# Patient Record
Sex: Male | Born: 1960 | Race: White | Hispanic: No | Marital: Single
Health system: Midwestern US, Community
[De-identification: ages and names within clinical notes are randomized; demographics above are authoritative.]

## PROBLEM LIST (undated history)

## (undated) DIAGNOSIS — M4800 Spinal stenosis, site unspecified: Secondary | ICD-10-CM

## (undated) DIAGNOSIS — G4730 Sleep apnea, unspecified: Secondary | ICD-10-CM

## (undated) DIAGNOSIS — M549 Dorsalgia, unspecified: Secondary | ICD-10-CM

## (undated) DIAGNOSIS — G8929 Other chronic pain: Secondary | ICD-10-CM

## (undated) DIAGNOSIS — B1920 Unspecified viral hepatitis C without hepatic coma: Secondary | ICD-10-CM

## (undated) DIAGNOSIS — I10 Essential (primary) hypertension: Secondary | ICD-10-CM

## (undated) DIAGNOSIS — F419 Anxiety disorder, unspecified: Secondary | ICD-10-CM

## (undated) DIAGNOSIS — E119 Type 2 diabetes mellitus without complications: Secondary | ICD-10-CM

## (undated) DIAGNOSIS — M419 Scoliosis, unspecified: Secondary | ICD-10-CM

## (undated) DIAGNOSIS — M5430 Sciatica, unspecified side: Secondary | ICD-10-CM

## (undated) DIAGNOSIS — M25519 Pain in unspecified shoulder: Secondary | ICD-10-CM

## (undated) HISTORY — DX: Type 2 diabetes mellitus without complications: E11.9

## (undated) HISTORY — DX: Essential (primary) hypertension: I10

## (undated) HISTORY — PX: KIDNEY STONE SURGERY: SHX686

## (undated) HISTORY — DX: Unspecified viral hepatitis C without hepatic coma: B19.20

---

## 2001-06-24 ENCOUNTER — Emergency Department (HOSPITAL_COMMUNITY): Admission: EM | Admit: 2001-06-24 | Discharge: 2001-06-24 | Payer: Self-pay | Admitting: Emergency Medicine

## 2001-07-14 ENCOUNTER — Emergency Department (HOSPITAL_COMMUNITY): Admission: EM | Admit: 2001-07-14 | Discharge: 2001-07-14 | Payer: Self-pay | Admitting: *Deleted

## 2004-05-18 ENCOUNTER — Inpatient Hospital Stay (HOSPITAL_COMMUNITY): Admission: EM | Admit: 2004-05-18 | Discharge: 2004-05-20 | Payer: Self-pay | Admitting: Cardiology

## 2005-03-22 ENCOUNTER — Emergency Department (HOSPITAL_COMMUNITY): Admission: EM | Admit: 2005-03-22 | Discharge: 2005-03-23 | Payer: Self-pay | Admitting: Emergency Medicine

## 2005-08-06 ENCOUNTER — Inpatient Hospital Stay (HOSPITAL_COMMUNITY): Admission: AD | Admit: 2005-08-06 | Discharge: 2005-08-07 | Payer: Self-pay | Admitting: Family Medicine

## 2012-02-26 ENCOUNTER — Ambulatory Visit (HOSPITAL_COMMUNITY)
Admission: RE | Admit: 2012-02-26 | Discharge: 2012-02-26 | Disposition: A | Discharge status: Home / Self Care | Payer: PRIVATE HEALTH INSURANCE | Source: Ambulatory Visit | Attending: Family Medicine | Admitting: Family Medicine

## 2012-02-26 ENCOUNTER — Other Ambulatory Visit (HOSPITAL_COMMUNITY): Payer: Self-pay | Admitting: Family Medicine

## 2012-02-26 DIAGNOSIS — R5381 Other malaise: Secondary | ICD-10-CM | POA: Insufficient documentation

## 2012-02-26 DIAGNOSIS — R5383 Other fatigue: Secondary | ICD-10-CM | POA: Insufficient documentation

## 2012-02-26 DIAGNOSIS — R0602 Shortness of breath: Secondary | ICD-10-CM | POA: Insufficient documentation

## 2012-02-26 DIAGNOSIS — I10 Essential (primary) hypertension: Secondary | ICD-10-CM | POA: Insufficient documentation

## 2012-02-26 DIAGNOSIS — E119 Type 2 diabetes mellitus without complications: Secondary | ICD-10-CM | POA: Insufficient documentation

## 2012-04-10 ENCOUNTER — Encounter (INDEPENDENT_AMBULATORY_CARE_PROVIDER_SITE_OTHER): Payer: Self-pay | Admitting: *Deleted

## 2012-05-05 ENCOUNTER — Ambulatory Visit (INDEPENDENT_AMBULATORY_CARE_PROVIDER_SITE_OTHER): Payer: PRIVATE HEALTH INSURANCE | Admitting: Internal Medicine

## 2012-05-05 ENCOUNTER — Encounter (INDEPENDENT_AMBULATORY_CARE_PROVIDER_SITE_OTHER): Payer: Self-pay | Admitting: Internal Medicine

## 2012-05-05 VITALS — BP 140/90 | HR 80 | Temp 97.3°F | Resp 20 | Ht 70.0 in | Wt 257.9 lb

## 2012-05-05 DIAGNOSIS — B182 Chronic viral hepatitis C: Secondary | ICD-10-CM

## 2012-05-05 DIAGNOSIS — E119 Type 2 diabetes mellitus without complications: Secondary | ICD-10-CM | POA: Insufficient documentation

## 2012-05-05 DIAGNOSIS — E669 Obesity, unspecified: Secondary | ICD-10-CM

## 2012-05-05 DIAGNOSIS — G8929 Other chronic pain: Secondary | ICD-10-CM | POA: Insufficient documentation

## 2012-05-05 DIAGNOSIS — I10 Essential (primary) hypertension: Secondary | ICD-10-CM | POA: Insufficient documentation

## 2012-05-05 NOTE — Patient Instructions (Addendum)
Take naproxen on as-needed basis. Physician will contact you when ultrasound completed. You must exercise regularly and try to lose weight at a rate of 2-3 pounds per month. Try to bring your weight down to 220 pounds Dr. Sudie Bailey to arrange hepatitis A and B. Vaccination.

## 2012-05-05 NOTE — Progress Notes (Signed)
Presenting complaint;  Chronic hepatitis C. Patient interested in further evaluation and treatment.  History of present illness;  Gregory Duran is 51 year old Caucasian male who is referred through courtesy of Dr. Sudie Bailey for evaluation of chronic hepatitis C. This condition was diagnosed about 6 years ago when he was in rehabilitation for alcohol abuse. He is never experienced jaundice. He had single tattoo placed over his right palm at age 33. He also used drugs over a period of 6 months and he was in early 83s. He feels fine. He has very good appetite. He has gained 10 pounds in the last 5 months since he started his new job. He denies fatigue or weakness. He has intermittent gnawing abdominal pain which she describes as hunger pain. He denies nausea, vomiting heartburn or melena. He has occasional hematochezia secondary to hemorrhoids. He is presently not sexually active. He states he loves new job and works 6-7 days each week. He has some concerns about cost of treatment since his deductible is $4000.   Current Medications: Current Outpatient Prescriptions  Medication Sig Dispense Refill  . aspirin 81 MG tablet Take 81 mg by mouth daily.      Marland Kitchen glipiZIDE (GLUCOTROL) 10 MG tablet Take 10 mg by mouth. 1/2 tablet by mouth twice  daily      . lisinopril-hydrochlorothiazide (PRINZIDE,ZESTORETIC) 20-12.5 MG per tablet Take 1 tablet by mouth daily.      . Multiple Vitamins-Minerals (GNP MEGA MULTI FOR MEN PO) Take by mouth. Patient takes 2 a day      . naproxen (NAPROSYN) 500 MG tablet Take 500 mg by mouth 2 (two) times daily with a meal.      . oxyCODONE-acetaminophen (PERCOCET) 7.5-325 MG per tablet Take 2 tablets by mouth daily as needed.      Marland Kitchen POTASSIUM GLUCONATE PO Take 99 mg by mouth.      . predniSONE (DELTASONE) 10 MG tablet Take 10 mg by mouth as needed.       Past medical history; Hypertensive for 22 years. History of alcohol abuse. He was in rehabilitation few years ago.  Diabetes  mellitus. He's been on medications for 6 years. Obesity. Chronic low back pain/sciatica. History of kidney stone removed via cystoscopy 5 years ago. Allergies; NKA. Family history; Both patients are disease mother died of MI at age 4; father lived to be 5. He had CAD hypertension and glucose intolerance. He has 3 brothers and they're all hypertensive. One sister in good health. Social history; He is divorced. He does not have any children. He drank sessile amount of alcohol for years but none in last 3-1/2 years. He does not smoke cigarettes. 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   Objective: Blood pressure 140/90, pulse 80, temperature 97.3 F (36.3 C), temperature source Oral, resp. rate 20, height 5\' 10"  (1.778 m), weight 257 lb 14.4 oz (116.983 kg). Patient is alert and in no acute distress. He does not have asterixis. Conjunctiva is pink. Sclera is nonicteric Oropharyngeal mucosa is normal. No neck masses or thyromegaly noted. Cardiac exam with regular rhythm normal S1 and S2. No murmur or gallop noted. Lungs are clear to auscultation. Abdomen is obese. Abdomen is soft and nontender without organomegaly or masses. Liver by percussion is 15-16 cm.  No LE edema or clubbing noted. He has tattoo over his right arm.  Labs/studies Results: Lab data from 02/27/2012. WBC 10.3, H&H 11.8 and 33.8,  platelet count 259K Bilirubin 0.6, AP 129, AST 39, ALT 49 albumin 3.9. BUN 21, glucose 264. Hemoglobin A1c 6.9  Assessment:  Patient is 51 year old Caucasian male who has  chronic hepatitis C. He does not have stigmata of chronic liver disease. His other risk factor for liver disease includes obesity and possibly fatty liver. He needs to improve control of his diabetes and needs to gradually lose weight. He will need liver biopsy prior to initiating antiviral therapy. We also discussed low risk of transmission during  sexual activity and need for mechanical barrier during sexual activity.   Recommendations;   Hepatobiliary ultrasound to be scheduled in November 2013 per patient's request. Patient advised he must make an effort to exercise regularly and lose weight. Patient also advised to use naproxen on as-needed basis. Patient also advised to use condoms for sexual activity and should make his partner aware that he has chronic hepatitis. He will talk with Dr. Sudie Bailey about getting hepatitis A and B. vaccination.

## 2012-06-06 ENCOUNTER — Encounter (INDEPENDENT_AMBULATORY_CARE_PROVIDER_SITE_OTHER): Payer: Self-pay

## 2012-07-29 ENCOUNTER — Encounter (INDEPENDENT_AMBULATORY_CARE_PROVIDER_SITE_OTHER): Payer: Self-pay | Admitting: *Deleted

## 2012-09-17 ENCOUNTER — Telehealth (INDEPENDENT_AMBULATORY_CARE_PROVIDER_SITE_OTHER): Payer: Self-pay | Admitting: *Deleted

## 2012-09-17 NOTE — Telephone Encounter (Signed)
Mr.Mcdanel called and has requested that he be called regarding a U/S that Dr.Rehman wanted him to have done. He may be contacted at [phone]

## 2012-09-18 ENCOUNTER — Other Ambulatory Visit (INDEPENDENT_AMBULATORY_CARE_PROVIDER_SITE_OTHER): Payer: Self-pay | Admitting: Internal Medicine

## 2012-09-18 DIAGNOSIS — B1920 Unspecified viral hepatitis C without hepatic coma: Secondary | ICD-10-CM

## 2012-09-18 NOTE — Telephone Encounter (Signed)
Spoke to patient, ha wanted [place] sch'd at [facility] for financial reasons, [place] sch'd 10/17/12 @ 745, npo after midnight @ [address] location, patient aware

## 2012-10-17 ENCOUNTER — Ambulatory Visit
Admission: RE | Admit: 2012-10-17 | Discharge: 2012-10-17 | Disposition: A | Discharge status: Home / Self Care | Payer: No Typology Code available for payment source | Source: Ambulatory Visit | Attending: Internal Medicine | Admitting: Internal Medicine

## 2012-10-17 DIAGNOSIS — B1920 Unspecified viral hepatitis C without hepatic coma: Secondary | ICD-10-CM

## 2012-11-03 ENCOUNTER — Ambulatory Visit (INDEPENDENT_AMBULATORY_CARE_PROVIDER_SITE_OTHER): Payer: PRIVATE HEALTH INSURANCE | Admitting: Internal Medicine

## 2013-03-24 ENCOUNTER — Encounter (INDEPENDENT_AMBULATORY_CARE_PROVIDER_SITE_OTHER): Payer: Self-pay | Admitting: *Deleted

## 2013-12-18 ENCOUNTER — Emergency Department (INDEPENDENT_AMBULATORY_CARE_PROVIDER_SITE_OTHER)
Admission: EM | Admit: 2013-12-18 | Discharge: 2013-12-18 | Disposition: A | Discharge status: Home / Self Care | Payer: PRIVATE HEALTH INSURANCE | Source: Home / Self Care | Attending: Family Medicine | Admitting: Family Medicine

## 2013-12-18 ENCOUNTER — Encounter (HOSPITAL_COMMUNITY): Payer: Self-pay | Admitting: Emergency Medicine

## 2013-12-18 DIAGNOSIS — M545 Low back pain, unspecified: Secondary | ICD-10-CM

## 2013-12-18 DIAGNOSIS — G8929 Other chronic pain: Secondary | ICD-10-CM

## 2013-12-18 MED ORDER — KETOROLAC TROMETHAMINE 30 MG/ML IJ SOLN
30.0000 mg | Freq: Once | INTRAMUSCULAR | Status: AC
  Administered 2013-12-18: 30 mg via INTRAMUSCULAR

## 2013-12-18 MED ORDER — DICLOFENAC SODIUM 75 MG PO TBEC: 75.0000 mg | DELAYED_RELEASE_TABLET | Freq: Two times a day (BID) | ORAL | Status: DC | PRN

## 2013-12-18 MED ORDER — OXYCODONE-ACETAMINOPHEN 5-325 MG PO TABS: 1.0000 | ORAL_TABLET | Freq: Three times a day (TID) | ORAL | Status: DC | PRN

## 2013-12-18 MED ORDER — KETOROLAC TROMETHAMINE 30 MG/ML IJ SOLN
INTRAMUSCULAR | Status: AC
  Filled 2013-12-18: qty 1

## 2013-12-18 NOTE — ED Notes (Signed)
Pt c/o chronic back pain that started today Hx of sciatic right side pain Pain is constant and increases w/activity PCP in [place], [place].... Has ran out of his oxycodone Voices no other sxs/concerns Alert w/no signs of acute distress.

## 2013-12-18 NOTE — ED Provider Notes (Signed)
CSN: [id number]     Arrival date & time 12/18/13  1444 History   First MD Initiated Contact with Patient 12/18/13 1614     Chief Complaint  Patient presents with  . Back Pain   (Consider location/radiation/quality/duration/timing/severity/associated sxs/prior Treatment) HPI Comments: Patient states he has had chronic lower back pain since 2012 and that this has been managed by his PCP, Dr. Katharine LookSteven Knowlton in [place], [place]. Reports that he has been staying in the [place] area while a family member has been an inpatient at [facility] and he has run out of his percocet that he uses on a daily basis for pain management. [facility] requesting Rx for percocet.  Denies fever, hematuria, dysuria, bowel or bladder incontinence.  States pain in right lower back radiates to his right thigh and foot. No changes in strenght or sensation of either extremity. No saddle anesthesia  The history is provided by the patient.    Past Medical History  Diagnosis Date  . Hepatitis C   . Hypertension   . Diabetes mellitus    Past Surgical History  Procedure Laterality Date  . Kidney stone surgery     Family History  Problem Relation Age of Onset  . Heart disease Mother   . Heart disease Father   . Healthy Sister   . Hypertension Brother   . Hypertension Brother   . Hypertension Brother   . Diabetes Brother    History  Substance Use Topics  . Smoking status: Never Smoker   . Smokeless tobacco: Never Used  . Alcohol Use: No     Comment: Patient states that he quit 3 1/2 years ago. Prior to thathe drank for a very long time.    Review of Systems  Constitutional: Negative.   HENT: Negative.   Eyes: Negative.   Respiratory: Negative.   Cardiovascular: Negative.   Gastrointestinal: Negative.   Endocrine: Negative for polydipsia, polyphagia and polyuria.  Genitourinary: Negative.   Musculoskeletal: Positive for back pain.  Skin: Negative.     Allergies  Review of patient's allergies indicates  no known allergies.  Home Medications   Current Outpatient Rx  Name  Route  Sig  Dispense  Refill  . glipiZIDE (GLUCOTROL) 10 MG tablet   Oral   Take 10 mg by mouth. 1/2 tablet by mouth twice  daily         . lisinopril-hydrochlorothiazide (PRINZIDE,ZESTORETIC) 20-12.5 MG per tablet   Oral   Take 1 tablet by mouth daily.         Marland Kitchen. aspirin 81 MG tablet   Oral   Take 81 mg by mouth daily.         . diclofenac (VOLTAREN) 75 MG EC tablet   Oral   Take 1 tablet (75 mg total) by mouth 2 (two) times daily as needed for mild pain or moderate pain.   30 tablet   0   . Multiple Vitamins-Minerals (GNP MEGA MULTI FOR MEN PO)   Oral   Take by mouth. Patient takes 2 a day         . naproxen (NAPROSYN) 500 MG tablet   Oral   Take 500 mg by mouth 2 (two) times daily after a meal.          . oxyCODONE-acetaminophen (PERCOCET) 7.5-325 MG per tablet   Oral   Take 2 tablets by mouth daily as needed.         Marland Kitchen. oxyCODONE-acetaminophen (PERCOCET/ROXICET) 5-325 MG per tablet  Oral   Take 1 tablet by mouth every 8 (eight) hours as needed for severe pain.   15 tablet   0   . POTASSIUM GLUCONATE PO   Oral   Take 99 mg by mouth.         . predniSONE (DELTASONE) 10 MG tablet   Oral   Take 10 mg by mouth as needed.          BP 131/85  Pulse 90  Temp(Src) 97.8 F (36.6 C) (Oral)  Resp 18  SpO2 97% Physical Exam  Nursing note and vitals reviewed. Constitutional: He is oriented to person, place, and time. He appears well-developed and well-nourished. No distress.  +ambulatory  HENT:  Head: Normocephalic and atraumatic.  Eyes: Conjunctivae are normal.  Cardiovascular: Normal rate, regular rhythm and normal heart sounds.   Pulmonary/Chest: Effort normal and breath sounds normal.  Abdominal: Soft. Bowel sounds are normal. He exhibits no distension. There is no tenderness.  Musculoskeletal: Normal range of motion.       Lumbar back: He exhibits tenderness. He exhibits  normal range of motion, no bony tenderness, no swelling, no edema, no deformity and no laceration.       Back:  Neurological: He is alert and oriented to person, place, and time. He has normal strength. No sensory deficit. Gait normal.  Reflex Scores:      Patellar reflexes are 2+ on the right side and 2+ on the left side.      Achilles reflexes are 2+ on the right side and 2+ on the left side. Great toe dorsiflexion 5/5 bilaterally  Skin: Skin is warm and dry. No rash noted.  Psychiatric: He has a normal mood and affect. His behavior is normal.    ED Course  Procedures (including critical care time) Labs Review Labs Reviewed - No data to display Imaging Review No results found.   MDM   1. Chronic low back pain    No clinical indication of acute neurological deficit associated with patient's back pain. Advised his that he will need to follow up with his PCP for additional long term management and that he may wish to consider having his PCP provide subspecialty referral for additional therapeutic options.   Jess Barters [place], [place] 12/19/13 1318

## 2013-12-20 NOTE — ED Provider Notes (Signed)
Medical screening examination/treatment/procedure(s) were performed by resident physician or non-physician practitioner and as supervising physician I was immediately available for consultation/collaboration.   Layn Kye DOUGLAS MD.   Kazmir Oki D Jamesia Linnen, MD 12/20/13 1021 

## 2014-01-12 ENCOUNTER — Encounter (HOSPITAL_COMMUNITY): Payer: Self-pay | Admitting: Emergency Medicine

## 2014-01-12 ENCOUNTER — Emergency Department (HOSPITAL_COMMUNITY)
Admission: EM | Admit: 2014-01-12 | Discharge: 2014-01-12 | Disposition: A | Discharge status: Home / Self Care | Payer: No Typology Code available for payment source | Attending: Emergency Medicine | Admitting: Emergency Medicine

## 2014-01-12 DIAGNOSIS — Z8619 Personal history of other infectious and parasitic diseases: Secondary | ICD-10-CM | POA: Insufficient documentation

## 2014-01-12 DIAGNOSIS — M5430 Sciatica, unspecified side: Secondary | ICD-10-CM | POA: Insufficient documentation

## 2014-01-12 DIAGNOSIS — E119 Type 2 diabetes mellitus without complications: Secondary | ICD-10-CM | POA: Insufficient documentation

## 2014-01-12 DIAGNOSIS — Z79899 Other long term (current) drug therapy: Secondary | ICD-10-CM | POA: Insufficient documentation

## 2014-01-12 DIAGNOSIS — I10 Essential (primary) hypertension: Secondary | ICD-10-CM | POA: Insufficient documentation

## 2014-01-12 DIAGNOSIS — R52 Pain, unspecified: Secondary | ICD-10-CM | POA: Insufficient documentation

## 2014-01-12 DIAGNOSIS — G8929 Other chronic pain: Secondary | ICD-10-CM | POA: Insufficient documentation

## 2014-01-12 MED ORDER — CYCLOBENZAPRINE HCL 10 MG PO TABS: 10.0000 mg | ORAL_TABLET | Freq: Three times a day (TID) | ORAL | Status: DC | PRN

## 2014-01-12 MED ORDER — OXYCODONE-ACETAMINOPHEN 7.5-325 MG PO TABS: 1.0000 | ORAL_TABLET | ORAL | Status: DC | PRN

## 2014-01-12 MED ORDER — PREDNISONE 10 MG PO TABS: ORAL_TABLET | ORAL | Status: DC

## 2014-01-12 MED ORDER — OXYCODONE-ACETAMINOPHEN 5-325 MG PO TABS: 1.0000 | ORAL_TABLET | Freq: Once | ORAL | Status: DC

## 2014-01-12 MED ORDER — OXYCODONE-ACETAMINOPHEN 5-325 MG PO TABS
2.0000 | ORAL_TABLET | Freq: Once | ORAL | Status: AC
  Administered 2014-01-12: 2 via ORAL
  Filled 2014-01-12: qty 2

## 2014-01-12 NOTE — ED Notes (Addendum)
Back pain,chronic, [name] recently cut back on  Medication for pain , Pt called  [name]'s office and advised him to come to [facility]  Since the office was so busy.

## 2014-01-12 NOTE — Discharge Instructions (Signed)
Sciatica °Sciatica is pain, weakness, numbness, or tingling along your sciatic nerve. The nerve starts in the lower back and runs down the back of each leg. Nerve damage or certain conditions pinch or put pressure on the sciatic nerve. This causes the pain, weakness, and other discomforts of sciatica. °HOME CARE  °· Only take medicine as told by your doctor. °· Apply ice to the affected area for 20 minutes. Do this 3 4 times a day for the first 48 72 hours. Then try heat in the same way. °· Exercise, stretch, or do your usual activities if these do not make your pain worse. °· Go to physical therapy as told by your doctor. °· Keep all doctor visits as told. °· Do not wear high heels or shoes that are not supportive. °· Get a firm mattress if your mattress is too soft to lessen pain and discomfort. °GET HELP RIGHT AWAY IF:  °· You cannot control when you poop (bowel movement) or pee (urinate). °· You have more weakness in your lower back, lower belly (pelvis), butt (buttocks), or legs. °· You have redness or puffiness (swelling) of your back. °· You have a burning feeling when you pee. °· You have pain that gets worse when you lie down. °· You have pain that wakes you from your sleep. °· Your pain is worse than past pain. °· Your pain lasts longer than 4 weeks. °· You are suddenly losing weight without reason. °MAKE SURE YOU:  °· Understand these instructions. °· Will watch this condition. °· Will get help right away if you are not doing well or get worse. °Document Released: 06/26/2008 Document Revised: 03/18/2012 Document Reviewed: 01/27/2012 °ExitCare® Patient Information ©2014 [employer]. ° °

## 2014-01-12 NOTE — ED Provider Notes (Signed)
CSN: [id number]     Arrival date & time 01/12/14  1216 History   First MD Initiated Contact with Patient 01/12/14 1305     Chief Complaint  Patient presents with  . Back Pain     (Consider location/radiation/quality/duration/timing/severity/associated sxs/prior Treatment) Patient is a 53 y.o. male presenting with back pain. The history is provided by the patient.  Back Pain Location:  Lumbar spine Quality:  Aching Radiates to:  R posterior upper leg and R thigh Pain severity:  Severe Pain is:  Same all the time Onset quality:  Gradual Duration: acute on chronic low back pain that worsened since yesterday. Timing:  Constant Progression:  Worsening Chronicity:  Chronic Context: twisting   Context: not falling, not lifting heavy objects and not recent injury   Relieved by:  Bed rest Worsened by:  Twisting, standing, sitting and bending Ineffective treatments:  None tried Associated symptoms: leg pain   Associated symptoms: no abdominal pain, no abdominal swelling, no bladder incontinence, no bowel incontinence, no chest pain, no dysuria, no fever, no headaches, no numbness, no paresthesias, no pelvic pain, no perianal numbness, no tingling and no weakness     Past Medical History  Diagnosis Date  . Hepatitis C   . Hypertension   . Diabetes mellitus    Past Surgical History  Procedure Laterality Date  . Kidney stone surgery     Family History  Problem Relation Age of Onset  . Heart disease Mother   . Heart disease Father   . Healthy Sister   . Hypertension Brother   . Hypertension Brother   . Hypertension Brother   . Diabetes Brother    History  Substance Use Topics  . Smoking status: Never Smoker   . Smokeless tobacco: Never Used  . Alcohol Use: No     Comment: Patient states that he quit 3 1/2 years ago. Prior to thathe drank for a very long time.    Review of Systems  Constitutional: Negative for fever.  Respiratory: Negative for shortness of breath.    Cardiovascular: Negative for chest pain.  Gastrointestinal: Negative for vomiting, abdominal pain, constipation and bowel incontinence.  Genitourinary: Negative for bladder incontinence, dysuria, hematuria, flank pain, decreased urine volume, difficulty urinating and pelvic pain.       No perineal numbness or incontinence of urine or feces  Musculoskeletal: Positive for back pain. Negative for joint swelling.  Skin: Negative for rash.  Neurological: Negative for tingling, weakness, numbness, headaches and paresthesias.  All other systems reviewed and are negative.     Allergies  Review of patient's allergies indicates no known allergies.  Home Medications   Prior to Admission medications   Medication Sig Start Date End Date Taking? Authorizing Provider  lisinopril-hydrochlorothiazide (PRINZIDE,ZESTORETIC) 20-25 MG per tablet Take 1 tablet by mouth daily.   Yes Historical Provider, MD  metFORMIN (GLUCOPHAGE) 500 MG tablet Take 500 mg by mouth 2 (two) times daily with a meal.   Yes Historical Provider, MD   BP 120/77  Pulse 111  Temp(Src) 97.7 F (36.5 C) (Oral)  Resp 18  SpO2 100% Physical Exam  Nursing note and vitals reviewed. Constitutional: He is oriented to person, place, and time. He appears well-developed and well-nourished. No distress.  HENT:  Head: Normocephalic and atraumatic.  Neck: Normal range of motion. Neck supple.  Cardiovascular: Normal rate, regular rhythm, normal heart sounds and intact distal pulses.   No murmur heard. Pulmonary/Chest: Effort normal and breath sounds normal. No respiratory distress.  Abdominal: Soft. He exhibits no distension. There is no tenderness.  Musculoskeletal: He exhibits tenderness. He exhibits no edema.       Lumbar back: He exhibits tenderness and pain. He exhibits normal range of motion, no swelling, no deformity, no laceration and normal pulse.  ttp of the right lumbar paraspinal muscles.  No spinal tenderness.  DP pulses are  brisk and symmetrical.  Distal sensation intact.  Hip Flexors/Extensors are intact.  Pt has normal strength against resistance of bilateral lower extremities.     Neurological: He is alert and oriented to person, place, and time. He has normal strength. No sensory deficit. He exhibits normal muscle tone. Coordination and gait normal.  Reflex Scores:      Patellar reflexes are 2+ on the right side and 2+ on the left side.      Achilles reflexes are 2+ on the right side and 2+ on the left side. Skin: Skin is warm and dry. No rash noted.    ED Course  Procedures (including critical care time) Labs Review Labs Reviewed - No data to display  Imaging Review No results found.   EKG Interpretation None      MDM   Final diagnoses:  Sciatica   Patient with acute on chronic low back pain.  No concerning sx's for emergent neurological or infectious process.  No hx of trauma to indicate need for imaging at this time    pateint reviewed on the [place] narcotic database.  He been receiving large qty of oxcodone per month from PMD.  Only received #15 percocet on 12/18/13 from different provider.  He agrees to close f/u with his PMD and advised that ED is not proper location for chronic pain management.  Pt verbalized understanding  Gregory Duran L. Gregory Mangleriplett, PA-C 01/14/14 1802

## 2014-01-12 NOTE — ED Notes (Signed)
Pt c/o chronic lower back pain that flared up yesterday.  Reports pain radiates down r leg.  Denies injury.

## 2014-01-18 NOTE — ED Provider Notes (Signed)
Medical screening examination/treatment/procedure(s) were performed by non-physician practitioner and as supervising physician I was immediately available for consultation/collaboration.   EKG Interpretation None       Kweku Stankey, MD 01/18/14 0755 

## 2014-04-15 ENCOUNTER — Encounter (HOSPITAL_COMMUNITY): Payer: Self-pay | Admitting: Emergency Medicine

## 2014-04-15 ENCOUNTER — Emergency Department (HOSPITAL_COMMUNITY)
Admission: EM | Admit: 2014-04-15 | Discharge: 2014-04-15 | Disposition: A | Discharge status: Home / Self Care | Payer: No Typology Code available for payment source | Attending: Emergency Medicine | Admitting: Emergency Medicine

## 2014-04-15 DIAGNOSIS — I10 Essential (primary) hypertension: Secondary | ICD-10-CM | POA: Insufficient documentation

## 2014-04-15 DIAGNOSIS — Z79899 Other long term (current) drug therapy: Secondary | ICD-10-CM | POA: Diagnosis not present

## 2014-04-15 DIAGNOSIS — IMO0002 Reserved for concepts with insufficient information to code with codable children: Secondary | ICD-10-CM | POA: Insufficient documentation

## 2014-04-15 DIAGNOSIS — M5430 Sciatica, unspecified side: Secondary | ICD-10-CM | POA: Diagnosis not present

## 2014-04-15 DIAGNOSIS — E119 Type 2 diabetes mellitus without complications: Secondary | ICD-10-CM | POA: Diagnosis not present

## 2014-04-15 DIAGNOSIS — R1110 Vomiting, unspecified: Secondary | ICD-10-CM | POA: Diagnosis present

## 2014-04-15 DIAGNOSIS — Z8619 Personal history of other infectious and parasitic diseases: Secondary | ICD-10-CM | POA: Diagnosis not present

## 2014-04-15 DIAGNOSIS — K5289 Other specified noninfective gastroenteritis and colitis: Secondary | ICD-10-CM | POA: Diagnosis not present

## 2014-04-15 DIAGNOSIS — K529 Noninfective gastroenteritis and colitis, unspecified: Secondary | ICD-10-CM

## 2014-04-15 DIAGNOSIS — M5441 Lumbago with sciatica, right side: Secondary | ICD-10-CM

## 2014-04-15 LAB — COMPREHENSIVE METABOLIC PANEL WITH GFR
ALT: 57 U/L — ABNORMAL HIGH (ref 0–53)
AST: 40 U/L — ABNORMAL HIGH (ref 0–37)
Albumin: 3.9 g/dL (ref 3.5–5.2)
Alkaline Phosphatase: 85 U/L (ref 39–117)
Anion gap: 12 (ref 5–15)
BUN: 13 mg/dL (ref 6–23)
CO2: 27 meq/L (ref 19–32)
Calcium: 9.7 mg/dL (ref 8.4–10.5)
Chloride: 98 meq/L (ref 96–112)
Creatinine, Ser: 0.81 mg/dL (ref 0.50–1.35)
GFR calc Af Amer: >90 mL/min (ref >90)
GFR calc non Af Amer: >90 mL/min (ref >90)
Glucose, Bld: 234 mg/dL — ABNORMAL HIGH (ref 70–99)
Potassium: 4.2 meq/L (ref 3.7–5.3)
Sodium: 137 meq/L (ref 137–147)
Total Bilirubin: 0.7 mg/dL (ref 0.3–1.2)
Total Protein: 7.3 g/dL (ref 6.0–8.3)

## 2014-04-15 LAB — URINALYSIS, ROUTINE W REFLEX MICROSCOPIC
Bilirubin Urine: NEGATIVE
Glucose, UA: 500 mg/dL — AB
Hgb urine dipstick: NEGATIVE
Ketones, ur: NEGATIVE mg/dL
Leukocytes, UA: NEGATIVE
Nitrite: NEGATIVE
Protein, ur: NEGATIVE mg/dL
Specific Gravity, Urine: 1.02 (ref 1.005–1.030)
Urobilinogen, UA: 0.2 mg/dL (ref 0.0–1.0)
pH: 5.5 (ref 5.0–8.0)

## 2014-04-15 LAB — CBC WITH DIFFERENTIAL/PLATELET
Basophils Absolute: 0 K/uL (ref 0.0–0.1)
Basophils Relative: 0 % (ref 0–1)
Eosinophils Absolute: 0.2 K/uL (ref 0.0–0.7)
Eosinophils Relative: 2 % (ref 0–5)
HCT: 44.9 % (ref 39.0–52.0)
Hemoglobin: 15.9 g/dL (ref 13.0–17.0)
Lymphocytes Relative: 27 % (ref 12–46)
Lymphs Abs: 2 K/uL (ref 0.7–4.0)
MCH: 31.9 pg (ref 26.0–34.0)
MCHC: 35.4 g/dL (ref 30.0–36.0)
MCV: 90.2 fL (ref 78.0–100.0)
Monocytes Absolute: 0.9 K/uL (ref 0.1–1.0)
Monocytes Relative: 13 % — ABNORMAL HIGH (ref 3–12)
Neutro Abs: 4.3 K/uL (ref 1.7–7.7)
Neutrophils Relative %: 58 % (ref 43–77)
Platelets: 244 K/uL (ref 150–400)
RBC: 4.98 MIL/uL (ref 4.22–5.81)
RDW: 12.9 % (ref 11.5–15.5)
WBC: 7.4 K/uL (ref 4.0–10.5)

## 2014-04-15 MED ORDER — DIPHENOXYLATE-ATROPINE 2.5-0.025 MG PO TABS: 1.0000 | ORAL_TABLET | Freq: Four times a day (QID) | ORAL | Status: DC | PRN

## 2014-04-15 MED ORDER — PROMETHAZINE HCL 25 MG PO TABS: 25.0000 mg | ORAL_TABLET | Freq: Four times a day (QID) | ORAL | Status: DC | PRN

## 2014-04-15 MED ORDER — OXYCODONE-ACETAMINOPHEN 10-325 MG PO TABS: 1.0000 | ORAL_TABLET | ORAL | Status: DC | PRN

## 2014-04-15 NOTE — Discharge Instructions (Signed)
Medication for pain, nausea, diarrhea.  Increase fluids.

## 2014-04-15 NOTE — ED Provider Notes (Signed)
CSN: [id number]     Arrival date & time 04/15/14  1531 History  This chart was scribed for Donnetta Hutching, MD by Milly Jakob, ED Scribe. The patient was seen in room APA16A/APA16A. Patient's care was started at 5:18 PM.      Chief Complaint  Patient presents with  . Emesis  . Diarrhea   HPI HPI Comments: Gregory Duran is a 53 y.o. male who presents to the Emergency Department complaining of gradually improving, nausea, vomiting, and diarrhea onset 5 days ago. He reports that his stomach is not bothering him currently after taking Pepto Bismol. He reports seeing Dr. Metta Clines last Friday and having a normal appointment. He reports that he ate [place] food last Saturday before he started feeling poorly, and expresses a concern that this could be food poisoning. Patient also reports pain that begins in his lower back and radiates into  the top of his right foot due to a pinched sciatic nerve. He reports that this is flaring up due to the amount of time he has been sitting. He reports being prescribed Oxycodone 10mg  for this pain in the past and Prednisone. He expresses a desire to go back to work in 3 days.   Past Medical History  Diagnosis Date  . Hepatitis C   . Hypertension   . Diabetes mellitus    Past Surgical History  Procedure Laterality Date  . Kidney stone surgery     Family History  Problem Relation Age of Onset  . Heart disease Mother   . Heart disease Father   . Healthy Sister   . Hypertension Brother   . Hypertension Brother   . Hypertension Brother   . Diabetes Brother    History  Substance Use Topics  . Smoking status: Never Smoker   . Smokeless tobacco: Never Used  . Alcohol Use: No     Comment: Patient states that he quit 3 1/2 years ago. Prior to thathe drank for a very long time.    Review of Systems  A complete 10 system review of systems was obtained and all systems are negative except as noted in the HPI and PMH.    Allergies  Review of patient's  allergies indicates no known allergies.  Home Medications   Prior to Admission medications   Medication Sig Start Date End Date Taking? Authorizing Provider  cyclobenzaprine (FLEXERIL) 10 MG tablet Take 1 tablet (10 mg total) by mouth 3 (three) times daily as needed. 01/12/14   Tammy L. Triplett, PA-C  diphenoxylate-atropine (LOMOTIL) 2.5-0.025 MG per tablet Take 1 tablet by mouth 4 (four) times daily as needed for diarrhea or loose stools. 04/15/14   Donnetta Hutching, MD  lisinopril-hydrochlorothiazide (PRINZIDE,ZESTORETIC) 20-25 MG per tablet Take 1 tablet by mouth daily.    Historical Provider, MD  metFORMIN (GLUCOPHAGE) 500 MG tablet Take 500 mg by mouth 2 (two) times daily with a meal.    Historical Provider, MD  oxyCODONE-acetaminophen (PERCOCET) 10-325 MG per tablet Take 1 tablet by mouth every 4 (four) hours as needed for pain. 04/15/14   Donnetta Hutching, MD  oxyCODONE-acetaminophen (PERCOCET) 7.5-325 MG per tablet Take 1 tablet by mouth every 4 (four) hours as needed for pain. 01/12/14   Tammy L. Triplett, PA-C  predniSONE (DELTASONE) 10 MG tablet Take 6 tablets day one, 5 tablets day two, 4 tablets day three, 3 tablets day four, 2 tablets day five, then 1 tablet day six 01/12/14   Tammy L. Triplett, PA-C  promethazine (PHENERGAN)  25 MG tablet Take 1 tablet (25 mg total) by mouth every 6 (six) hours as needed. 04/15/14   Donnetta HutchingBrian Collie Kittel, MD   Triage Vitals: BP 122/87  Pulse 92  Temp(Src) 97.9 F (36.6 C) (Oral)  Resp 18  Ht 5\' 10"  (1.778 m)  Wt 238 lb (107.956 kg)  BMI 34.15 kg/m2  SpO2 97% Physical Exam  Nursing note and vitals reviewed. Constitutional: He is oriented to person, place, and time. He appears well-developed and well-nourished. No distress.  HENT:  Head: Normocephalic and atraumatic.  Eyes: Conjunctivae and EOM are normal.  Neck: Neck supple. No tracheal deviation present.  Cardiovascular: Normal rate.   Pulmonary/Chest: Effort normal. No respiratory distress.  Abdominal: Soft.   Musculoskeletal: Normal range of motion.  Neurological: He is alert and oriented to person, place, and time.  Skin: Skin is warm and dry.  Psychiatric: He has a normal mood and affect. His behavior is normal.    ED Course  Procedures (including critical care time) DIAGNOSTIC STUDIES: Oxygen Saturation is 97% on room air, normal by my interpretation.    COORDINATION OF CARE: 5:24 PM-Discussed treatment plan which includes pain medication and nausea medication with pt at bedside and pt agreed to plan.   Labs Review Labs Reviewed  URINALYSIS, ROUTINE W REFLEX MICROSCOPIC - Abnormal; Notable for the following:    Glucose, UA 500 (*)    All other components within normal limits  CBC WITH DIFFERENTIAL - Abnormal; Notable for the following:    Monocytes Relative 13 (*)    All other components within normal limits  COMPREHENSIVE METABOLIC PANEL - Abnormal; Notable for the following:    Glucose, Bld 234 (*)    AST 40 (*)    ALT 57 (*)    All other components within normal limits    Imaging Review No results found.   EKG Interpretation None      MDM   Final diagnoses:  Gastroenteritis  Right-sided low back pain with right-sided sciatica   Patient is not clinically dehydrated. No acute abdomen. Discharge medications Lomotil, Phenergan 25 mg, percocet 10/325.  Patient has primary care followup   I personally performed the services described in this documentation, which was scribed in my presence. The recorded information has been reviewed and is accurate.     Donnetta HutchingBrian Reyli Schroth, MD 04/15/14 [phone]

## 2014-04-15 NOTE — ED Notes (Signed)
Pt reports n/v/d since Saturday.  Reports took immodium 2 days ago and had one loose stool this morning.  Pt says stool was  Black this morning but reports taking peptobismol yesterday.   Denies fever and abd pain.

## 2015-03-23 ENCOUNTER — Other Ambulatory Visit: Payer: Self-pay | Admitting: Family Medicine

## 2015-03-23 DIAGNOSIS — R319 Hematuria, unspecified: Secondary | ICD-10-CM

## 2015-03-25 ENCOUNTER — Ambulatory Visit
Admission: RE | Admit: 2015-03-25 | Discharge: 2015-03-25 | Disposition: A | Discharge status: Home / Self Care | Payer: No Typology Code available for payment source | Source: Ambulatory Visit | Attending: Family Medicine | Admitting: Family Medicine

## 2015-03-25 DIAGNOSIS — R319 Hematuria, unspecified: Secondary | ICD-10-CM

## 2015-08-16 ENCOUNTER — Other Ambulatory Visit: Payer: Self-pay | Admitting: Family Medicine

## 2015-08-16 DIAGNOSIS — R93429 Abnormal radiologic findings on diagnostic imaging of unspecified kidney: Secondary | ICD-10-CM

## 2015-08-19 ENCOUNTER — Ambulatory Visit
Admission: RE | Admit: 2015-08-19 | Discharge: 2015-08-19 | Disposition: A | Discharge status: Home / Self Care | Payer: No Typology Code available for payment source | Source: Ambulatory Visit | Attending: Family Medicine | Admitting: Family Medicine

## 2015-08-19 DIAGNOSIS — R93429 Abnormal radiologic findings on diagnostic imaging of unspecified kidney: Secondary | ICD-10-CM

## 2015-11-24 ENCOUNTER — Inpatient Hospital Stay (HOSPITAL_COMMUNITY)
Admission: EM | Admit: 2015-11-24 | Discharge: 2015-11-27 | DRG: 378 | Disposition: A | Discharge status: Home / Self Care | Payer: Self-pay | Attending: Internal Medicine | Admitting: Internal Medicine

## 2015-11-24 ENCOUNTER — Encounter (HOSPITAL_COMMUNITY): Payer: Self-pay | Admitting: Emergency Medicine

## 2015-11-24 DIAGNOSIS — K922 Gastrointestinal hemorrhage, unspecified: Secondary | ICD-10-CM | POA: Diagnosis present

## 2015-11-24 DIAGNOSIS — Z8249 Family history of ischemic heart disease and other diseases of the circulatory system: Secondary | ICD-10-CM

## 2015-11-24 DIAGNOSIS — Z7982 Long term (current) use of aspirin: Secondary | ICD-10-CM

## 2015-11-24 DIAGNOSIS — M545 Low back pain: Secondary | ICD-10-CM | POA: Diagnosis present

## 2015-11-24 DIAGNOSIS — Z833 Family history of diabetes mellitus: Secondary | ICD-10-CM

## 2015-11-24 DIAGNOSIS — E118 Type 2 diabetes mellitus with unspecified complications: Secondary | ICD-10-CM | POA: Diagnosis present

## 2015-11-24 DIAGNOSIS — K254 Chronic or unspecified gastric ulcer with hemorrhage: Principal | ICD-10-CM | POA: Diagnosis present

## 2015-11-24 DIAGNOSIS — K529 Noninfective gastroenteritis and colitis, unspecified: Secondary | ICD-10-CM | POA: Diagnosis present

## 2015-11-24 DIAGNOSIS — D72829 Elevated white blood cell count, unspecified: Secondary | ICD-10-CM | POA: Diagnosis present

## 2015-11-24 DIAGNOSIS — B182 Chronic viral hepatitis C: Secondary | ICD-10-CM | POA: Diagnosis present

## 2015-11-24 DIAGNOSIS — Z7984 Long term (current) use of oral hypoglycemic drugs: Secondary | ICD-10-CM

## 2015-11-24 DIAGNOSIS — D62 Acute posthemorrhagic anemia: Secondary | ICD-10-CM | POA: Diagnosis present

## 2015-11-24 DIAGNOSIS — N179 Acute kidney failure, unspecified: Secondary | ICD-10-CM | POA: Diagnosis present

## 2015-11-24 DIAGNOSIS — E878 Other disorders of electrolyte and fluid balance, not elsewhere classified: Secondary | ICD-10-CM | POA: Diagnosis present

## 2015-11-24 DIAGNOSIS — K921 Melena: Secondary | ICD-10-CM | POA: Diagnosis present

## 2015-11-24 DIAGNOSIS — I10 Essential (primary) hypertension: Secondary | ICD-10-CM | POA: Diagnosis present

## 2015-11-24 DIAGNOSIS — K2100 Gastro-esophageal reflux disease with esophagitis, without bleeding: Secondary | ICD-10-CM | POA: Insufficient documentation

## 2015-11-24 DIAGNOSIS — E119 Type 2 diabetes mellitus without complications: Secondary | ICD-10-CM

## 2015-11-24 DIAGNOSIS — G8929 Other chronic pain: Secondary | ICD-10-CM | POA: Diagnosis present

## 2015-11-24 DIAGNOSIS — E871 Hypo-osmolality and hyponatremia: Secondary | ICD-10-CM | POA: Diagnosis present

## 2015-11-24 DIAGNOSIS — K259 Gastric ulcer, unspecified as acute or chronic, without hemorrhage or perforation: Secondary | ICD-10-CM | POA: Diagnosis present

## 2015-11-24 DIAGNOSIS — E860 Dehydration: Secondary | ICD-10-CM | POA: Diagnosis present

## 2015-11-24 LAB — COMPREHENSIVE METABOLIC PANEL
ALT: 62 U/L (ref 17–63)
AST: 44 U/L — ABNORMAL HIGH (ref 15–41)
Albumin: 3.7 g/dL (ref 3.5–5.0)
Alkaline Phosphatase: 53 U/L (ref 38–126)
Anion gap: 14 (ref 5–15)
BUN: 84 mg/dL — ABNORMAL HIGH (ref 6–20)
CO2: 19 mmol/L — ABNORMAL LOW (ref 22–32)
Calcium: 10.8 mg/dL — ABNORMAL HIGH (ref 8.9–10.3)
Chloride: 100 mmol/L — ABNORMAL LOW (ref 101–111)
Creatinine, Ser: 1.66 mg/dL — ABNORMAL HIGH (ref 0.61–1.24)
GFR calc Af Amer: 53 mL/min — ABNORMAL LOW (ref >60)
GFR calc non Af Amer: 46 mL/min — ABNORMAL LOW (ref >60)
Glucose, Bld: 131 mg/dL — ABNORMAL HIGH (ref 65–99)
Potassium: 3.9 mmol/L (ref 3.5–5.1)
Sodium: 133 mmol/L — ABNORMAL LOW (ref 135–145)
Total Bilirubin: 0.8 mg/dL (ref 0.3–1.2)
Total Protein: 6.7 g/dL (ref 6.5–8.1)

## 2015-11-24 LAB — CBC WITH DIFFERENTIAL/PLATELET
Basophils Absolute: 0 10*3/uL (ref 0.0–0.1)
Basophils Relative: 0 %
Eosinophils Absolute: 0.1 10*3/uL (ref 0.0–0.7)
Eosinophils Relative: 1 %
HCT: 30.3 % — ABNORMAL LOW (ref 39.0–52.0)
Hemoglobin: 10.7 g/dL — ABNORMAL LOW (ref 13.0–17.0)
Lymphocytes Relative: 31 %
Lymphs Abs: 3.5 10*3/uL (ref 0.7–4.0)
MCH: 32.1 pg (ref 26.0–34.0)
MCHC: 35.3 g/dL (ref 30.0–36.0)
MCV: 91 fL (ref 78.0–100.0)
Monocytes Absolute: 1 10*3/uL (ref 0.1–1.0)
Monocytes Relative: 9 %
Neutro Abs: 6.6 10*3/uL (ref 1.7–7.7)
Neutrophils Relative %: 59 %
Platelets: 278 10*3/uL (ref 150–400)
RBC: 3.33 MIL/uL — ABNORMAL LOW (ref 4.22–5.81)
RDW: 13.3 % (ref 11.5–15.5)
WBC: 11.2 10*3/uL — ABNORMAL HIGH (ref 4.0–10.5)

## 2015-11-24 LAB — URINALYSIS, ROUTINE W REFLEX MICROSCOPIC
Bilirubin Urine: NEGATIVE
Glucose, UA: NEGATIVE mg/dL
Hgb urine dipstick: NEGATIVE
Ketones, ur: NEGATIVE mg/dL
Leukocytes, UA: NEGATIVE
Nitrite: NEGATIVE
Protein, ur: NEGATIVE mg/dL
Specific Gravity, Urine: 1.01 (ref 1.005–1.030)
pH: 5 (ref 5.0–8.0)

## 2015-11-24 LAB — POC OCCULT BLOOD, ED: Fecal Occult Bld: POSITIVE — AB

## 2015-11-24 MED ORDER — SODIUM CHLORIDE 0.9% FLUSH
3.0000 mL | Freq: Two times a day (BID) | INTRAVENOUS | Status: DC
  Administered 2015-11-24 – 2015-11-26 (×5): 3 mL via INTRAVENOUS

## 2015-11-24 MED ORDER — MORPHINE SULFATE (PF) 4 MG/ML IV SOLN
4.0000 mg | INTRAVENOUS | Status: DC | PRN
  Administered 2015-11-25 (×2): 4 mg via INTRAVENOUS
  Filled 2015-11-24 (×2): qty 1

## 2015-11-24 MED ORDER — PANTOPRAZOLE SODIUM 40 MG IV SOLR
40.0000 mg | Freq: Two times a day (BID) | INTRAVENOUS | Status: DC
  Administered 2015-11-24 – 2015-11-26 (×4): 40 mg via INTRAVENOUS
  Filled 2015-11-24 (×3): qty 40

## 2015-11-24 MED ORDER — ONDANSETRON HCL 4 MG/2ML IJ SOLN: 4.0000 mg | Freq: Four times a day (QID) | INTRAMUSCULAR | Status: DC | PRN

## 2015-11-24 MED ORDER — OXYCODONE HCL 5 MG PO TABS: 10.0000 mg | ORAL_TABLET | ORAL | Status: DC | PRN

## 2015-11-24 MED ORDER — SODIUM CHLORIDE 0.9 % IV BOLUS (SEPSIS)
2000.0000 mL | Freq: Once | INTRAVENOUS | Status: AC
  Administered 2015-11-24: 2000 mL via INTRAVENOUS

## 2015-11-24 MED ORDER — ACETAMINOPHEN 325 MG PO TABS: 650.0000 mg | ORAL_TABLET | Freq: Four times a day (QID) | ORAL | Status: DC | PRN

## 2015-11-24 MED ORDER — MORPHINE SULFATE (PF) 4 MG/ML IV SOLN
6.0000 mg | Freq: Once | INTRAVENOUS | Status: AC
  Administered 2015-11-24: 6 mg via INTRAVENOUS
  Filled 2015-11-24: qty 2

## 2015-11-24 MED ORDER — ONDANSETRON HCL 4 MG PO TABS: 4.0000 mg | ORAL_TABLET | Freq: Four times a day (QID) | ORAL | Status: DC | PRN

## 2015-11-24 MED ORDER — POLYETHYLENE GLYCOL 3350 17 G PO PACK: 17.0000 g | PACK | Freq: Every day | ORAL | Status: DC | PRN

## 2015-11-24 MED ORDER — SODIUM CHLORIDE 0.9 % IV SOLN
INTRAVENOUS | Status: AC
  Administered 2015-11-25: via INTRAVENOUS

## 2015-11-24 MED ORDER — BISACODYL 5 MG PO TBEC: 5.0000 mg | DELAYED_RELEASE_TABLET | Freq: Every day | ORAL | Status: DC | PRN

## 2015-11-24 MED ORDER — ACETAMINOPHEN 650 MG RE SUPP: 650.0000 mg | Freq: Four times a day (QID) | RECTAL | Status: DC | PRN

## 2015-11-24 NOTE — ED Provider Notes (Addendum)
CSN: [id number]     Arrival date & time 11/24/15  1652 History   First MD Initiated Contact with Patient 11/24/15 1710     Chief Complaint  Patient presents with  . Abdominal Pain    no food in 3 days   . Emesis    reports vomited three times     (Consider location/radiation/quality/duration/timing/severity/associated sxs/prior Treatment) HPI Complains of generalized weakness nausea vomiting and diarrhea onset 5 days ago. No treatment prior to coming here. He states she's had 10 episodes of vomiting today and 10 episodes of diarrhea today. Generalized weakness is worse with standing, improved with lying down. He denies nausea present. He reports black stools this morning. No other associated symptoms. Denies any abdominal pain. Denies fever. Patient works as a Naval architect. No recent antibiotics. Past Medical History  Diagnosis Date  . Hepatitis C   . Hypertension   . Diabetes mellitus [facility])    Past Surgical History  Procedure Laterality Date  . Kidney stone surgery     Family History  Problem Relation Age of Onset  . Heart disease Mother   . Heart disease Father   . Healthy Sister   . Hypertension Brother   . Hypertension Brother   . Hypertension Brother   . Diabetes Brother    Social History  Substance Use Topics  . Smoking status: Never Smoker   . Smokeless tobacco: Never Used  . Alcohol Use: No     Comment: Patient states that he quit 3 1/2 years ago. Prior to thathe drank for a very long time.    Review of Systems  HENT: Negative.   Respiratory: Negative.   Cardiovascular: Negative.   Gastrointestinal: Positive for nausea, vomiting and diarrhea.  Musculoskeletal: Positive for back pain.       Chronic back pain  Skin: Negative.   Allergic/Immunologic: Positive for immunocompromised state.       Diabetic  Neurological: Positive for weakness.  Psychiatric/Behavioral: Negative.   All other systems reviewed and are negative.     Allergies  Review of  patient's allergies indicates no known allergies.  Home Medications   Prior to Admission medications   Medication Sig Start Date End Date Taking? Authorizing Provider  oxyCODONE (ROXICODONE) 15 MG immediate release tablet Take 30 mg by mouth every 4 (four) hours as needed for pain.   Yes Historical Provider, MD  cyclobenzaprine (FLEXERIL) 10 MG tablet Take 1 tablet (10 mg total) by mouth 3 (three) times daily as needed. 01/12/14   Tammy Triplett, PA-C  diphenoxylate-atropine (LOMOTIL) 2.5-0.025 MG per tablet Take 1 tablet by mouth 4 (four) times daily as needed for diarrhea or loose stools. 04/15/14   Donnetta Hutching, MD  lisinopril-hydrochlorothiazide (PRINZIDE,ZESTORETIC) 20-25 MG per tablet Take 1 tablet by mouth daily.    Historical Provider, MD  metFORMIN (GLUCOPHAGE) 500 MG tablet Take 500 mg by mouth 2 (two) times daily with a meal.    Historical Provider, MD  oxyCODONE-acetaminophen (PERCOCET) 10-325 MG per tablet Take 1 tablet by mouth every 4 (four) hours as needed for pain. 04/15/14   Donnetta Hutching, MD  oxyCODONE-acetaminophen (PERCOCET) 7.5-325 MG per tablet Take 1 tablet by mouth every 4 (four) hours as needed for pain. 01/12/14   Tammy Triplett, PA-C  predniSONE (DELTASONE) 10 MG tablet Take 6 tablets day one, 5 tablets day two, 4 tablets day three, 3 tablets day four, 2 tablets day five, then 1 tablet day six 01/12/14   Tammy Triplett, PA-C  promethazine (PHENERGAN) 25  MG tablet Take 1 tablet (25 mg total) by mouth every 6 (six) hours as needed. 04/15/14   Donnetta Hutching, MD   BP 105/56 mmHg  Pulse 120  Temp(Src) 98 F (36.7 C) (Oral)  Resp 18  Ht  (1.778 m)  Wt 238 lb (107.956 kg)  BMI 34.15 kg/m2  SpO2 100% Physical Exam  Constitutional: He appears well-developed and well-nourished. No distress.  HENT:  Head: Normocephalic and atraumatic.  Eyes: Conjunctivae are normal. Pupils are equal, round, and reactive to light.  Neck: Neck supple. No tracheal deviation present. No thyromegaly  present.  Cardiovascular: Regular rhythm.   No murmur heard. Mildly tachycardic  Pulmonary/Chest: Effort normal and breath sounds normal.  Abdominal: Soft. Bowel sounds are normal. He exhibits no distension. There is no tenderness.  Genitourinary: Guaiac positive stool.  Rectum normal tone and brown stool trace Hemoccult positive  Musculoskeletal: Normal range of motion. He exhibits no edema or tenderness.  Neurological: He is alert. Coordination normal.  Skin: Skin is warm and dry. No rash noted.  Psychiatric: He has a normal mood and affect.  Nursing note and vitals reviewed.   ED Course  Procedures (including critical care time) Labs Review Labs Reviewed - No data to display  Imaging Review No results found. I have personally reviewed and evaluated these images and lab results as part of my medical decision-making.   EKG Interpretation None     8:10 PM patient complains of severe low back pain radiating to right leg which is chronic. Intervenous morphine ordered.  8:50 PM back pain much improved after treatment with intravenous morphine. Results for orders placed or performed during the hospital encounter of 11/24/15  Comprehensive metabolic panel  Result Value Ref Range   Sodium 133 (L) 135 - 145 mmol/L   Potassium 3.9 3.5 - 5.1 mmol/L   Chloride 100 (L) 101 - 111 mmol/L   CO2 19 (L) 22 - 32 mmol/L   Glucose, Bld 131 (H) 65 - 99 mg/dL   BUN 84 (H) 6 - 20 mg/dL   Creatinine, Ser 1.61 (H) 0.61 - 1.24 mg/dL   Calcium 09.6 (H) 8.9 - 10.3 mg/dL   Total Protein 6.7 6.5 - 8.1 g/dL   Albumin 3.7 3.5 - 5.0 g/dL   AST 44 (H) 15 - 41 U/L   ALT 62 17 - 63 U/L   Alkaline Phosphatase 53 38 - 126 U/L   Total Bilirubin 0.8 0.3 - 1.2 mg/dL   GFR calc non Af Amer 46 (L) >60 mL/min   GFR calc Af Amer 53 (L) >60 mL/min   Anion gap 14 5 - 15  CBC with Differential/Platelet  Result Value Ref Range   WBC 11.2 (H) 4.0 - 10.5 K/uL   RBC 3.33 (L) 4.22 - 5.81 MIL/uL   Hemoglobin 10.7  (L) 13.0 - 17.0 g/dL   HCT 04.5 (L) 40.9 - 81.1 %   MCV 91.0 78.0 - 100.0 fL   MCH 32.1 26.0 - 34.0 pg   MCHC 35.3 30.0 - 36.0 g/dL   RDW 91.4 78.2 - 95.6 %   Platelets 278 150 - 400 K/uL   Neutrophils Relative % 59 %   Neutro Abs 6.6 1.7 - 7.7 K/uL   Lymphocytes Relative 31 %   Lymphs Abs 3.5 0.7 - 4.0 K/uL   Monocytes Relative 9 %   Monocytes Absolute 1.0 0.1 - 1.0 K/uL   Eosinophils Relative 1 %   Eosinophils Absolute 0.1 0.0 - 0.7 K/uL  Basophils Relative 0 %   Basophils Absolute 0.0 0.0 - 0.1 K/uL  Urinalysis, Routine w reflex microscopic (not at [facility])  Result Value Ref Range   Color, Urine YELLOW YELLOW   APPearance CLEAR CLEAR   Specific Gravity, Urine 1.010 1.005 - 1.030   pH 5.0 5.0 - 8.0   Glucose, UA NEGATIVE NEGATIVE mg/dL   Hgb urine dipstick NEGATIVE NEGATIVE   Bilirubin Urine NEGATIVE NEGATIVE   Ketones, ur NEGATIVE NEGATIVE mg/dL   Protein, ur NEGATIVE NEGATIVE mg/dL   Nitrite NEGATIVE NEGATIVE   Leukocytes, UA NEGATIVE NEGATIVE  POC occult blood, ED Provider will collect  Result Value Ref Range   Fecal Occult Bld POSITIVE (A) NEGATIVE   No results found.  MDM  Laboratory data consistent with severe dehydration and elevated BUN and creatinine consistent with acute kidney injury and possibly GI bleeding. He does have heme positive stools but gives history of dark tarry stools. Patient anemic compared to one year ago. Dr.Opyd consulted plan admit to telemetry, IV hydration, clear liquid diet. Suggest GI consult Diagnoses #1 severe dehydration #2 acute kidney injury #3 GI bleeding #4 chronic back pain #5 hyperglycemia #6 anemia #7 hypotension Final diagnoses:  None    CRITICAL CARE Performed by: Doug Sou Total critical care time: 30 minutes Critical care time was exclusive of separately billable procedures and treating other patients. Critical care was necessary to treat or prevent imminent or life-threatening deterioration. Critical care was  time spent personally by me on the following activities: development of treatment plan with patient and/or surrogate as well as nursing, discussions with consultants, evaluation of patient's response to treatment, examination of patient, obtaining history from patient or surrogate, ordering and performing treatments and interventions, ordering and review of laboratory studies, ordering and review of radiographic studies, pulse oximetry and re-evaluation of patient's condition.    Doug Sou, MD 11/24/15 9604  Doug Sou, MD 11/24/15 2227

## 2015-11-24 NOTE — ED Notes (Signed)
Reports N/V/D x three days with black tarry stools and emesis

## 2015-11-24 NOTE — H&P (Signed)
[facility] History and Physical  Gregory Duran DTO:[id number] DOB: 10/05/1960 DOA: 11/24/2015  Referring physician: ED physician PCP: Gregory Bellow, MD  Specialists:  Dr. Laural Golden (GI)   Chief Complaint: Nausea, vomiting, diarrhea, melena  HPI: Gregory Duran is a 55 y.o. male with PMH of hypertension, type 2 diabetes mellitus, and chronic low back pain who presents the ED with 3 days of nausea, nonbloody vomiting, diarrhea, and melena. Gregory Duran reports pain in Gregory Duran usual state of fairly good health until approximately 3 days ago when he developed generalized weakness with nausea, vomiting, and diarrhea. He also notes diffuse body aches but denies subjective fevers or chills. 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 He denies any abdominal pain per se, but there has been nausea, vomiting, and diarrhea and stools have been black and tarry. He denies any history of similar symptoms. He is not known to have peptic ulcers, denies alcohol use, and denies NSAID use. He has been unable to identify any alleviating or exacerbating factors and has not tried any interventions before coming to the ED. He endorses an acute exacerbation of Gregory Duran chronic low back pain that he attributes to vomiting. He denies chest pain, palpitations, headaches, lightheadedness, or confusion. He has been taking sips of Gatorade, but has otherwise been unable to tolerate PO intake for the past 3 days.  In ED, Gregory Duran was found to be afebrile, saturating well on room air, with sinus tachycardia in the 110s and marginal blood pressure. DRE was met with brown, weakly FOBT positive stool. CBC featured a normocytic anemia with hemoglobin of 10.7 and leukocytosis to 11,200. CMP features multiple derangements with a serum creatinine of 1.66, up from 0.8 on prior chem panel. There is also a hyponatremia and hypochloremia, and BUN is elevated to 84.  Gregory Duran was bolused with 2 L of normal saline in the emergency department, remained stable, and will be admitted for ongoing evaluation and management of suspected acute gastroenteritis with melena and dehydration.  Where does Gregory Duran live?   At home    Can Gregory Duran participate in ADLs?  Yes        Review of Systems:   General: no fevers, chills, sweats, or weight change. Fatigue, loss of appetite HEENT: no blurry vision, hearing changes or sore throat Pulm: no dyspnea, cough, or wheeze CV: no chest pain or palpitations Abd: no abdominal pain or constipation. Nausea, non-bloody vomiting, melanotic diarrhea GU: no dysuria, hematuria, increased urinary frequency, or urgency  Ext: no leg edema Neuro: no focal weakness, numbness, or tingling, no vision change or hearing loss Skin: no rash, no wounds MSK: No muscle spasm, no deformity, no red, hot, or swollen joint Heme: No easy bruising or bleeding Travel history: No recent long distant travel    Allergy: No Known Allergies  Past Medical History  Diagnosis Date  . Hepatitis C   . Hypertension   . Diabetes mellitus [facility])     Past Surgical History  Procedure Laterality Date  . Kidney stone surgery      Social History:  reports that he has never smoked. He has never used smokeless tobacco. He reports that he does not drink alcohol or use illicit drugs.  Family History:  Family History  Problem Relation Age of Onset  . Heart disease Mother   . Heart disease Father   . Healthy Sister   . Hypertension Brother   .  Hypertension Brother   . Hypertension Brother   . Diabetes Brother      Prior to Admission medications   Medication Sig Start Date End Date Taking? Authorizing Provider  aspirin EC 81 MG tablet Take 81 mg by mouth daily.   Yes Historical Provider, MD  glipiZIDE (GLUCOTROL) 5 MG tablet Take 10 mg by mouth 2 (two) times daily.    Yes Historical Provider, MD  lisinopril-hydrochlorothiazide (PRINZIDE,ZESTORETIC) 20-25 MG  per tablet Take 1 tablet by mouth daily.   Yes Historical Provider, MD  metFORMIN (GLUCOPHAGE) 500 MG tablet Take 500 mg by mouth 2 (two) times daily with a meal.   Yes Historical Provider, MD  Oxycodone HCl 10 MG TABS Take 10 mg by mouth 4 (four) times daily as needed. 10/21/15  Yes Historical Provider, MD  cyclobenzaprine (FLEXERIL) 10 MG tablet Take 1 tablet (10 mg total) by mouth 3 (three) times daily as needed. 01/12/14   Kem Parkinson, PA-C    Physical Exam: Filed Vitals:   11/24/15 2032 11/24/15 2100 11/24/15 2115 11/24/15 2130  BP: 109/50 103/50  95/59  Pulse: 107   106  Temp:      TempSrc:      Resp: '12 12 13 8  '$ Height:      Weight:      SpO2: 98%   98%   General: Not in acute distress HEENT:       Eyes: PERRL, EOMI, no scleral icterus or conjunctival pallor.       ENT: No discharge from the ears or nose, no pharyngeal ulcers, petechiae or exudate.        Neck: No JVD, no bruit, no appreciable mass Heme: No cervical adenopathy, no pallor Cardiac: S1/S2, RRR, No murmurs, No gallops or rubs. Pulm: Good air movement bilaterally. No rales, wheezing, rhonchi or rubs. Abd: Soft, nondistended, nontender, no rebound pain or gaurding, no mass or organomegaly, BS present. Ext: No LE edema bilaterally. 2+DP/PT pulse bilaterally. Musculoskeletal: No gross deformity, no red, hot, swollen joints  Skin: No rashes or wounds on exposed surfaces  Neuro: Alert, oriented X3, cranial nerves II-XII grossly intact. No focal findings Psych: Gregory Duran is not overtly psychotic, appropriate mood and affect.  Labs on Admission:  Basic Metabolic Panel:  Recent Labs Lab 11/24/15 1720  NA 133*  K 3.9  CL 100*  CO2 19*  GLUCOSE 131*  BUN 84*  CREATININE 1.66*  CALCIUM 10.8*   Liver Function Tests:  Recent Labs Lab 11/24/15 1720  AST 44*  ALT 62  ALKPHOS 53  BILITOT 0.8  PROT 6.7  ALBUMIN 3.7   No results for input(s): LIPASE, AMYLASE in the last 168 hours. No results for input(s):  AMMONIA in the last 168 hours. CBC:  Recent Labs Lab 11/24/15 1720  WBC 11.2*  NEUTROABS 6.6  HGB 10.7*  HCT 30.3*  MCV 91.0  PLT 278   Cardiac Enzymes: No results for input(s): CKTOTAL, CKMB, CKMBINDEX, TROPONINI in the last 168 hours.  BNP (last 3 results) No results for input(s): BNP in the last 8760 hours.  ProBNP (last 3 results) No results for input(s): PROBNP in the last 8760 hours.  CBG: No results for input(s): GLUCAP in the last 168 hours.  Radiological Exams on Admission: No results found.  EKG:  Not done in ED, will obtain as appropriate   Assessment/Plan  1. GI bleed with anemia  - Tarry stool x3 days; stool brown and weakly FOBT + on DRE  - Hgb 10.7; last  CBC on file is from 03/2014 and Hgb was 15.9 at that time  - Pt's severe malaise could be indicative of significant acute drop in Hgb, or alternatively, related to AGE and subsequent dehydration  - BUN of 84 supports an upper GI source, but could also be attributed to profound dehydration  - Fluid resuscitating  - Type and screen  - Check H/H over night for stability  - Hold pharmacologic VTE ppx, SCDs only  - Consider GI consultation, inpt vs outpt  2. Acute gastroenteritis  - 3 days of N/V/diarrhea with malaise and generalized aches suggestive of acute viral GE  - Fluid resuscitate  - GI panel by PCR  - Enteric precautions while workup underway  - Correct lytes, repeat chem panel in am    3. Acute kidney injury  - SCr 1.66, up from 0.8 at last measure (July 2015)  - Suspect this is an acute prerenal azotemia in setting of acute gastroenteritis and subsequent dehydration  - Fluid resuscitating  - Avoid nephrotoxins where possible   - Repeat chem panel in am   4. Hyponatremia  - Na 133 on arrival, Cl 100  - Suspected secondary to dehydration  - Anticipate resolution with IVF resuscitation  - Repeat chem panel in am   5. Leukocytosis - Could be secondary to AGE, or reactive from GI bleed   - No signs of serious bacterial infection at this time  - Monitoring off abx    6. Type II DM - Hold glipizide and metformin while inpt  - CBG q4h while NPO - Low-intensity SSI correctional while inpt  - Check A1c  - Carb-modified diet when appropriate    7. Hypertension  - BP running on low side currently  - Hold home lisinopril-HCTZ given AKI and dehydration  - Cautious resumption of home BP meds when renal fxn stabilizes and dehydration resolved     DVT ppx:  SCDs  Code Status: Full code Family Communication:  Yes, Gregory Duran's wife at bed side Disposition Plan: Admit to inpatient   Date of Service 11/24/2015    Vianne Bulls, MD [facility] Pager [phone]  If 7PM-7AM, please contact night-coverage [email or web address] Password [facility] 11/24/2015, 11:34 PM

## 2015-11-25 ENCOUNTER — Inpatient Hospital Stay (HOSPITAL_COMMUNITY): Payer: Self-pay | Admitting: Anesthesiology

## 2015-11-25 ENCOUNTER — Encounter (HOSPITAL_COMMUNITY): Payer: Self-pay | Admitting: *Deleted

## 2015-11-25 ENCOUNTER — Encounter (HOSPITAL_COMMUNITY)
Admission: EM | Disposition: A | Discharge status: Home / Self Care | Payer: Self-pay | Source: Home / Self Care | Attending: Internal Medicine

## 2015-11-25 DIAGNOSIS — K2970 Gastritis, unspecified, without bleeding: Secondary | ICD-10-CM

## 2015-11-25 DIAGNOSIS — K254 Chronic or unspecified gastric ulcer with hemorrhage: Principal | ICD-10-CM

## 2015-11-25 DIAGNOSIS — I10 Essential (primary) hypertension: Secondary | ICD-10-CM

## 2015-11-25 DIAGNOSIS — N179 Acute kidney failure, unspecified: Secondary | ICD-10-CM

## 2015-11-25 DIAGNOSIS — K922 Gastrointestinal hemorrhage, unspecified: Secondary | ICD-10-CM

## 2015-11-25 DIAGNOSIS — D62 Acute posthemorrhagic anemia: Secondary | ICD-10-CM

## 2015-11-25 DIAGNOSIS — D72829 Elevated white blood cell count, unspecified: Secondary | ICD-10-CM

## 2015-11-25 DIAGNOSIS — E860 Dehydration: Secondary | ICD-10-CM

## 2015-11-25 DIAGNOSIS — K208 Other esophagitis: Secondary | ICD-10-CM

## 2015-11-25 DIAGNOSIS — E119 Type 2 diabetes mellitus without complications: Secondary | ICD-10-CM

## 2015-11-25 DIAGNOSIS — K921 Melena: Secondary | ICD-10-CM

## 2015-11-25 DIAGNOSIS — K259 Gastric ulcer, unspecified as acute or chronic, without hemorrhage or perforation: Secondary | ICD-10-CM

## 2015-11-25 DIAGNOSIS — B182 Chronic viral hepatitis C: Secondary | ICD-10-CM

## 2015-11-25 HISTORY — PX: ESOPHAGOGASTRODUODENOSCOPY (EGD) WITH PROPOFOL: SHX5813

## 2015-11-25 LAB — GLUCOSE, CAPILLARY
Glucose-Capillary: 197 mg/dL — ABNORMAL HIGH (ref 65–99)
Glucose-Capillary: 225 mg/dL — ABNORMAL HIGH (ref 65–99)
Glucose-Capillary: 240 mg/dL — ABNORMAL HIGH (ref 65–99)
Glucose-Capillary: 198 mg/dL — ABNORMAL HIGH (ref 65–99)
Glucose-Capillary: 224 mg/dL — ABNORMAL HIGH (ref 65–99)
Glucose-Capillary: 200 mg/dL — ABNORMAL HIGH (ref 65–99)

## 2015-11-25 LAB — HEMOGLOBIN AND HEMATOCRIT, BLOOD
HCT: 27.3 % — ABNORMAL LOW (ref 39.0–52.0)
Hemoglobin: 9.7 g/dL — ABNORMAL LOW (ref 13.0–17.0)

## 2015-11-25 LAB — BASIC METABOLIC PANEL
Anion gap: 9 (ref 5–15)
BUN: 59 mg/dL — ABNORMAL HIGH (ref 6–20)
CO2: 22 mmol/L (ref 22–32)
Calcium: 9.4 mg/dL (ref 8.9–10.3)
Chloride: 104 mmol/L (ref 101–111)
Creatinine, Ser: 1.02 mg/dL (ref 0.61–1.24)
GFR calc Af Amer: >60 mL/min (ref >60)
GFR calc non Af Amer: >60 mL/min (ref >60)
Glucose, Bld: 173 mg/dL — ABNORMAL HIGH (ref 65–99)
Potassium: 3.8 mmol/L (ref 3.5–5.1)
Sodium: 135 mmol/L (ref 135–145)

## 2015-11-25 LAB — TYPE AND SCREEN
ABO/RH(D): O POS
Antibody Screen: NEGATIVE

## 2015-11-25 LAB — LACTIC ACID, PLASMA
Lactic Acid, Venous: 1.7 mmol/L (ref 0.5–2.0)
Lactic Acid, Venous: 1.7 mmol/L (ref 0.5–2.0)

## 2015-11-25 LAB — HEMOGLOBIN
Hemoglobin: 9.6 g/dL — ABNORMAL LOW (ref 13.0–17.0)
Hemoglobin: 9.8 g/dL — ABNORMAL LOW (ref 13.0–17.0)

## 2015-11-25 LAB — HEMATOCRIT
HCT: 27.1 % — ABNORMAL LOW (ref 39.0–52.0)
HCT: 27.6 % — ABNORMAL LOW (ref 39.0–52.0)

## 2015-11-25 SURGERY — ESOPHAGOGASTRODUODENOSCOPY (EGD) WITH PROPOFOL
Anesthesia: Monitor Anesthesia Care

## 2015-11-25 MED ORDER — LIDOCAINE VISCOUS 2 % MT SOLN
15.0000 mL | Freq: Once | OROMUCOSAL | Status: AC
  Administered 2015-11-25: 15 mL via OROMUCOSAL

## 2015-11-25 MED ORDER — SUCRALFATE 1 GM/10ML PO SUSP
1.0000 g | Freq: Three times a day (TID) | ORAL | Status: DC
  Administered 2015-11-25 – 2015-11-27 (×5): 1 g via ORAL
  Filled 2015-11-25 (×5): qty 10

## 2015-11-25 MED ORDER — LIDOCAINE HCL (PF) 1 % IJ SOLN
INTRAMUSCULAR | Status: AC
  Filled 2015-11-25: qty 5

## 2015-11-25 MED ORDER — FENTANYL CITRATE (PF) 100 MCG/2ML IJ SOLN
25.0000 ug | INTRAMUSCULAR | Status: AC
  Administered 2015-11-25 (×2): 25 ug via INTRAVENOUS

## 2015-11-25 MED ORDER — OXYCODONE HCL 5 MG PO TABS
10.0000 mg | ORAL_TABLET | Freq: Four times a day (QID) | ORAL | Status: DC | PRN
  Administered 2015-11-26 – 2015-11-27 (×3): 10 mg via ORAL
  Filled 2015-11-25 (×3): qty 2

## 2015-11-25 MED ORDER — MIDAZOLAM HCL 2 MG/2ML IJ SOLN
INTRAMUSCULAR | Status: AC
  Filled 2015-11-25: qty 2

## 2015-11-25 MED ORDER — ONDANSETRON HCL 4 MG/2ML IJ SOLN: 4.0000 mg | Freq: Once | INTRAMUSCULAR | Status: DC | PRN

## 2015-11-25 MED ORDER — FENTANYL CITRATE (PF) 100 MCG/2ML IJ SOLN
INTRAMUSCULAR | Status: AC
  Filled 2015-11-25: qty 2

## 2015-11-25 MED ORDER — PROPOFOL 10 MG/ML IV BOLUS
INTRAVENOUS | Status: AC
  Filled 2015-11-25: qty 40

## 2015-11-25 MED ORDER — MORPHINE SULFATE (PF) 2 MG/ML IV SOLN
2.0000 mg | INTRAVENOUS | Status: DC | PRN
  Administered 2015-11-25 – 2015-11-26 (×3): 2 mg via INTRAVENOUS
  Filled 2015-11-25 (×3): qty 1

## 2015-11-25 MED ORDER — LIDOCAINE HCL (CARDIAC) 10 MG/ML IV SOLN
INTRAVENOUS | Status: DC | PRN
  Administered 2015-11-25: 25 mg via INTRAVENOUS

## 2015-11-25 MED ORDER — INSULIN ASPART 100 UNIT/ML ~~LOC~~ SOLN
0.0000 [IU] | Freq: Three times a day (TID) | SUBCUTANEOUS | Status: DC
  Administered 2015-11-25: 3 [IU] via SUBCUTANEOUS
  Administered 2015-11-25: 2 [IU] via SUBCUTANEOUS
  Administered 2015-11-26: 5 [IU] via SUBCUTANEOUS
  Administered 2015-11-26: 3 [IU] via SUBCUTANEOUS
  Administered 2015-11-26 – 2015-11-27 (×2): 2 [IU] via SUBCUTANEOUS

## 2015-11-25 MED ORDER — LIDOCAINE VISCOUS 2 % MT SOLN
OROMUCOSAL | Status: AC
  Filled 2015-11-25: qty 15

## 2015-11-25 MED ORDER — PROPOFOL 500 MG/50ML IV EMUL
INTRAVENOUS | Status: DC | PRN
  Administered 2015-11-25: 50 ug/kg/min via INTRAVENOUS

## 2015-11-25 MED ORDER — SODIUM CHLORIDE 0.9 % IV SOLN: INTRAVENOUS | Status: DC

## 2015-11-25 MED ORDER — PHENYLEPHRINE HCL 10 MG/ML IJ SOLN
INTRAMUSCULAR | Status: DC | PRN
  Administered 2015-11-25: 80 ug via INTRAVENOUS

## 2015-11-25 MED ORDER — FENTANYL CITRATE (PF) 100 MCG/2ML IJ SOLN
25.0000 ug | INTRAMUSCULAR | Status: DC | PRN
Start: 2015-11-25 — End: 2015-11-25
  Administered 2015-11-25: 50 ug via INTRAVENOUS
  Administered 2015-11-25: 25 ug via INTRAVENOUS
  Filled 2015-11-25 (×2): qty 2

## 2015-11-25 MED ORDER — PHENYLEPHRINE 40 MCG/ML (10ML) SYRINGE FOR IV PUSH (FOR BLOOD PRESSURE SUPPORT)
PREFILLED_SYRINGE | INTRAVENOUS | Status: AC
  Filled 2015-11-25: qty 10

## 2015-11-25 MED ORDER — LACTATED RINGERS IV SOLN
INTRAVENOUS | Status: DC
  Administered 2015-11-25 (×2): via INTRAVENOUS

## 2015-11-25 MED ORDER — MIDAZOLAM HCL 2 MG/2ML IJ SOLN
1.0000 mg | INTRAMUSCULAR | Status: DC | PRN
  Administered 2015-11-25 (×3): 2 mg via INTRAVENOUS

## 2015-11-25 MED ORDER — SODIUM CHLORIDE 0.9 % IV SOLN
INTRAVENOUS | Status: DC
  Administered 2015-11-25 – 2015-11-26 (×2): via INTRAVENOUS

## 2015-11-25 NOTE — Progress Notes (Signed)
[facility] PROGRESS NOTE  Gregory Duran WGN:[id number] DOB: 20-Oct-1960 DOA: 11/24/2015 PCP: Milana Obey, MD  Assessment/Plan: 1. GI bleed with anemia  - Tarry stool x3 days; stool brown and FOBT + on DRE  - Hgb 10.7; last CBC on file is from 03/2014 and Hgb was 15.9 at that time  -after IVF's resuscitation down to 9.7 -no further melena or overt bleeding -EGD done and demonstrating erosive esophagitis and antral ulcer -H. Pylori serology ordered  -continue IV PPI -slowly advance diet -follow Hgb trend -will add carafate  -avoiding heparin products -appreciate GI assessment and rec's -will follow clinical response -anticipate discharge in 24-48 hours -no transfusion needed currently   2. Acute gastroenteritis  - 3 days of N/V/diarrhea with malaise and generalized aches suggestive of acute viral GE  - much better after fluid resuscitation  - GI panel by PCR pending - Enteric precautions while workup underway  - replete electrolytes as needed   3. Acute kidney injury  - SCr 1.66, up from 0.8 at last measure (July 2015)  - Suspect this is an acute prerenal azotemia in setting of acute gastroenteritis and subsequent dehydration. -improving with IVF's -will continue holding nephrotoxic agents -avoid hypotension   4. Hyponatremia  - due to dehydration -will continue IVF's -follow electrolytes trend  5. Leukocytosis - Could be secondary to stress demargination and dehydration  - No signs of bacterial infection at this time  - Monitoring off abx  - follow WBC's trend  6. Type II DM - Hold glipizide and metformin while inpt  - Low-intensity SSI correctional while inpt  - A1c pending - Carb-modified diet when appropriate   7. Hypertension  - BP running on low side currently  - will continue holding lisinopril-HCTZ  - will Cautiously resumed home BP meds when renal fxn stabilizes and dehydration resolved   Code Status: Full Family  Communication: no family at bedside  Disposition Plan: will advance diet to full, continue IV PPI until tomorrow as recommended by GI; follow Hgb trend   Consultants:  GI (Dr. Karilyn Cota)  Procedures:  EGD: 2/24 -erosive reflux esophagitis  -Antral gastritis with 6 mm prepyloric ulcer with clean base.  Antibiotics:  None   HPI/Subjective: Feeling better overall. reports that back pain is significantly improved; has not experienced any further vomiting, diarrhea, melena or abd pain  Objective: Filed Vitals:   11/25/15 1415 11/25/15 1442  BP: 100/77 96/69  Pulse: 125 123  Temp:  97.8 F (36.6 C)  Resp: 19 24    Intake/Output Summary (Last 24 hours) at 11/25/15 1818 Last data filed at 11/25/15 1354  Gross per 24 hour  Intake    600 ml  Output      0 ml  Net    600 ml   Filed Weights   11/24/15 1656 11/25/15 0508 11/25/15 1229  Weight: 107.956 kg (238 lb) 108.591 kg (239 lb 6.4 oz) 108.41 kg (239 lb)    Exam:   General:  Afebrile, no CP, no SOB. Feeling better overall. Denies abd pain, nausea and vomiting. No further diarrhea and no overt bleeding   Cardiovascular: S1 and S2, no rubs, no gallop  Respiratory: good air movement, no wheezing  Abdomen: soft, NT, ND, positive BS  Musculoskeletal: no edema, no cyanosis   Data Reviewed: Basic Metabolic Panel:  Recent Labs Lab 11/24/15 1720 11/25/15 0222  NA 133* 135  K 3.9 3.8  CL 100* 104  CO2 19* 22  GLUCOSE 131* 173*  BUN  84* 59*  CREATININE 1.66* 1.02  CALCIUM 10.8* 9.4   Liver Function Tests:  Recent Labs Lab 11/24/15 1720  AST 44*  ALT 62  ALKPHOS 53  BILITOT 0.8  PROT 6.7  ALBUMIN 3.7   CBC:  Recent Labs Lab 11/24/15 1720 11/24/15 2355 11/25/15 0642 11/25/15 1443  WBC 11.2*  --   --   --   NEUTROABS 6.6  --   --   --   HGB 10.7* 9.8* 9.6* 9.7*  HCT 30.3* 27.6* 27.1* 27.3*  MCV 91.0  --   --   --   PLT 278  --   --   --    CBG:  Recent Labs Lab 11/25/15 0821 11/25/15 1117  11/25/15 1224 11/25/15 1357 11/25/15 1733  GLUCAP 197* 225* 240* 198* 224*    Studies: No results found.  Scheduled Meds: . insulin aspart  0-9 Units Subcutaneous TID WC  . pantoprazole (PROTONIX) IV  40 mg Intravenous Q12H  . sodium chloride flush  3 mL Intravenous Q12H  . sucralfate  1 g Oral TID   Continuous Infusions: . sodium chloride 125 mL/hr at 11/25/15 0920  . sodium chloride      Active Problems:   Hypertension   Diabetes mellitus (HCC)   Chronic low back pain   Severe dehydration   AKI (acute kidney injury) (HCC)   Melena   Hyponatremia   Leukocytosis   GI bleed    Time spent: 30 minutes    Vassie Loll  [facility] Pager [phone]. If 7PM-7AM, please contact night-coverage at [email or web address], password [facility] 11/25/2015, 6:18 PM  LOS: 1 day

## 2015-11-25 NOTE — Op Note (Signed)
EGD PROCEDURE REPORT  PATIENT:  Gregory Duran  MR#:  [id number] Birthdate:  1961-03-18, 55 y.o., male Endoscopist:  Dr. Malissa Hippo, MD Referred By:  Dr. Vassie Loll, MD Procedure Date: 11/25/2015  Procedure:   EGD  Indications:  Patient is 55 year old Caucasian male who presents with history of melena nausea vomiting and he has anemia. Patient is on low-dose aspirin and also takes BC powder on frequent basis but not every day.            Informed Consent:  The risks, benefits, alternatives & imponderables which include, but are not limited to, bleeding, infection, perforation, drug reaction and potential missed lesion have been reviewed.  The potential for biopsy, lesion removal, esophageal dilation, etc. have also been discussed.  Questions have been answered.  All parties agreeable.  Please see history & physical in medical record for more information.  Medications:  Lidocaine gel for oropharyngeal topical anesthesia. Monitored anesthesia care. Please see anesthesia records for details.  Description of procedure:  The endoscope was introduced through the mouth and advanced to the second portion of the duodenum without difficulty or limitations. The mucosal surfaces were surveyed very carefully during advancement of the scope and upon withdrawal.  Findings:  Esophagus: Two linear erosions noted in the distal esophagus. GEJ:  40 cm Stomach:  Stomach was empty and distended very well with insufflation. Folds in the proximal stomach were normal. Examination mucosa gastric body was normal. Normal antrum mucosa except and prepyloric region where there was gastritis with 3 mm ulcer with clean base. Pyloric channel was patent. Angularis fundus and cardia were unremarkable. Duodenum:  Normal bulbar and post bulbar mucosa.  Therapeutic/Diagnostic Maneuvers Performed:  None  Complications:  None  EBL: None  Impression: Erosive reflux esophagitis. Antral gastritis with 6 mm prepyloric  ulcer with clean base.  Recommendations:  Full liquid diet. Will check H. pylori serology instead of stool antigen. Continue IV pantoprazole until tomorrow. H&H this afternoon. Patient will need follow-up EGD in 10-12 weeks to document complete healing of this ulcer.  REHMAN,NAJEEB U  11/25/2015  1:54 PM  CC: Dr. Milana Obey, MD & Dr. Bonnetta Barry ref. provider found

## 2015-11-25 NOTE — Consult Note (Signed)
Reason for Consult: melena Referring Physician: Hospitalist  Gregory Duran is an 55 y.o. male.  HPI: Admitted thru the ED yesterday thru the ED with c/o black stools. He had no energy. 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 No vomiting since Tuesday. Tuesday and was a light brown in color.   He had the black stool Tuesday morning. He denies prior hx of having a black stool. He developed diarrhea later Tuesday. He says if he drinks anything now, he will have a loose stool. He takes a Sport and exercise psychologist ASA daily.  Hx of Hepatitis C, hypertension, and DM. Hypertension since age 46.  DM since x 7 yrs. He denies any acid reflux. He says he did have acid reflux when he drank etoh heavy. He quit drinking in 2010.  There is no abdominal pain.  He has had a 1 gm drop since 11/24/2015. Stool was brown in the ED yesterday and guaiac positive. He denies taking Pepto Bismol. No recent antibiotics. He feels 80% better today.  He has never undergone a colonoscopy in the past. Prior hx of IV drug abuse.  Takes Oxycodone 48m four times a day for back pain.    CBC Latest Ref Rng 11/25/2015 11/24/2015 11/24/2015  WBC 4.0 - 10.5 K/uL - - 11.2(H)  Hemoglobin 13.0 - 17.0 g/dL 9.6(L) 9.8(L) 10.7(L)  Hematocrit 39.0 - 52.0 % 27.1(L) 27.6(L) 30.3(L)  Platelets 150 - 400 K/uL - - 278       Past Medical History  Diagnosis Date  . Hepatitis C   . Hypertension   . Diabetes mellitus ([facility]     Past Surgical History  Procedure Laterality Date  . Kidney stone surgery      Family History  Problem Relation Age of Onset  . Heart disease Mother   . Heart disease Father   . Healthy Sister   . Hypertension Brother   . Hypertension Brother   . Hypertension Brother   . Diabetes Brother     Social History:  reports that he has never smoked. He has never used smokeless tobacco. He reports that he does not drink alcohol or use illicit drugs.  Allergies: No Known Allergies  Medications: I have reviewed the  patient's current medications.  Results for orders placed or performed during the hospital encounter of 11/24/15 (from the past 48 hour(s))  Comprehensive metabolic panel     Status: Abnormal   Collection Time: 11/24/15  5:20 PM  Result Value Ref Range   Sodium 133 (L) 135 - 145 mmol/L   Potassium 3.9 3.5 - 5.1 mmol/L   Chloride 100 (L) 101 - 111 mmol/L   CO2 19 (L) 22 - 32 mmol/L   Glucose, Bld 131 (H) 65 - 99 mg/dL   BUN 84 (H) 6 - 20 mg/dL   Creatinine, Ser 1.66 (H) 0.61 - 1.24 mg/dL   Calcium 10.8 (H) 8.9 - 10.3 mg/dL   Total Protein 6.7 6.5 - 8.1 g/dL   Albumin 3.7 3.5 - 5.0 g/dL   AST 44 (H) 15 - 41 U/L   ALT 62 17 - 63 U/L   Alkaline Phosphatase 53 38 - 126 U/L   Total Bilirubin 0.8 0.3 - 1.2 mg/dL   GFR calc non Af Amer 46 (L) >60 mL/min   GFR calc Af Amer 53 (L) >60 mL/min    Comment: (NOTE) The eGFR has been calculated using the CKD EPI equation. This calculation has not been validated in all clinical situations. eGFR's  persistently <60 mL/min signify possible Chronic Kidney Disease.    Anion gap 14 5 - 15  CBC with Differential/Platelet     Status: Abnormal   Collection Time: 11/24/15  5:20 PM  Result Value Ref Range   WBC 11.2 (H) 4.0 - 10.5 K/uL   RBC 3.33 (L) 4.22 - 5.81 MIL/uL   Hemoglobin 10.7 (L) 13.0 - 17.0 g/dL   HCT 30.3 (L) 39.0 - 52.0 %   MCV 91.0 78.0 - 100.0 fL   MCH 32.1 26.0 - 34.0 pg   MCHC 35.3 30.0 - 36.0 g/dL   RDW 13.3 11.5 - 15.5 %   Platelets 278 150 - 400 K/uL   Neutrophils Relative % 59 %   Neutro Abs 6.6 1.7 - 7.7 K/uL   Lymphocytes Relative 31 %   Lymphs Abs 3.5 0.7 - 4.0 K/uL   Monocytes Relative 9 %   Monocytes Absolute 1.0 0.1 - 1.0 K/uL   Eosinophils Relative 1 %   Eosinophils Absolute 0.1 0.0 - 0.7 K/uL   Basophils Relative 0 %   Basophils Absolute 0.0 0.0 - 0.1 K/uL  POC occult blood, ED Provider will collect     Status: Abnormal   Collection Time: 11/24/15  5:39 PM  Result Value Ref Range   Fecal Occult Bld POSITIVE (A)  NEGATIVE  Urinalysis, Routine w reflex microscopic (not at [facility])     Status: None   Collection Time: 11/24/15  8:15 PM  Result Value Ref Range   Color, Urine YELLOW YELLOW   APPearance CLEAR CLEAR   Specific Gravity, Urine 1.010 1.005 - 1.030   pH 5.0 5.0 - 8.0   Glucose, UA NEGATIVE NEGATIVE mg/dL   Hgb urine dipstick NEGATIVE NEGATIVE   Bilirubin Urine NEGATIVE NEGATIVE   Ketones, ur NEGATIVE NEGATIVE mg/dL   Protein, ur NEGATIVE NEGATIVE mg/dL   Nitrite NEGATIVE NEGATIVE   Leukocytes, UA NEGATIVE NEGATIVE    Comment: MICROSCOPIC NOT DONE ON URINES WITH NEGATIVE PROTEIN, BLOOD, LEUKOCYTES, NITRITE, OR GLUCOSE <1000 mg/dL.  Hemoglobin     Status: Abnormal   Collection Time: 11/24/15 11:55 PM  Result Value Ref Range   Hemoglobin 9.8 (L) 13.0 - 17.0 g/dL  Hematocrit     Status: Abnormal   Collection Time: 11/24/15 11:55 PM  Result Value Ref Range   HCT 27.6 (L) 39.0 - 52.0 %  Type and screen [facility]     Status: None   Collection Time: 11/24/15 11:55 PM  Result Value Ref Range   ABO/RH(D) O POS    Antibody Screen NEG    Sample Expiration 11/27/2015   Lactic acid, plasma     Status: None   Collection Time: 11/24/15 11:55 PM  Result Value Ref Range   Lactic Acid, Venous 1.7 0.5 - 2.0 mmol/L  Basic metabolic panel     Status: Abnormal   Collection Time: 11/25/15  2:22 AM  Result Value Ref Range   Sodium 135 135 - 145 mmol/L   Potassium 3.8 3.5 - 5.1 mmol/L   Chloride 104 101 - 111 mmol/L   CO2 22 22 - 32 mmol/L   Glucose, Bld 173 (H) 65 - 99 mg/dL   BUN 59 (H) 6 - 20 mg/dL   Creatinine, Ser 1.02 0.61 - 1.24 mg/dL   Calcium 9.4 8.9 - 10.3 mg/dL   GFR calc non Af Amer >60 >60 mL/min   GFR calc Af Amer >60 >60 mL/min    Comment: (NOTE) The eGFR has been calculated  using the CKD EPI equation. This calculation has not been validated in all clinical situations. eGFR's persistently <60 mL/min signify possible Chronic Kidney Disease.    Anion gap 9 5 - 15  Lactic  acid, plasma     Status: None   Collection Time: 11/25/15  2:22 AM  Result Value Ref Range   Lactic Acid, Venous 1.7 0.5 - 2.0 mmol/L  Hemoglobin     Status: Abnormal   Collection Time: 11/25/15  6:42 AM  Result Value Ref Range   Hemoglobin 9.6 (L) 13.0 - 17.0 g/dL  Hematocrit     Status: Abnormal   Collection Time: 11/25/15  6:42 AM  Result Value Ref Range   HCT 27.1 (L) 39.0 - 52.0 %  Glucose, capillary     Status: Abnormal   Collection Time: 11/25/15  8:21 AM  Result Value Ref Range   Glucose-Capillary 197 (H) 65 - 99 mg/dL   Comment 1 Notify RN     No results found.  ROS Blood pressure 94/58, pulse 121, temperature 97.8 F (36.6 C), temperature source Oral, resp. rate 20, height _0  (1.778 m), weight 239 lb 6.4 oz (108.591 kg), SpO2 96 %. Physical Exam  Alert and oriented. Skin warm and dry. Oral mucosa is moist.   . Sclera anicteric, conjunctivae is pink. Thyroid not enlarged. No cervical lymphadenopathy. Lungs clear. Heart regular rate and rhythm.  Abdomen is soft. Bowel sounds are positive. No hepatomegaly. No abdominal masses felt. No tenderness.  No edema to lower extremities.     Assessment/Plan: Melena hx. Stool was brown in the ED and guaiac positive.  ? PUD. No further melena. I will discuss with Dr. Laural Golden.     SETZER,TERRI W 11/25/2015, 10:42 AM     GI attending note: Patient interviewed and examined and lab data reviewed. Upper GI bleed possibly secondary to peptic ulcer disease given that he is on low-dose aspirin and also takes BC powder almost daily. If primary illnesses while gastroenteritis he could have suffered upper GI bleed due to Mallory-Weiss tear given history of vomiting. Abdominal examination is within normal limits. He has history of chronic hepatitis C and was evaluated few years ago. Evaluation and treatment would be undertaken on an outpatient basis. Proceed with diagnostic esophagogastroduodenoscopy. Procedure risks reviewed with the  patient and he is agreeable.

## 2015-11-25 NOTE — Progress Notes (Signed)
Hemoglobin 9.7 and hematocrit 27.3.

## 2015-11-25 NOTE — Anesthesia Preprocedure Evaluation (Signed)
Anesthesia Evaluation  Patient identified by MRN, date of birth, ID band Patient awake    Reviewed: Allergy & Precautions, NPO status , Patient's Chart, lab work & pertinent test results  Airway Mallampati: I  TM Distance: >3 FB     Dental  (+) Teeth Intact, Dental Advisory Given   Pulmonary neg pulmonary ROS,    breath sounds clear to auscultation       Cardiovascular hypertension, Pt. on medications  Rhythm:Regular Rate:Normal     Neuro/Psych    GI/Hepatic (+) Hepatitis -, C  Endo/Other  diabetes  Renal/GU Renal InsufficiencyRenal disease     Musculoskeletal   Abdominal   Peds  Hematology  (+) Blood dyscrasia, anemia ,   Anesthesia Other Findings   Reproductive/Obstetrics                             Anesthesia Physical Anesthesia Plan  ASA: III  Anesthesia Plan: MAC   Post-op Pain Management:    Induction: Intravenous  Airway Management Planned: Simple Face Mask  Additional Equipment:   Intra-op Plan:   Post-operative Plan:   Informed Consent: I have reviewed the patients History and Physical, chart, labs and discussed the procedure including the risks, benefits and alternatives for the proposed anesthesia with the patient or authorized representative who has indicated his/her understanding and acceptance.     Plan Discussed with:   Anesthesia Plan Comments:         Anesthesia Quick Evaluation

## 2015-11-25 NOTE — Anesthesia Postprocedure Evaluation (Signed)
Anesthesia Post Note  Patient: Gregory Duran  Procedure(s) Performed: Procedure(s) (LRB): ESOPHAGOGASTRODUODENOSCOPY (EGD) WITH PROPOFOL (N/A)  Patient location during evaluation: PACU Anesthesia Type: MAC Level of consciousness: awake and alert and oriented Pain management: pain level controlled Vital Signs Assessment: post-procedure vital signs reviewed and stable Respiratory status: spontaneous breathing Cardiovascular status: blood pressure returned to baseline and tachycardic (VSS as pre procedure; DR. Rehmnan and Dr. Marcos Eke aware) Postop Assessment: no signs of nausea or vomiting Anesthetic complications: no    Last Vitals:  Filed Vitals:   11/25/15 1300 11/25/15 1352  BP: 100/69 88/39  Pulse:  124  Temp:    Resp: 13 20    Last Pain:  Filed Vitals:   11/25/15 1355  PainSc: 8                  ADAMS, AMY A

## 2015-11-25 NOTE — Transfer of Care (Signed)
Immediate Anesthesia Transfer of Care Note  Patient: Gregory Duran  Procedure(s) Performed: Procedure(s): ESOPHAGOGASTRODUODENOSCOPY (EGD) WITH PROPOFOL (N/A)  Patient Location: PACU  Anesthesia Type:MAC  Level of Consciousness: awake, alert , oriented and patient cooperative  Airway & Oxygen Therapy: Patient Spontanous Breathing  Post-op Assessment: Report given to RN and Post -op Vital signs reviewed and stable  Post vital signs: Reviewed and stable  Last Vitals:  Filed Vitals:   11/25/15 1255 11/25/15 1300  BP: 93/62 100/69  Pulse:    Temp:    Resp: 15 13    Complications: No apparent anesthesia complications

## 2015-11-25 NOTE — Anesthesia Procedure Notes (Signed)
Procedure Name: MAC Date/Time: 11/25/2015 1:32 PM Performed by: Pernell Dupre, AMY A Pre-anesthesia Checklist: Patient identified, Emergency Drugs available, Suction available, Patient being monitored and Timeout performed Patient Re-evaluated:Patient Re-evaluated prior to inductionOxygen Delivery Method: Simple face mask

## 2015-11-26 DIAGNOSIS — K259 Gastric ulcer, unspecified as acute or chronic, without hemorrhage or perforation: Secondary | ICD-10-CM

## 2015-11-26 DIAGNOSIS — G8929 Other chronic pain: Secondary | ICD-10-CM

## 2015-11-26 DIAGNOSIS — E871 Hypo-osmolality and hyponatremia: Secondary | ICD-10-CM

## 2015-11-26 DIAGNOSIS — M545 Low back pain: Secondary | ICD-10-CM

## 2015-11-26 DIAGNOSIS — K210 Gastro-esophageal reflux disease with esophagitis: Secondary | ICD-10-CM

## 2015-11-26 DIAGNOSIS — E118 Type 2 diabetes mellitus with unspecified complications: Secondary | ICD-10-CM

## 2015-11-26 LAB — BASIC METABOLIC PANEL
Anion gap: 5 (ref 5–15)
BUN: 21 mg/dL — ABNORMAL HIGH (ref 6–20)
CO2: 28 mmol/L (ref 22–32)
Calcium: 8.3 mg/dL — ABNORMAL LOW (ref 8.9–10.3)
Chloride: 104 mmol/L (ref 101–111)
Creatinine, Ser: 0.84 mg/dL (ref 0.61–1.24)
GFR calc Af Amer: >60 mL/min (ref >60)
GFR calc non Af Amer: >60 mL/min (ref >60)
Glucose, Bld: 214 mg/dL — ABNORMAL HIGH (ref 65–99)
Potassium: 4.3 mmol/L (ref 3.5–5.1)
Sodium: 137 mmol/L (ref 135–145)

## 2015-11-26 LAB — CBC
HCT: 26 % — ABNORMAL LOW (ref 39.0–52.0)
Hemoglobin: 9.1 g/dL — ABNORMAL LOW (ref 13.0–17.0)
MCH: 31.9 pg (ref 26.0–34.0)
MCHC: 35 g/dL (ref 30.0–36.0)
MCV: 91.2 fL (ref 78.0–100.0)
Platelets: 167 10*3/uL (ref 150–400)
RBC: 2.85 MIL/uL — ABNORMAL LOW (ref 4.22–5.81)
RDW: 13 % (ref 11.5–15.5)
WBC: 4.8 10*3/uL (ref 4.0–10.5)

## 2015-11-26 LAB — GLUCOSE, CAPILLARY
Glucose-Capillary: 189 mg/dL — ABNORMAL HIGH (ref 65–99)
Glucose-Capillary: 229 mg/dL — ABNORMAL HIGH (ref 65–99)
Glucose-Capillary: 259 mg/dL — ABNORMAL HIGH (ref 65–99)
Glucose-Capillary: 258 mg/dL — ABNORMAL HIGH (ref 65–99)

## 2015-11-26 LAB — HEMOGLOBIN A1C
Hgb A1c MFr Bld: 7.6 % — ABNORMAL HIGH (ref 4.8–5.6)
Mean Plasma Glucose: 171 mg/dL

## 2015-11-26 MED ORDER — MORPHINE SULFATE (PF) 4 MG/ML IV SOLN
4.0000 mg | INTRAVENOUS | Status: DC | PRN
  Administered 2015-11-26 – 2015-11-27 (×4): 4 mg via INTRAVENOUS
  Filled 2015-11-26 (×4): qty 1

## 2015-11-26 MED ORDER — PANTOPRAZOLE SODIUM 40 MG PO TBEC
40.0000 mg | DELAYED_RELEASE_TABLET | Freq: Two times a day (BID) | ORAL | Status: DC
  Administered 2015-11-26 – 2015-11-27 (×2): 40 mg via ORAL
  Filled 2015-11-26 (×2): qty 1

## 2015-11-26 NOTE — Progress Notes (Signed)
Tolerating diet. No nausea or vomiting. Patient denies melena or hematochezia. Case discussed with Dr. Karilyn Cota last evening along with EGD findings.  Vital signs in last 24 hours: Temp:  [97.5 F (36.4 C)-97.8 F (36.6 C)] 97.6 F (36.4 C) (02/24 2020) Pulse Rate:  [123-129] 125 (02/24 2020) Resp:  [13-24] 22 (02/24 2020) BP: (88-123)/(39-77) 123/69 mmHg (02/24 2020) SpO2:  [91 %-98 %] 97 % (02/24 2020) Weight:  [239 lb (108.41 kg)] 239 lb (108.41 kg) (02/24 1229) Last BM Date: 11/25/15 General:   Alert,   pleasant and cooperative in NAD Abdomen:  Soft, nontender and nondistended.  Normal bowel sounds, without guarding, and without rebound.  No mass or organomegaly. Extremities:  Without clubbing or edema.    Intake/Output from previous day: 02/24 0701 - 02/25 0700 In: 600 [I.V.:600] Out: -  Intake/Output this shift:    Lab Results:  Recent Labs  11/24/15 1720  11/25/15 0642 11/25/15 1443 11/26/15 0640  WBC 11.2*  --   --   --  4.8  HGB 10.7*  < > 9.6* 9.7* 9.1*  HCT 30.3*  < > 27.1* 27.3* 26.0*  PLT 278  --   --   --  167  < > = values in this interval not displayed. BMET  Recent Labs  11/24/15 1720 11/25/15 0222 11/26/15 0640  NA 133* 135 137  K 3.9 3.8 4.3  CL 100* 104 104  CO2 19* 22 28  GLUCOSE 131* 173* 214*  BUN 84* 59* 21*  CREATININE 1.66* 1.02 0.84  CALCIUM 10.8* 9.4 8.3*   LFT  Recent Labs  11/24/15 1720  PROT 6.7  ALBUMIN 3.7  AST 44*  ALT 62  ALKPHOS 53  BILITOT 0.8   PT/INR No results for input(s): LABPROT, INR in the last 72 hours. Hepatitis Panel No results for input(s): HEPBSAG, HCVAB, HEPAIGM, HEPBIGM in the last 72 hours. C-Diff No results for input(s): CDIFFTOX in the last 72 hours.   Impression:  Upper GI bleed secondary to gastric ulcer/reflux esophagitis appears to have resolved.  Recommendations:  Continue twice a day PPI therapy. Avoid all nonsteroidal agents. Follow up on H. pylori serologies. From a GI standpoint,  I feel he can be discharged within the next 24 hours. Follow-up with Dr. Karilyn Cota

## 2015-11-26 NOTE — Progress Notes (Signed)
[facility] PROGRESS NOTE  MATHEWS STUHR RUE:[id number] DOB: 26-Aug-1961 DOA: 11/24/2015 PCP: Milana Obey, MD  Assessment/Plan: 1. GI bleed with anemia  - Tarry stool x3 days; stool brown and FOBT + on DRE  - Hgb 10.7; last CBC on file is from 03/2014 and Hgb was 15.9 at that time  -after IVF's resuscitation down to 9.1 -no further melena or overt bleeding -EGD done and demonstrating erosive esophagitis and antral ulcer -H. Pylori serology ordered and pending  -Will switch Protonix to oral regimen twice a day -slowly continue advancing diet -follow Hgb trend -will continue carafate  -avoiding heparin products and NSAIDs  -appreciate GI assessment and rec's -will follow clinical response -anticipate discharge in next 24 hours -no transfusion needed  -At discharge will provide Iron supplementation  2. Acute gastroenteritis  - 3 days of N/V/diarrhea with malaise and generalized aches suggestive of acute viral GE  - much better after fluid resuscitation; denies any further episode of nausea, vomiting and diarrhea.  - GI panel by PCR pending - Enteric precautions while workup underway  - replete electrolytes as needed   3. Acute kidney injury  - SCr 1.66, up from 0.8 at last measure (July 2015)  - Suspect this is an acute prerenal azotemia in setting of acute gastroenteritis and subsequent dehydration. -Back to normal range with fluid resuscitation  -will continue holding nephrotoxic agents -avoid hypotension   4. Hyponatremia  - due to dehydration -Improved/resolved with IV fluids -follow electrolytes trend  5. Leukocytosis - Could be secondary to stress demargination and dehydration  - No signs of bacterial infection at this time  - Wbc's back to normal now.  6. Type II DM - Hold glipizide and metformin while inpt  - Low-intensity SSI correctional while inpt  - A1c 7.6 - Carb-modified diet resumed today  7. Hypertension  - BP continue to  be soft but stable - will continue holding lisinopril-HCTZ  - will Cautiously resumed home BP meds when renal fxn stabilizes and dehydration resolved   Code Status: Full Family Communication: no family at bedside  Disposition Plan: will advance diet to modify carbohydrates/heart healthy; switch PPI to oral twice a day, follow H. pylori serology. Most likely home in a.m. (2/26)  Consultants:  GI (Dr. Karilyn Cota)  Procedures:  EGD: 2/24 -erosive reflux esophagitis  -Antral gastritis with 6 mm prepyloric ulcer with clean base.  Antibiotics:  None   HPI/Subjective: Feeling better overall. Continue complaining of back pain. Denies any further episode of nausea, vomiting, diarrhea or any signs of overt bleeding.  Objective: Filed Vitals:   11/25/15 1442 11/25/15 2020  BP: 96/69 123/69  Pulse: 123 125  Temp: 97.8 F (36.6 C) 97.6 F (36.4 C)  Resp: 24 22   No intake or output data in the 24 hours ending 11/26/15 1555 Filed Weights   11/24/15 1656 11/25/15 0508 11/25/15 1229  Weight: 107.956 kg (238 lb) 108.591 kg (239 lb 6.4 oz) 108.41 kg (239 lb)    Exam:   General:  Afebrile, no CP, no SOB. Feeling better overall. Denies abd pain, nausea and vomiting. No further diarrhea and no overt bleeding appreciated. Patient complaining of back pain.  Cardiovascular: S1 and S2, no rubs, no gallop  Respiratory: good air movement, no wheezing  Abdomen: soft, NT, ND, positive BS  Musculoskeletal: no edema, no cyanosis   Data Reviewed: Basic Metabolic Panel:  Recent Labs Lab 11/24/15 1720 11/25/15 0222 11/26/15 0640  NA 133* 135 137  K 3.9 3.8  4.3  CL 100* 104 104  CO2 19* 22 28  GLUCOSE 131* 173* 214*  BUN 84* 59* 21*  CREATININE 1.66* 1.02 0.84  CALCIUM 10.8* 9.4 8.3*   Liver Function Tests:  Recent Labs Lab 11/24/15 1720  AST 44*  ALT 62  ALKPHOS 53  BILITOT 0.8  PROT 6.7  ALBUMIN 3.7   CBC:  Recent Labs Lab 11/24/15 1720 11/24/15 2355  11/25/15 0642 11/25/15 1443 11/26/15 0640  WBC 11.2*  --   --   --  4.8  NEUTROABS 6.6  --   --   --   --   HGB 10.7* 9.8* 9.6* 9.7* 9.1*  HCT 30.3* 27.6* 27.1* 27.3* 26.0*  MCV 91.0  --   --   --  91.2  PLT 278  --   --   --  167   CBG:  Recent Labs Lab 11/25/15 1357 11/25/15 1733 11/25/15 2023 11/26/15 0800 11/26/15 1210  GLUCAP 198* 224* 200* 189* 229*    Studies: No results found.  Scheduled Meds: . insulin aspart  0-9 Units Subcutaneous TID WC  . pantoprazole (PROTONIX) IV  40 mg Intravenous Q12H  . sodium chloride flush  3 mL Intravenous Q12H  . sucralfate  1 g Oral TID   Continuous Infusions: . sodium chloride 75 mL/hr at 11/26/15 1208    Active Problems:   Hypertension   Diabetes mellitus ([facility])   Chronic low back pain   Severe dehydration   AKI (acute kidney injury) (H[facility])   Melena   Hyponatremia   Leukocytosis   GI bleed    Time spent: 25 minutes   Vassie Loll  [facility] Pager [phone]. If 7PM-7AM, please contact night-coverage at [email or web address], password [facility] 11/26/2015, 3:55 PM  LOS: 2 days

## 2015-11-27 DIAGNOSIS — K2100 Gastro-esophageal reflux disease with esophagitis, without bleeding: Secondary | ICD-10-CM | POA: Insufficient documentation

## 2015-11-27 DIAGNOSIS — D62 Acute posthemorrhagic anemia: Secondary | ICD-10-CM

## 2015-11-27 LAB — CBC
HCT: 27.5 % — ABNORMAL LOW (ref 39.0–52.0)
Hemoglobin: 9.6 g/dL — ABNORMAL LOW (ref 13.0–17.0)
MCH: 31.9 pg (ref 26.0–34.0)
MCHC: 34.9 g/dL (ref 30.0–36.0)
MCV: 91.4 fL (ref 78.0–100.0)
Platelets: 189 10*3/uL (ref 150–400)
RBC: 3.01 MIL/uL — ABNORMAL LOW (ref 4.22–5.81)
RDW: 13.1 % (ref 11.5–15.5)
WBC: 6.2 10*3/uL (ref 4.0–10.5)

## 2015-11-27 LAB — GLUCOSE, CAPILLARY: Glucose-Capillary: 196 mg/dL — ABNORMAL HIGH (ref 65–99)

## 2015-11-27 MED ORDER — ASPIRIN EC 81 MG PO TBEC: 81.0000 mg | DELAYED_RELEASE_TABLET | Freq: Every day | ORAL | Status: DC

## 2015-11-27 MED ORDER — POLYSACCHARIDE IRON COMPLEX 150 MG PO CAPS: 150.0000 mg | ORAL_CAPSULE | Freq: Every day | ORAL | Status: DC

## 2015-11-27 MED ORDER — PANTOPRAZOLE SODIUM 40 MG PO TBEC: 40.0000 mg | DELAYED_RELEASE_TABLET | Freq: Two times a day (BID) | ORAL | Status: DC

## 2015-11-27 MED ORDER — LISINOPRIL-HYDROCHLOROTHIAZIDE 20-25 MG PO TABS: 1.0000 | ORAL_TABLET | Freq: Every day | ORAL | Status: DC

## 2015-11-27 MED ORDER — SUCRALFATE 1 GM/10ML PO SUSP: 1.0000 g | Freq: Three times a day (TID) | ORAL | Status: DC

## 2015-11-27 NOTE — Progress Notes (Signed)
1320 d/c instructions, paperwork and hard Rxs given to patient. IV catheter removed from LEFT FA, intact w/no s/s of infection noted, patient tolerated well. Telemetry box and wiring removed from patient and central telemetry notified and made aware. Patient confirmed he had all his belongings that he came to the house with and left ambulatory accompanied by a friend that is to transport patient home.

## 2015-11-27 NOTE — Progress Notes (Signed)
1610 Central telemetry called to report ST with HR in 140's. MD notified.

## 2015-11-27 NOTE — Discharge Summary (Signed)
Physician Discharge Summary  Gregory Duran TGB:[id number] DOB: 05/03/1961 DOA: 11/24/2015  PCP: Milana Obey, MD  Admit date: 11/24/2015 Discharge date: 11/27/2015  Time spent: 35 minutes  Recommendations for Outpatient Follow-up:  1. Reassess blood pressure and adjust antihypertensive regimen as needed 2. Please arrange outpatient follow-up with a cardiologist for further evaluation and treatment of ongoing sinus tachycardia 3. Repeat CBC to follow hemoglobin trend 4. Repeat basic metabolic panel to follow electrolytes and renal function.  Discharge Diagnoses:  Active Problems:   Hypertension   Diabetes mellitus (HCC)   Chronic low back pain   Severe dehydration   AKI (acute kidney injury) (HCC)   Melena   Hyponatremia   Leukocytosis   GI bleed   Controlled type 2 diabetes mellitus with complication, without long-term current use of insulin (HCC)   Discharge Condition: Stable and improved. Patient has been discharged home with instruction to follow with PCP in the next 10 days. GI service will also arrange outpatient follow-up in the upcoming 2-3 weeks to repeat endoscopy and assess healing of gastric ulcer.  Diet recommendation: heart healthy and modified carbohydrates  Filed Weights   11/25/15 0508 11/25/15 1229 11/27/15 0623  Weight: 108.591 kg (239 lb 6.4 oz) 108.41 kg (239 lb) 108.364 kg (238 lb 14.4 oz)    History of present illness:  As per Dr.Opyd H&P written on 11/24/15 55 y.o. male with PMH of hypertension, type 2 diabetes mellitus, and chronic low back pain who presents the ED with 3 days of nausea, nonbloody vomiting, diarrhea, and melena. Patient reports pain in his usual state of fairly good health until approximately 3 days ago when he developed generalized weakness with nausea, vomiting, and diarrhea. He also notes diffuse body aches but denies subjective fevers or chills. 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 He denies any abdominal pain per se, but there has been nausea, vomiting, and diarrhea and stools have been black and tarry. He denies any history of similar symptoms. He is not known to have peptic ulcers, denies alcohol use, and denies NSAID use. He has been unable to identify any alleviating or exacerbating factors and has not tried any interventions before coming to the ED. He endorses an acute exacerbation of his chronic low back pain that he attributes to vomiting. He denies chest pain, palpitations, headaches, lightheadedness, or confusion. He has been taking sips of Gatorade, but has otherwise been unable to tolerate PO intake for the past 3 days.  In ED, patient was found to be afebrile, saturating well on room air, with sinus tachycardia in the 110s and marginal blood pressure. DRE was met with brown, weakly FOBT positive stool. CBC featured a normocytic anemia with hemoglobin of 10.7 and leukocytosis to 11,200. CMP features multiple derangements with a serum creatinine of 1.66, up from 0.8 on prior chem panel. There is also a hyponatremia and hypochloremia, and BUN is elevated to 84. Patient was bolused with 2 L of normal saline in the emergency department, remained stable, and will be admitted for ongoing evaluation and management of suspected acute gastroenteritis with melena and dehydration.  Hospital Course:  1. GI bleed with acute blood loss anemia  - Tarry stool x3 days; stool brown and FOBT + on DRE during admission   - Hgb 10.7; last CBC on file is from 03/2014 and Hgb was 15.9 at that time  -after IVF's resuscitation down to  9.1 -no further melena or overt bleeding during hospitalization  -EGD done and demonstrating erosive esophagitis and antral ulcer -H. Pylori serology ordered and pending at discharge; will be follow by GI service -will continue PPI BID and encourage to avoid NSAIDs  -will continue carafate for at least 2  weeks -discharge on niferex to help with anemia  2. Acute gastroenteritis  - 3 days of N/V/diarrhea with malaise and generalized aches suggestive of acute viral GE  - much better after fluid resuscitation; denies any further episode of nausea, vomiting and diarrhea.  - GI panel by PCR neg - Electrolytes repleted and WNL at discharge  3. Acute kidney injury  - SCr 1.66, up from 0.8 at last measure (July 2015)  - Suspect this is an acute prerenal azotemia in setting of acute gastroenteritis and subsequent dehydration. -Back to normal range after fluid resuscitation  -will continue holding nephrotoxic agents for now -avoid hypotension  -Basic metabolic panel to be follow during his PCP visit to reassess renal function electrolytes trend  4. Hyponatremia  -Due to dehydration and a home use of HCTZ -Improved/resolved with IV fluids -follow electrolytes trend  5. Leukocytosis - Secondary to stress demargination and dehydration  - No signs of bacterial infection at this time and no antibiotics were required. - Wbc's back to normal at discharge.  6. Type II DM -Okay to resume home oral hypoglycemic regimen - A1c 7.6 - Continue Carb-modified diet   7. Hypertension  - BP continue to be soft but stable - will continue holding lisinopril-HCTZ until follow up with his PCP. - Cautiously resume BP meds as needed   8. Tachycardia: -Will benefit of outpatient follow-up with cardiology service -Patient might require the use of beta blocker or calcium channel blocker with intention to control heart rate and blood pressure.  -He is currently asymptomatic and his tachycardia is sinus in nature. Patient has decided not to initiate any medication until he follows up with his primary care physician.  Procedures: EGD: 2/24 -erosive reflux esophagitis  -Antral gastritis with 6 mm prepyloric ulcer with clean base.  Consultations:  GI service  Discharge Exam: Filed Vitals:    11/26/15 2231 11/27/15 0623  BP: 131/81 101/63  Pulse: 130 121  Temp: 98.2 F (36.8 C) 98.1 F (36.7 C)  Resp: 20 20    General: Afebrile, no CP, no SOB. Feeling better overall. Denies abd pain, nausea and vomiting. No further diarrhea and no further bleeding appreciated. Patient back pain stable.  Cardiovascular: S1 and S2, no rubs, no gallop  Respiratory: good air movement, no wheezing  Abdomen: soft, NT, ND, positive BS  Musculoskeletal: no edema, no cyanosis   Discharge Instructions   Discharge Instructions    Diet - low sodium heart healthy    Complete by:  As directed      Discharge instructions    Complete by:  As directed   Keep yourself well hydrated Follow a heart healthy and modifIED carbohydrates diet Avoid the use of NSAIDs and to stop your aspirin for the next 2-3 weeks. Arrange Outpatient follow-up with PCP in approximately 5 days Follow with gastroenterology service as instructed. (Office will contact you to provide follow-up appointment details).          Current Discharge Medication List    START taking these medications   Details  iron polysaccharides (NIFEREX) 150 MG capsule Take 1 capsule (150 mg total) by mouth daily. Qty: 30 capsule, Refills: 1    pantoprazole (PROTONIX) 40  MG tablet Take 1 tablet (40 mg total) by mouth 2 (two) times daily. Qty: 60 tablet, Refills: 1    sucralfate (CARAFATE) 1 GM/10ML suspension Take 10 mLs (1 g total) by mouth 3 (three) times daily. Qty: 420 mL, Refills: 0      CONTINUE these medications which have CHANGED   Details  aspirin EC 81 MG tablet Take 1 tablet (81 mg total) by mouth daily. DISCONTINUE FOR 2-3 WEEKS; UNTIL FOLLOW UP WITH GASTROENTEROLOGIST    lisinopril-hydrochlorothiazide (PRINZIDE,ZESTORETIC) 20-25 MG tablet Take 1 tablet by mouth daily. HOLD UNTIL FOLLOW UP WITH PCP.      CONTINUE these medications which have NOT CHANGED   Details  glipiZIDE (GLUCOTROL) 5 MG tablet Take 10 mg by mouth 2  (two) times daily.     metFORMIN (GLUCOPHAGE) 500 MG tablet Take 500 mg by mouth 2 (two) times daily with a meal.    Oxycodone HCl 10 MG TABS Take 10 mg by mouth 4 (four) times daily as needed. Refills: 0    cyclobenzaprine (FLEXERIL) 10 MG tablet Take 1 tablet (10 mg total) by mouth 3 (three) times daily as needed. Qty: 21 tablet, Refills: 0       No Known Allergies Follow-up Information    Follow up with Robert Bellow, MD. Schedule an appointment as soon as possible for a visit in 5 days.   Specialty:  Family Medicine   Contact information:   [address] [place] [address] [phone]       The results of significant diagnostics from this hospitalization (including imaging, microbiology, ancillary and laboratory) are listed below for reference.    Labs: Basic Metabolic Panel:  Recent Labs Lab 11/24/15 1720 11/25/15 0222 11/26/15 0640  NA 133* 135 137  K 3.9 3.8 4.3  CL 100* 104 104  CO2 19* 22 28  GLUCOSE 131* 173* 214*  BUN 84* 59* 21*  CREATININE 1.66* 1.02 0.84  CALCIUM 10.8* 9.4 8.3*   Liver Function Tests:  Recent Labs Lab 11/24/15 1720  AST 44*  ALT 62  ALKPHOS 53  BILITOT 0.8  PROT 6.7  ALBUMIN 3.7   CBC:  Recent Labs Lab 11/24/15 1720 11/24/15 2355 11/25/15 0642 11/25/15 1443 11/26/15 0640 11/27/15 0633  WBC 11.2*  --   --   --  4.8 6.2  NEUTROABS 6.6  --   --   --   --   --   HGB 10.7* 9.8* 9.6* 9.7* 9.1* 9.6*  HCT 30.3* 27.6* 27.1* 27.3* 26.0* 27.5*  MCV 91.0  --   --   --  91.2 91.4  PLT 278  --   --   --  167 189   CBG:  Recent Labs Lab 11/26/15 0800 11/26/15 1210 11/26/15 1632 11/26/15 2107 11/27/15 0841  GLUCAP 189* 229* 259* 258* 196*    Signed:  Barton Dubois MD.  [facility] 11/27/2015, 12:18 PM

## 2015-11-28 ENCOUNTER — Encounter (HOSPITAL_COMMUNITY): Payer: Self-pay | Admitting: Internal Medicine

## 2015-11-28 LAB — H. PYLORI ANTIBODY, IGG: H Pylori IgG: <0.9 U/mL (ref 0.0–0.8)

## 2015-12-31 ENCOUNTER — Encounter (HOSPITAL_COMMUNITY): Payer: Self-pay | Admitting: Emergency Medicine

## 2015-12-31 ENCOUNTER — Emergency Department (HOSPITAL_COMMUNITY)
Admission: EM | Admit: 2015-12-31 | Discharge: 2015-12-31 | Disposition: A | Discharge status: Home / Self Care | Payer: No Typology Code available for payment source | Attending: Emergency Medicine | Admitting: Emergency Medicine

## 2015-12-31 DIAGNOSIS — Z7984 Long term (current) use of oral hypoglycemic drugs: Secondary | ICD-10-CM | POA: Insufficient documentation

## 2015-12-31 DIAGNOSIS — I10 Essential (primary) hypertension: Secondary | ICD-10-CM | POA: Insufficient documentation

## 2015-12-31 DIAGNOSIS — Z79899 Other long term (current) drug therapy: Secondary | ICD-10-CM | POA: Insufficient documentation

## 2015-12-31 DIAGNOSIS — M545 Low back pain, unspecified: Secondary | ICD-10-CM

## 2015-12-31 DIAGNOSIS — E119 Type 2 diabetes mellitus without complications: Secondary | ICD-10-CM | POA: Insufficient documentation

## 2015-12-31 DIAGNOSIS — Z7982 Long term (current) use of aspirin: Secondary | ICD-10-CM | POA: Insufficient documentation

## 2015-12-31 HISTORY — DX: Other chronic pain: G89.29

## 2015-12-31 HISTORY — DX: Sciatica, unspecified side: M54.30

## 2015-12-31 HISTORY — DX: Dorsalgia, unspecified: M54.9

## 2015-12-31 MED ORDER — MORPHINE SULFATE (PF) 2 MG/ML IV SOLN
8.0000 mg | Freq: Once | INTRAVENOUS | Status: AC
  Administered 2015-12-31: 8 mg via INTRAMUSCULAR
  Filled 2015-12-31: qty 4
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
## 2015-12-31 NOTE — Discharge Instructions (Signed)
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
## 2016-03-12 ENCOUNTER — Encounter (INDEPENDENT_AMBULATORY_CARE_PROVIDER_SITE_OTHER): Payer: Self-pay | Admitting: *Deleted

## 2016-04-02 ENCOUNTER — Encounter (HOSPITAL_COMMUNITY): Payer: Self-pay

## 2016-04-02 ENCOUNTER — Emergency Department (HOSPITAL_COMMUNITY)
Admission: EM | Admit: 2016-04-02 | Discharge: 2016-04-02 | Disposition: A | Discharge status: Home / Self Care | Payer: BLUE CROSS/BLUE SHIELD | Attending: Emergency Medicine | Admitting: Emergency Medicine

## 2016-04-02 DIAGNOSIS — M545 Low back pain, unspecified: Secondary | ICD-10-CM

## 2016-04-02 DIAGNOSIS — Z7984 Long term (current) use of oral hypoglycemic drugs: Secondary | ICD-10-CM | POA: Insufficient documentation

## 2016-04-02 DIAGNOSIS — E119 Type 2 diabetes mellitus without complications: Secondary | ICD-10-CM | POA: Diagnosis not present

## 2016-04-02 DIAGNOSIS — Z7982 Long term (current) use of aspirin: Secondary | ICD-10-CM | POA: Insufficient documentation

## 2016-04-02 DIAGNOSIS — I10 Essential (primary) hypertension: Secondary | ICD-10-CM | POA: Insufficient documentation

## 2016-04-02 MED ORDER — IBUPROFEN 800 MG PO TABS: 800.0000 mg | ORAL_TABLET | Freq: Three times a day (TID) | ORAL | Status: DC | PRN

## 2016-04-02 MED ORDER — PREDNISONE 10 MG (21) PO TBPK: 10.0000 mg | ORAL_TABLET | Freq: Every day | ORAL | Status: DC

## 2016-04-02 MED ORDER — KETOROLAC TROMETHAMINE 30 MG/ML IJ SOLN
60.0000 mg | Freq: Once | INTRAMUSCULAR | Status: AC
  Administered 2016-04-02: 60 mg via INTRAMUSCULAR
  Filled 2016-04-02: qty 2

## 2016-04-02 MED ORDER — METHOCARBAMOL 500 MG PO TABS: 500.0000 mg | ORAL_TABLET | Freq: Three times a day (TID) | ORAL | Status: DC | PRN

## 2016-04-02 MED ORDER — HYDROMORPHONE HCL 1 MG/ML IJ SOLN
1.0000 mg | Freq: Once | INTRAMUSCULAR | Status: AC
  Administered 2016-04-02: 1 mg via INTRAMUSCULAR
  Filled 2016-04-02: qty 1

## 2016-04-02 MED ORDER — METHOCARBAMOL 500 MG PO TABS
500.0000 mg | ORAL_TABLET | Freq: Once | ORAL | Status: AC
  Administered 2016-04-02: 500 mg via ORAL
  Filled 2016-04-02: qty 1

## 2016-04-02 MED ORDER — ONDANSETRON 4 MG PO TBDP
4.0000 mg | ORAL_TABLET | Freq: Once | ORAL | Status: AC
  Administered 2016-04-02: 4 mg via ORAL
  Filled 2016-04-02: qty 1

## 2016-04-02 MED ORDER — METHYLPREDNISOLONE SODIUM SUCC 125 MG IJ SOLR
125.0000 mg | Freq: Once | INTRAMUSCULAR | Status: AC
  Administered 2016-04-02: 125 mg via INTRAMUSCULAR
  Filled 2016-04-02: qty 2

## 2016-04-02 NOTE — ED Provider Notes (Addendum)
TIME SEEN: 1:00 AM  CHIEF COMPLAINT: Back pain  HPI: Pt is a 55 y.o. male with history of hypertension, diabetes, chronic back pain who presents to the emergency department with complaints of left lower back pain. Reports that he has chronic right-sided lower back pain that radiates down his right leg with chronic numbness in the right dorsal foot. Denies any new injury to his back. Pain is worse with movement, walking. Has had pain in the left side of his back before. Denies new numbness or focal weakness. No bowel or bladder incontinence. No urinary retention. No fever. No history of back surgery or epidural injection. States he is on oxycodone prescribed by his PCP Dr. Sudie BaileyKnowlton. Have this medication refilled today and has taken 40 mg of oxycodone at home without any relief of pain.  ROS: See HPI Constitutional: no fever  Eyes: no drainage  ENT: no runny nose   Cardiovascular:  no chest pain  Resp: no SOB  GI: no vomiting GU: no dysuria Integumentary: no rash  Allergy: no hives  Musculoskeletal: no leg swelling  Neurological: no slurred speech ROS otherwise negative  PAST MEDICAL HISTORY/PAST SURGICAL HISTORY:  Past Medical History  Diagnosis Date  . Hepatitis C     Patient states that he does not have hep C.  Will bring test results.  . Hypertension   . Diabetes mellitus (HCC)   . Chronic back pain   . Sciatica     MEDICATIONS:  Prior to Admission medications   Medication Sig Start Date End Date Taking? Authorizing Provider  aspirin EC 81 MG tablet Take 1 tablet (81 mg total) by mouth daily. DISCONTINUE FOR 2-3 WEEKS; UNTIL FOLLOW UP WITH GASTROENTEROLOGIST 11/27/15   Vassie Lollarlos Madera, MD  cyclobenzaprine (FLEXERIL) 10 MG tablet Take 1 tablet (10 mg total) by mouth 3 (three) times daily as needed. Patient not taking: Reported on 12/31/2015 01/12/14   Tammy Triplett, PA-C  glipiZIDE (GLUCOTROL) 5 MG tablet Take 10 mg by mouth 2 (two) times daily.     Historical Provider, MD  iron  polysaccharides (NIFEREX) 150 MG capsule Take 1 capsule (150 mg total) by mouth daily. 11/27/15   Vassie Lollarlos Madera, MD  lisinopril-hydrochlorothiazide (PRINZIDE,ZESTORETIC) 20-25 MG tablet Take 1 tablet by mouth daily. HOLD UNTIL FOLLOW UP WITH PCP. 11/27/15   Vassie Lollarlos Madera, MD  metFORMIN (GLUCOPHAGE) 500 MG tablet Take 500 mg by mouth 2 (two) times daily with a meal.    Historical Provider, MD  Oxycodone HCl 10 MG TABS Take 10 mg by mouth 4 (four) times daily as needed. 10/21/15   Historical Provider, MD  pantoprazole (PROTONIX) 40 MG tablet Take 1 tablet (40 mg total) by mouth 2 (two) times daily. 11/27/15   Vassie Lollarlos Madera, MD  sucralfate (CARAFATE) 1 GM/10ML suspension Take 10 mLs (1 g total) by mouth 3 (three) times daily. 11/27/15   Vassie Lollarlos Madera, MD    ALLERGIES:  No Known Allergies  SOCIAL HISTORY:  Social History  Substance Use Topics  . Smoking status: Never Smoker   . Smokeless tobacco: Never Used  . Alcohol Use: No     Comment: Patient states that he quit 3 1/2 years ago. Prior to thathe drank for a very long time.    FAMILY HISTORY: Family History  Problem Relation Age of Onset  . Heart disease Mother   . Heart disease Father   . Healthy Sister   . Hypertension Brother   . Hypertension Brother   . Hypertension Brother   .  Diabetes Brother     EXAM: BP 159/117 mmHg  Pulse 119  Temp(Src) 97.7 F (36.5 C) (Oral)  Resp 20  Ht 5\' 10"  (1.778 m)  Wt 250 lb (113.399 kg)  BMI 35.87 kg/m2  SpO2 98% CONSTITUTIONAL: Alert and oriented and responds appropriately to questions. Well-appearing; well-nourished, Appears uncomfortable HEAD: Normocephalic EYES: Conjunctivae clear, PERRL ENT: normal nose; no rhinorrhea; moist mucous membranes NECK: Supple, no meningismus, no LAD  CARD: Regular and tachycardic; S1 and S2 appreciated; no murmurs, no clicks, no rubs, no gallops RESP: Normal chest excursion without splinting or tachypnea; breath sounds clear and equal bilaterally; no  wheezes, no rhonchi, no rales, no hypoxia or respiratory distress, speaking full sentences ABD/GI: Normal bowel sounds; non-distended; soft, non-tender, no rebound, no guarding, no peritoneal signs BACK:  The back appears normal and is tender to palpation over the left lower lumbar paraspinal musculature and over the SI joint without erythema, warmth or swelling, there is no CVA tenderness, no midline spinal tenderness or step-off or deformity EXT: Normal ROM in all joints; non-tender to palpation; no edema; normal capillary refill; no cyanosis, no calf tenderness or swelling    SKIN: Normal color for age and race; warm; no rash NEURO: Moves all extremities equally, sensation to light touch intact diffusely, cranial nerves II through XII intact, 2+ deep tendon reflexes in bilateral lower extremities, ambulates with normal gait, able to stand on his toes and heels without any difficulty, no saddle anesthesia PSYCH: The patient's mood and manner are appropriate. Grooming and personal hygiene are appropriate.  MEDICAL DECISION MAKING: Patient here with exacerbation of his chronic back pain. No red flag symptoms. No neurologic deficits. No infectious symptoms. Does appear comfortable on exam. He is tachycardic and hypertensive. Reports history of sciatica. We'll treat symptomatically with IM Dilaudid, Toradol, Solu-Medrol and reassess. At this time I do not feel he needs emergent imaging of his back. Cauda equina, spinal stenosis, epidural abscess or hematoma, discitis or transverse myelitis.  ED PROGRESS: 2:15 AM  Pt's pain has not improved after above medications. Will repeat Dilaudid and give oral Robaxin.  3:05 AM  Pt reports his pain is 3/10.  Reports feeling much better and is ready for dc home.  We'll discharge patient home with steroid taper, ibuprofen, Robaxin. He already has oxycodone at home. Discussed with him return precautions. He has a PCP for follow-up. Discussed with him if symptoms are not  improving may need an MRI as an outpatient but I do not feel he needs it emergently as I do not feel there is any surgical emergency present. He verbalizes understanding and is comfortable with this plan. His girlfriend at bedside will drive him home.  Patient's heart rate in the low 100s after my recheck. Blood pressure has also improved.  At this time, I do not feel there is any life-threatening condition present. I have reviewed and discussed all results (EKG, imaging, lab, urine as appropriate), exam findings with patient. I have reviewed nursing notes and appropriate previous records.  I feel the patient is safe to be discharged home without further emergent workup. Discussed usual and customary return precautions. Patient and family (if present) verbalize understanding and are comfortable with this plan.  Patient will follow-up with their primary care provider. If they do not have a primary care provider, information for follow-up has been provided to them. All questions have been answered.      Layla MawKristen N Averill Winters, DO 04/02/16 0309  Layla MawKristen N Tinesha Siegrist, DO 04/02/16  0350 

## 2016-04-02 NOTE — Discharge Instructions (Signed)
Back Pain, Adult Back pain is very common in adults.The cause of back pain is rarely dangerous and the pain often gets better over time.The cause of your back pain may not be known. Some common causes of back pain include:  Strain of the muscles or ligaments supporting the spine.  Wear and tear (degeneration) of the spinal disks.  Arthritis.  Direct injury to the back. For many people, back pain may return. Since back pain is rarely dangerous, most people can learn to manage this condition on their own. HOME CARE INSTRUCTIONS Watch your back pain for any changes. The following actions may help to lessen any discomfort you are feeling:  Remain active. It is stressful on your back to sit or stand in one place for long periods of time. Do not sit, drive, or stand in one place for more than 30 minutes at a time. Take short walks on even surfaces as soon as you are able.Try to increase the length of time you walk each day.  Exercise regularly as directed by your health care provider. Exercise helps your back heal faster. It also helps avoid future injury by keeping your muscles strong and flexible.  Do not stay in bed.Resting more than 1-2 days can delay your recovery.  Pay attention to your body when you bend and lift. The most comfortable positions are those that put less stress on your recovering back. Always use proper lifting techniques, including:  Bending your knees.  Keeping the load close to your body.  Avoiding twisting.  Find a comfortable position to sleep. Use a firm mattress and lie on your side with your knees slightly bent. If you lie on your back, put a pillow under your knees.  Avoid feeling anxious or stressed.Stress increases muscle tension and can worsen back pain.It is important to recognize when you are anxious or stressed and learn ways to manage it, such as with exercise.  Take medicines only as directed by your health care provider. Over-the-counter  medicines to reduce pain and inflammation are often the most helpful.Your health care provider may prescribe muscle relaxant drugs.These medicines help dull your pain so you can more quickly return to your normal activities and healthy exercise.  Apply ice to the injured area:  Put ice in a plastic bag.  Place a towel between your skin and the bag.  Leave the ice on for 20 minutes, 2-3 times a day for the first 2-3 days. After that, ice and heat may be alternated to reduce pain and spasms.  Maintain a healthy weight. Excess weight puts extra stress on your back and makes it difficult to maintain good posture. SEEK MEDICAL CARE IF:  You have pain that is not relieved with rest or medicine.  You have increasing pain going down into the legs or buttocks.  You have pain that does not improve in one week.  You have night pain.  You lose weight.  You have a fever or chills. SEEK IMMEDIATE MEDICAL CARE IF:   You develop new bowel or bladder control problems.  You have unusual weakness or numbness in your arms or legs.  You develop nausea or vomiting.  You develop abdominal pain.  You feel faint.   This information is not intended to replace advice given to you by your health care provider. Make sure you discuss any questions you have with your health care provider.   Document Released: 09/17/2005 Document Revised: 10/08/2014 Document Reviewed: 01/19/2014 Elsevier Interactive Patient Education 2016 [employer] ° °Back Injury Prevention °Back injuries can be very painful. They can also be difficult to heal. After having one back injury, you are more likely to injure your back again. It is important to learn how to avoid injuring or re-injuring your back. The following tips can help you to prevent a back injury. °WHAT SHOULD I KNOW ABOUT PHYSICAL FITNESS? °· Exercise for 30 minutes per day on most days of the week or as directed by your health care provider. Make sure to: °¨ Do  aerobic exercises, such as walking, jogging, biking, or swimming. °¨ Do exercises that increase balance and strength, such as tai chi and yoga. These can decrease your risk of falling and injuring your back. °¨ Do stretching exercises to help with flexibility. °¨ Try to develop strong abdominal muscles. Your abdominal muscles provide a lot of the support that is needed by your back. °· Maintain a healthy weight.  This helps to decrease your risk of a back injury. °WHAT SHOULD I KNOW ABOUT MY DIET? °· Talk with your health care provider about your overall diet. Take supplements and vitamins only as directed by your health care provider. °· Talk with your health care provider about how much calcium and vitamin D you need each day. These nutrients help to prevent weakening of the bones (osteoporosis). Osteoporosis can cause broken (fractured) bones, which lead to back pain. °· Include good sources of calcium in your diet, such as dairy products, green leafy vegetables, and products that have had calcium added to them (fortified). °· Include good sources of vitamin D in your diet, such as milk and foods that are fortified with vitamin D. °WHAT SHOULD I KNOW ABOUT MY POSTURE? °· Sit up straight and stand up straight. Avoid leaning forward when you sit or hunching over when you stand. °· Choose chairs that have good low-back (lumbar) support. °· If you work at a desk, sit close to it so you do not need to lean over. Keep your chin tucked in. Keep your neck drawn back, and keep your elbows bent at a right angle. Your arms should look like the letter "L." °· Sit high and close to the steering wheel when you drive. Add a lumbar support to your car seat, if needed. °· Avoid sitting or standing in one position for very long. Take breaks to get up, stretch, and walk around at least one time every hour. Take breaks every hour if you are driving for long periods of time. °· Sleep on your side with your knees slightly bent, or  sleep on your back with a pillow under your knees. Do not lie on the front of your body to sleep. °WHAT SHOULD I KNOW ABOUT LIFTING, TWISTING, AND REACHING? °Lifting and Heavy Lifting °· Avoid heavy lifting, especially repetitive heavy lifting. If you must do heavy lifting: °¨ Stretch before lifting. °¨ Work slowly. °¨ Rest between lifts. °¨ Use a tool such as a cart or a dolly to move objects if one is available. °¨ Make several small trips instead of carrying one heavy load. °¨ Ask for help when you need it, especially when moving big objects. °· Follow these steps when lifting: °¨ Stand with your feet shoulder-width apart. °¨ Get as close to the object as you can. Do not try to pick up a heavy object that is far from your body. °¨ Use handles or lifting straps if they are available. °¨ Bend at your knees. Squat down, but keep your heels   off the floor. °¨ Keep your shoulders pulled back, your chin tucked in, and your back straight. °¨ Lift the object slowly while you tighten the muscles in your legs, abdomen, and buttocks. Keep the object as close to the center of your body as possible. °· Follow these steps when putting down a heavy load: °¨ Stand with your feet shoulder-width apart. °¨ Lower the object slowly while you tighten the muscles in your legs, abdomen, and buttocks. Keep the object as close to the center of your body as possible. °¨ Keep your shoulders pulled back, your chin tucked in, and your back straight. °¨ Bend at your knees. Squat down, but keep your heels off the floor. °¨ Use handles or lifting straps if they are available. °Twisting and Reaching °· Avoid lifting heavy objects above your waist. °· Do not twist at your waist while you are lifting or carrying a load. If you need to turn, move your feet. °· Do not bend over without bending at your knees. °· Avoid reaching over your head, across a table, or for an object on a high surface. °WHAT ARE SOME OTHER TIPS? °· Avoid wet floors and icy  ground. Keep sidewalks clear of ice to prevent falls. °· Do not sleep on a mattress that is too soft or too hard. °· Keep items that are used frequently within easy reach. °· Put heavier objects on shelves at waist level, and put lighter objects on lower or higher shelves. °· Find ways to decrease your stress, such as exercise, massage, or relaxation techniques. Stress can build up in your muscles. Tense muscles are more vulnerable to injury. °· Talk with your health care provider if you feel anxious or depressed. These conditions can make back pain worse. °· Wear flat heel shoes with cushioned soles. °· Avoid sudden movements. °· Use both shoulder straps when carrying a backpack. °· Do not use any tobacco products, including cigarettes, chewing tobacco, or electronic cigarettes. If you need help quitting, ask your health care provider. °  °This information is not intended to replace advice given to you by your health care provider. Make sure you discuss any questions you have with your health care provider. °  °Document Released: 10/25/2004 Document Revised: 02/01/2015 Document Reviewed: 09/21/2014 °Elsevier Interactive Patient Education ©2016 [employer] ° °

## 2016-04-02 NOTE — ED Notes (Signed)
Chronic back pain.  MD gave me oxycodone and I had it filled today.  It is not doing any good per pt.  Lower left sided back pain.

## 2017-02-09 ENCOUNTER — Encounter (HOSPITAL_COMMUNITY): Payer: Self-pay | Admitting: *Deleted

## 2017-02-09 ENCOUNTER — Emergency Department (HOSPITAL_COMMUNITY)
Admission: EM | Admit: 2017-02-09 | Discharge: 2017-02-10 | Disposition: A | Discharge status: Home / Self Care | Payer: 59 | Attending: Emergency Medicine | Admitting: Emergency Medicine

## 2017-02-09 ENCOUNTER — Emergency Department (HOSPITAL_COMMUNITY): Payer: 59

## 2017-02-09 DIAGNOSIS — Z7984 Long term (current) use of oral hypoglycemic drugs: Secondary | ICD-10-CM | POA: Insufficient documentation

## 2017-02-09 DIAGNOSIS — Z79899 Other long term (current) drug therapy: Secondary | ICD-10-CM | POA: Insufficient documentation

## 2017-02-09 DIAGNOSIS — I10 Essential (primary) hypertension: Secondary | ICD-10-CM | POA: Insufficient documentation

## 2017-02-09 DIAGNOSIS — R1031 Right lower quadrant pain: Secondary | ICD-10-CM | POA: Insufficient documentation

## 2017-02-09 DIAGNOSIS — R1030 Lower abdominal pain, unspecified: Secondary | ICD-10-CM

## 2017-02-09 DIAGNOSIS — E119 Type 2 diabetes mellitus without complications: Secondary | ICD-10-CM | POA: Insufficient documentation

## 2017-02-09 DIAGNOSIS — R531 Weakness: Secondary | ICD-10-CM | POA: Diagnosis not present

## 2017-02-09 DIAGNOSIS — R112 Nausea with vomiting, unspecified: Secondary | ICD-10-CM | POA: Insufficient documentation

## 2017-02-09 LAB — CBC WITH DIFFERENTIAL/PLATELET
Basophils Absolute: 0 10*3/uL (ref 0.0–0.1)
Basophils Relative: 0 %
Eosinophils Absolute: 0.2 10*3/uL (ref 0.0–0.7)
Eosinophils Relative: 2 %
HCT: 43.6 % (ref 39.0–52.0)
Hemoglobin: 15.6 g/dL (ref 13.0–17.0)
Lymphocytes Relative: 33 %
Lymphs Abs: 3 10*3/uL (ref 0.7–4.0)
MCH: 31.6 pg (ref 26.0–34.0)
MCHC: 35.8 g/dL (ref 30.0–36.0)
MCV: 88.3 fL (ref 78.0–100.0)
Monocytes Absolute: 1.1 10*3/uL — ABNORMAL HIGH (ref 0.1–1.0)
Monocytes Relative: 13 %
Neutro Abs: 4.8 10*3/uL (ref 1.7–7.7)
Neutrophils Relative %: 52 %
Platelets: 243 10*3/uL (ref 150–400)
RBC: 4.94 MIL/uL (ref 4.22–5.81)
RDW: 13.7 % (ref 11.5–15.5)
WBC: 9.2 10*3/uL (ref 4.0–10.5)

## 2017-02-09 LAB — URINALYSIS, ROUTINE W REFLEX MICROSCOPIC
Bilirubin Urine: NEGATIVE
Glucose, UA: NEGATIVE mg/dL
Hgb urine dipstick: NEGATIVE
Ketones, ur: NEGATIVE mg/dL
Leukocytes, UA: NEGATIVE
Nitrite: NEGATIVE
Protein, ur: NEGATIVE mg/dL
Specific Gravity, Urine: 1.015 (ref 1.005–1.030)
pH: 5 (ref 5.0–8.0)

## 2017-02-09 LAB — I-STAT CHEM 8, ED
BUN: 20 mg/dL (ref 6–20)
Calcium, Ion: 1.21 mmol/L (ref 1.15–1.40)
Chloride: 97 mmol/L — ABNORMAL LOW (ref 101–111)
Creatinine, Ser: 1 mg/dL (ref 0.61–1.24)
Glucose, Bld: 180 mg/dL — ABNORMAL HIGH (ref 65–99)
HCT: 45 % (ref 39.0–52.0)
Hemoglobin: 15.3 g/dL (ref 13.0–17.0)
Potassium: 3.5 mmol/L (ref 3.5–5.1)
Sodium: 135 mmol/L (ref 135–145)
TCO2: 26 mmol/L (ref 0–100)

## 2017-02-09 LAB — COMPREHENSIVE METABOLIC PANEL
ALT: 61 U/L (ref 17–63)
AST: 47 U/L — ABNORMAL HIGH (ref 15–41)
Albumin: 3.9 g/dL (ref 3.5–5.0)
Alkaline Phosphatase: 72 U/L (ref 38–126)
Anion gap: 10 (ref 5–15)
BUN: 19 mg/dL (ref 6–20)
CO2: 26 mmol/L (ref 22–32)
Calcium: 10 mg/dL (ref 8.9–10.3)
Chloride: 99 mmol/L — ABNORMAL LOW (ref 101–111)
Creatinine, Ser: 0.87 mg/dL (ref 0.61–1.24)
GFR calc Af Amer: >60 mL/min (ref >60)
GFR calc non Af Amer: >60 mL/min (ref >60)
Glucose, Bld: 181 mg/dL — ABNORMAL HIGH (ref 65–99)
Potassium: 3.6 mmol/L (ref 3.5–5.1)
Sodium: 135 mmol/L (ref 135–145)
Total Bilirubin: 1.3 mg/dL — ABNORMAL HIGH (ref 0.3–1.2)
Total Protein: 7.3 g/dL (ref 6.5–8.1)

## 2017-02-09 LAB — LIPASE, BLOOD: Lipase: 31 U/L (ref 11–51)

## 2017-02-09 MED ORDER — HYDROMORPHONE HCL 1 MG/ML IJ SOLN
1.0000 mg | Freq: Once | INTRAMUSCULAR | Status: AC
  Administered 2017-02-09: 1 mg via INTRAVENOUS
  Filled 2017-02-09: qty 1

## 2017-02-09 MED ORDER — IOPAMIDOL (ISOVUE-300) INJECTION 61%
100.0000 mL | Freq: Once | INTRAVENOUS | Status: AC | PRN
  Administered 2017-02-09: 100 mL via INTRAVENOUS

## 2017-02-09 MED ORDER — ONDANSETRON HCL 4 MG/2ML IJ SOLN
4.0000 mg | Freq: Once | INTRAMUSCULAR | Status: AC
  Administered 2017-02-09: 4 mg via INTRAVENOUS
  Filled 2017-02-09: qty 2

## 2017-02-09 MED ORDER — ONDANSETRON 4 MG PO TBDP: ORAL_TABLET | ORAL | 0 refills | Status: DC

## 2017-02-09 MED ORDER — SODIUM CHLORIDE 0.9 % IV BOLUS (SEPSIS)
1000.0000 mL | Freq: Once | INTRAVENOUS | Status: AC
  Administered 2017-02-09: 1000 mL via INTRAVENOUS

## 2017-02-09 MED ORDER — HYDROMORPHONE HCL 1 MG/ML IJ SOLN
1.0000 mg | Freq: Once | INTRAMUSCULAR | Status: AC
  Administered 2017-02-10: 1 mg via INTRAVENOUS
  Filled 2017-02-09: qty 1

## 2017-02-09 MED ORDER — HYDROXYZINE HCL 25 MG PO TABS: 50.0000 mg | ORAL_TABLET | Freq: Once | ORAL | Status: DC

## 2017-02-09 MED ORDER — ONDANSETRON HCL 4 MG/2ML IJ SOLN
4.0000 mg | Freq: Once | INTRAMUSCULAR | Status: AC
  Administered 2017-02-10: 4 mg via INTRAVENOUS
  Filled 2017-02-09: qty 2

## 2017-02-09 NOTE — Discharge Instructions (Signed)
Resume couple days drink plenty of fluids and follow-up with her doctor this week

## 2017-02-09 NOTE — ED Provider Notes (Signed)
[facility] Provider Note   CSN: [id number] Arrival date & time: 02/09/17  2024  By signing my name below, I, Modena Jansky, attest that this documentation has been prepared under the direction and in the presence of Bethann Berkshire, MD. Electronically Signed: Modena Jansky, Scribe. 02/09/2017. 9:08 PM.  History   Chief Complaint Chief Complaint  Patient presents with  . Abdominal Pain   The history is provided by the patient. No language interpreter was used.  Abdominal Pain   This is a new problem. The current episode started yesterday. The problem occurs constantly. The problem has not changed since onset.The pain is located in the LLQ and RLQ. The pain is moderate. Pertinent negatives include diarrhea, vomiting, frequency, hematuria and headaches. Nothing aggravates the symptoms. Nothing relieves the symptoms. Past workup does not include surgery. His past medical history is significant for GERD.   HPI Comments: Gregory Duran is a 56 y.o. male who presents to the Emergency Department complaining of constant moderate lower abdominal pain that started yesterday. No modifying factors. He reports associated dry heaves, generalized weakness, and lower right sciatica back pain (chronic). Denies any hx of abdominal surgeries, vomiting, diarrhea, or other complaints at this time.    PCP: Gareth Morgan, MD  Past Medical History:  Diagnosis Date  . Chronic back pain   . Diabetes mellitus (HCC)   . Hepatitis C    Patient states that he does not have hep C.  Will bring test results.  . Hypertension   . Sciatica     Patient Active Problem List   Diagnosis Date Noted  . Gastroesophageal reflux disease with esophagitis   . Acute blood loss anemia   . Controlled type 2 diabetes mellitus with complication, without long-term current use of insulin (HCC)   . Severe dehydration 11/24/2015  . AKI (acute kidney injury) (HCC) 11/24/2015  . Melena 11/24/2015  . Hyponatremia  11/24/2015  . Leukocytosis 11/24/2015  . GI bleed 11/24/2015  . Chronic hepatitis C (HCC) 05/05/2012  . Hypertension 05/05/2012  . Diabetes mellitus (HCC) 05/05/2012  . Chronic low back pain 05/05/2012  . Obesity 05/05/2012    Past Surgical History:  Procedure Laterality Date  . ESOPHAGOGASTRODUODENOSCOPY (EGD) WITH PROPOFOL N/A 11/25/2015   Procedure: ESOPHAGOGASTRODUODENOSCOPY (EGD) WITH PROPOFOL;  Surgeon: Malissa Hippo, MD;  Location: [facility];  Service: Endoscopy;  Laterality: N/A;  . KIDNEY STONE SURGERY         Home Medications    Prior to Admission medications   Medication Sig Start Date End Date Taking? Authorizing Provider  glipiZIDE (GLUCOTROL) 5 MG tablet Take 10 mg by mouth 2 (two) times daily.    Yes [provider]  iron polysaccharides (NIFEREX) 150 MG capsule Take 1 capsule (150 mg total) by mouth daily. 11/27/15  Yes Vassie Loll, MD  lisinopril-hydrochlorothiazide (PRINZIDE,ZESTORETIC) 20-25 MG tablet Take 1 tablet by mouth daily. HOLD UNTIL FOLLOW UP WITH PCP. 11/27/15  Yes Vassie Loll, MD  metFORMIN (GLUCOPHAGE) 500 MG tablet Take 1,000 mg by mouth 2 (two) times daily with a meal.    Yes [provider]  oxyCODONE (ROXICODONE) 15 MG immediate release tablet take 1 tablet by mouth UP TO four times a day if needed for SEVERE PAIN 12/17/16  Yes [provider]  pantoprazole (PROTONIX) 40 MG tablet Take 1 tablet (40 mg total) by mouth 2 (two) times daily. 11/27/15  Yes Vassie Loll, MD    Family History Family History  Problem Relation Age of  Onset  . Heart disease Mother   . Heart disease Father   . Healthy Sister   . Hypertension Brother   . Hypertension Brother   . Hypertension Brother   . Diabetes Brother     Social History Social History  Substance Use Topics  . Smoking status: Never Smoker  . Smokeless tobacco: Never Used  . Alcohol use No     Comment: Patient states that he quit 3 1/2 years ago. Prior to  thathe drank for a very long time.     Allergies   Fish allergy   Review of Systems Review of Systems  Constitutional: Negative for appetite change and fatigue.  HENT: Negative for congestion, ear discharge and sinus pressure.   Eyes: Negative for discharge.  Respiratory: Negative for cough.   Cardiovascular: Negative for chest pain.  Gastrointestinal: Positive for abdominal pain. Negative for diarrhea and vomiting.  Genitourinary: Negative for frequency and hematuria.  Musculoskeletal: Positive for back pain (Chronic).  Skin: Negative for rash.  Neurological: Positive for weakness (Generalized). Negative for seizures and headaches.  Psychiatric/Behavioral: Negative for hallucinations.     Physical Exam Updated Vital Signs BP (!) 148/93 (BP Location: Left Arm)   Pulse (!) 109   Temp 97.9 F (36.6 C) (Oral)   Resp 20   Ht 5\' 10"  (1.778 m)   Wt 240 lb (108.9 kg)   SpO2 98%   BMI 34.44 kg/m   Physical Exam  Constitutional: He is oriented to person, place, and time. He appears well-developed.  HENT:  Head: Normocephalic.  Eyes: Conjunctivae and EOM are normal. No scleral icterus.  Neck: Neck supple. No thyromegaly present.  Cardiovascular: Normal rate and regular rhythm.  Exam reveals no gallop and no friction rub.   No murmur heard. Pulmonary/Chest: No stridor. He has no wheezes. He has no rales. He exhibits no tenderness.  Abdominal: He exhibits no distension. There is tenderness. There is no rebound.  Mild TTP to the RLQ. TTP over the right sacrum.   Musculoskeletal: Normal range of motion. He exhibits no edema.  Lymphadenopathy:    He has no cervical adenopathy.  Neurological: He is oriented to person, place, and time. He exhibits normal muscle tone. Coordination normal.  Skin: No rash noted. No erythema.  Psychiatric: He has a normal mood and affect. His behavior is normal.     ED Treatments / Results  DIAGNOSTIC STUDIES: Oxygen Saturation is 98% on RA,  normal by my interpretation.    COORDINATION OF CARE: 9:12 PM- Pt advised of plan for treatment and pt agrees.  Labs (all labs ordered are listed, but only abnormal results are displayed) Labs Reviewed  CBC WITH DIFFERENTIAL/PLATELET - Abnormal; Notable for the following:       Result Value   Monocytes Absolute 1.1 (*)    All other components within normal limits  COMPREHENSIVE METABOLIC PANEL - Abnormal; Notable for the following:    Chloride 99 (*)    Glucose, Bld 181 (*)    AST 47 (*)    Total Bilirubin 1.3 (*)    All other components within normal limits  I-STAT CHEM 8, ED - Abnormal; Notable for the following:    Chloride 97 (*)    Glucose, Bld 180 (*)    All other components within normal limits  LIPASE, BLOOD  URINALYSIS, ROUTINE W REFLEX MICROSCOPIC    EKG  EKG Interpretation None       Radiology No results found.  Procedures Procedures (including critical care  time)  Medications Ordered in ED Medications  HYDROmorphone (DILAUDID) injection 1 mg (1 mg Intravenous Given 02/09/17 2150)  ondansetron (ZOFRAN) injection 4 mg (4 mg Intravenous Given 02/09/17 2148)  sodium chloride 0.9 % bolus 1,000 mL (1,000 mLs Intravenous New Bag/Given 02/09/17 2147)  iopamidol (ISOVUE-300) 61 % injection 100 mL (100 mLs Intravenous Contrast Given 02/09/17 2233)     Initial Impression / Assessment and Plan / ED Course  I have reviewed the triage vital signs and the nursing notes.  Pertinent labs & imaging results that were available during my care of the patient were reviewed by me and considered in my medical decision making (see chart for details).     Patient with abdominal pain nausea vomiting which has improved. CT scan unremarkable. Suspect viral syndrome. Patient will be sent home with Zofran will follow-up with his PCP Final Clinical Impressions(s) / ED Diagnoses   Final diagnoses:  None    New Prescriptions New Prescriptions   No medications on file   The  chart was scribed for me under my direct supervision.  I personally performed the history, physical, and medical decision making and all procedures in the evaluation of this patient.Bethann Berkshire.     Supreme Rybarczyk, MD 02/09/17 2348

## 2017-02-09 NOTE — ED Triage Notes (Addendum)
Pt reports lower abdominal pain with n/v. Pt reports right sided back pain (sciatica) and bilateral leg weakness.

## 2017-02-10 NOTE — ED Notes (Signed)
Pt stated that he has a ride waiting for him up front.   Pt rolled up front .

## 2017-04-25 ENCOUNTER — Encounter (HOSPITAL_COMMUNITY): Payer: Self-pay | Admitting: Emergency Medicine

## 2017-04-25 ENCOUNTER — Emergency Department (HOSPITAL_COMMUNITY): Payer: 59

## 2017-04-25 ENCOUNTER — Emergency Department (HOSPITAL_COMMUNITY)
Admission: EM | Admit: 2017-04-25 | Discharge: 2017-04-25 | Disposition: A | Discharge status: Home / Self Care | Payer: 59 | Attending: Emergency Medicine | Admitting: Emergency Medicine

## 2017-04-25 DIAGNOSIS — E119 Type 2 diabetes mellitus without complications: Secondary | ICD-10-CM | POA: Insufficient documentation

## 2017-04-25 DIAGNOSIS — I10 Essential (primary) hypertension: Secondary | ICD-10-CM | POA: Diagnosis not present

## 2017-04-25 DIAGNOSIS — M5441 Lumbago with sciatica, right side: Secondary | ICD-10-CM | POA: Insufficient documentation

## 2017-04-25 DIAGNOSIS — Z79899 Other long term (current) drug therapy: Secondary | ICD-10-CM | POA: Insufficient documentation

## 2017-04-25 DIAGNOSIS — G8929 Other chronic pain: Secondary | ICD-10-CM | POA: Insufficient documentation

## 2017-04-25 DIAGNOSIS — Z7984 Long term (current) use of oral hypoglycemic drugs: Secondary | ICD-10-CM | POA: Insufficient documentation

## 2017-04-25 DIAGNOSIS — M545 Low back pain: Secondary | ICD-10-CM | POA: Diagnosis present

## 2017-04-25 MED ORDER — MORPHINE SULFATE (PF) 4 MG/ML IV SOLN
8.0000 mg | Freq: Once | INTRAVENOUS | Status: AC
  Administered 2017-04-25: 8 mg via INTRAMUSCULAR
  Filled 2017-04-25: qty 2

## 2017-04-25 MED ORDER — KETOROLAC TROMETHAMINE 30 MG/ML IJ SOLN
30.0000 mg | Freq: Once | INTRAMUSCULAR | Status: AC
  Administered 2017-04-25: 30 mg via INTRAVENOUS
  Filled 2017-04-25: qty 1

## 2017-04-25 MED ORDER — HYDROMORPHONE HCL 1 MG/ML IJ SOLN
1.0000 mg | Freq: Once | INTRAMUSCULAR | Status: AC
  Administered 2017-04-25: 1 mg via INTRAVENOUS
  Filled 2017-04-25: qty 1

## 2017-04-25 MED ORDER — HYDROMORPHONE HCL 2 MG/ML IJ SOLN
2.0000 mg | Freq: Once | INTRAMUSCULAR | Status: AC
  Administered 2017-04-25: 2 mg via INTRAVENOUS
  Filled 2017-04-25: qty 1

## 2017-04-25 MED ORDER — DICLOFENAC SODIUM 75 MG PO TBEC: 75.0000 mg | DELAYED_RELEASE_TABLET | Freq: Two times a day (BID) | ORAL | 0 refills | Status: DC

## 2017-04-25 MED ORDER — ONDANSETRON 8 MG PO TBDP
8.0000 mg | ORAL_TABLET | Freq: Once | ORAL | Status: AC
  Administered 2017-04-25: 8 mg via ORAL
  Filled 2017-04-25: qty 1

## 2017-04-25 MED ORDER — METHOCARBAMOL 500 MG PO TABS: 500.0000 mg | ORAL_TABLET | Freq: Three times a day (TID) | ORAL | 0 refills | Status: DC

## 2017-04-25 MED ORDER — LORAZEPAM 2 MG/ML IJ SOLN
1.0000 mg | Freq: Once | INTRAMUSCULAR | Status: AC
  Administered 2017-04-25: 1 mg via INTRAVENOUS
  Filled 2017-04-25: qty 1

## 2017-04-25 NOTE — ED Provider Notes (Signed)
[facility]-EM[facility] DEPT Provider Note   CSN: [id number] Arrival date & time: 04/25/17  1002     History   Chief Complaint Chief Complaint  Patient presents with  . Back Pain    HPI Laurina BustleWilliam A Pak is a 56 y.o. male.  HPI   Laurina BustleWilliam A Imel is a 56 y.o. male with hx of chronic low back pain, presents to the Emergency Department complaining of recurrent right lower back pain for 4 days.  Describes pain as similar to previous pain except intensity this time is greater.  He admits to intermittent burning pain to the lateral right lower leg, but thigh area is spared.  Pt takes 15 mg oxycodone daily for back pain.  He denies recent injury, abd pain, numbness or weakness to the LE's, urine or bowel changes.  Past Medical History:  Diagnosis Date  . Chronic back pain   . Diabetes mellitus (HCC)   . Hepatitis C    Patient states that he does not have hep C.  Will bring test results.  . Hypertension   . Sciatica     Patient Active Problem List   Diagnosis Date Noted  . Gastroesophageal reflux disease with esophagitis   . Acute blood loss anemia   . Controlled type 2 diabetes mellitus with complication, without long-term current use of insulin (HCC)   . Severe dehydration 11/24/2015  . AKI (acute kidney injury) (HCC) 11/24/2015  . Melena 11/24/2015  . Hyponatremia 11/24/2015  . Leukocytosis 11/24/2015  . GI bleed 11/24/2015  . Chronic hepatitis C (HCC) 05/05/2012  . Hypertension 05/05/2012  . Diabetes mellitus (HCC) 05/05/2012  . Chronic low back pain 05/05/2012  . Obesity 05/05/2012    Past Surgical History:  Procedure Laterality Date  . ESOPHAGOGASTRODUODENOSCOPY (EGD) WITH PROPOFOL N/A 11/25/2015   Procedure: ESOPHAGOGASTRODUODENOSCOPY (EGD) WITH PROPOFOL;  Surgeon: Malissa HippoNajeeb U Rehman, MD;  Location: [facility];  Service: Endoscopy;  Laterality: N/A;  . KIDNEY STONE SURGERY         Home Medications    Prior to Admission medications   Medication Sig Start Date End  Date Taking? Authorizing Provider  glipiZIDE (GLUCOTROL) 5 MG tablet Take 10 mg by mouth 2 (two) times daily.     [provider]  iron polysaccharides (NIFEREX) 150 MG capsule Take 1 capsule (150 mg total) by mouth daily. 11/27/15   Vassie LollMadera, Carlos, MD  lisinopril-hydrochlorothiazide (PRINZIDE,ZESTORETIC) 20-25 MG tablet Take 1 tablet by mouth daily. HOLD UNTIL FOLLOW UP WITH PCP. 11/27/15   Vassie LollMadera, Carlos, MD  metFORMIN (GLUCOPHAGE) 500 MG tablet Take 1,000 mg by mouth 2 (two) times daily with a meal.     [provider]  ondansetron (ZOFRAN ODT) 4 MG disintegrating tablet 4mg  ODT q4 hours prn nausea/vomit 02/09/17   Bethann BerkshireZammit, Joseph, MD  oxyCODONE (ROXICODONE) 15 MG immediate release tablet take 1 tablet by mouth UP TO four times a day if needed for SEVERE PAIN 12/17/16   [provider]  pantoprazole (PROTONIX) 40 MG tablet Take 1 tablet (40 mg total) by mouth 2 (two) times daily. 11/27/15   Vassie LollMadera, Carlos, MD    Family History Family History  Problem Relation Age of Onset  . Heart disease Mother   . Heart disease Father   . Healthy Sister   . Hypertension Brother   . Hypertension Brother   . Hypertension Brother   . Diabetes Brother     Social History Social History  Substance Use Topics  . Smoking status: Never Smoker  .  Smokeless tobacco: Never Used  . Alcohol use No     Comment: Patient states that he quit 3 1/2 years ago. Prior to thathe drank for a very long time.     Allergies   Fish allergy   Review of Systems Review of Systems  Constitutional: Negative for fever.  Respiratory: Negative for shortness of breath.   Gastrointestinal: Negative for abdominal pain, constipation and vomiting.  Genitourinary: Negative for decreased urine volume, difficulty urinating, dysuria, flank pain and hematuria.  Musculoskeletal: Positive for back pain. Negative for joint swelling.  Skin: Negative for rash.  Neurological: Negative for weakness and numbness.    All other systems reviewed and are negative.    Physical Exam Updated Vital Signs BP (!) 165/97   Pulse 90   Temp 97.6 F (36.4 C)   Resp 20   Ht 5\' 10"  (1.778 m)   Wt 113.4 kg (250 lb)   SpO2 98%   BMI 35.87 kg/m   Physical Exam  Constitutional: He is oriented to person, place, and time. He appears well-developed and well-nourished. No distress.  HENT:  Head: Normocephalic and atraumatic.  Neck: Normal range of motion. Neck supple.  Cardiovascular: Normal rate, regular rhythm, normal heart sounds and intact distal pulses.   No murmur heard. Pulmonary/Chest: Effort normal and breath sounds normal. No respiratory distress.  Abdominal: Soft. He exhibits no distension. There is no tenderness.  Musculoskeletal: He exhibits tenderness. He exhibits no edema.       Lumbar back: He exhibits tenderness and pain. He exhibits normal range of motion, no swelling, no deformity, no laceration and normal pulse.  ttp of the lower spine and right lumbar paraspinal muscles and SI joint.  Pt has 5/5 strength against resistance of bilateral lower extremities.     Neurological: He is alert and oriented to person, place, and time. He has normal strength. No sensory deficit. He exhibits normal muscle tone. Coordination and gait normal.  Reflex Scores:      Patellar reflexes are 2+ on the right side and 2+ on the left side.      Achilles reflexes are 2+ on the right side and 2+ on the left side. Skin: Skin is warm and dry. Capillary refill takes less than 2 seconds. No rash noted.  Nursing note and vitals reviewed.    ED Treatments / Results  Labs (all labs ordered are listed, but only abnormal results are displayed) Labs Reviewed - No data to display  EKG  EKG Interpretation None       Radiology Dg Lumbar Spine Complete  Result Date: 04/25/2017 CLINICAL DATA:  Chronic pain.  No reported injury. EXAM: LUMBAR SPINE - COMPLETE 4+ VIEW COMPARISON:  CT 02/09/2017 . FINDINGS: Mild  scoliosis concave right. No acute bony abnormality identified. Diffuse degenerative change. Calcific density noted projected the right kidney consistent nephrolithiasis. Aortoiliac atherosclerotic vascular calcification. IMPRESSION: 1. Mild scoliosis concave right. Diffuse multilevel prominent degenerative change. No acute bony abnormality. 2. Nephrolithiasis. 3. Aortoiliac atherosclerotic vascular disease. Electronically Signed   By: Maisie Fushomas  Register   On: 04/25/2017 13:15     Procedures Procedures (including critical care time)  Medications Ordered in ED Medications - No data to display   Initial Impression / Assessment and Plan / ED Course  I have reviewed the triage vital signs and the nursing notes.  Pertinent labs & imaging results that were available during my care of the patient were reviewed by me and considered in my medical decision making (see chart  for details).     Pt reviewed on the [facility], received #120 oxycodone 15 mg tabs on 04/22/17 which appears to be a monthly prescription  Pt required multiple doses of pain medication to receive improvement of the back pain, pt also seen by Dr. Adriana Simas and care plan was discussed.  His vitals remain stable.  Pt is alert, ambulates with steady gait.  No concerning sx's for emergent neurological process.  Appears stable for d/c, return precautions discussed.  Pt given referral for neurosurgery and likely need for MRI.    Final Clinical Impressions(s) / ED Diagnoses   Final diagnoses:  Chronic right-sided low back pain with right-sided sciatica    New Prescriptions New Prescriptions   No medications on file     Rosey Bath 04/29/17 2120    Donnetta Hutching, MD 05/02/17 1320

## 2017-04-25 NOTE — ED Triage Notes (Signed)
Pt c/o chronic back pain since 2010. C/o worsening back pain x 4 days. Denies any gi/gu sx.

## 2017-04-25 NOTE — ED Notes (Addendum)
Pt made aware to return if symptoms worsen or if any life threatening symptoms occur. Dr. Ranae PalmsYelverton okay with d/c bp at this time. Pt told to continue taking bp medication and if it continues to stay high to see pcp.  Pt verbalized understanding of d/c instructions. IV was removed by primary nurse.

## 2017-04-25 NOTE — Discharge Instructions (Signed)
Apply ice packs on/off to your back.  Avoid bending and twisting movements.  Follow-up with your primary doctor or contact the neurosurgeon's office listed

## 2017-11-12 ENCOUNTER — Other Ambulatory Visit: Payer: Self-pay

## 2017-11-12 ENCOUNTER — Emergency Department (HOSPITAL_COMMUNITY): Payer: Self-pay

## 2017-11-12 ENCOUNTER — Emergency Department (HOSPITAL_COMMUNITY)
Admission: EM | Admit: 2017-11-12 | Discharge: 2017-11-12 | Disposition: A | Discharge status: Home / Self Care | Payer: Self-pay | Attending: Emergency Medicine | Admitting: Emergency Medicine

## 2017-11-12 ENCOUNTER — Encounter (HOSPITAL_COMMUNITY): Payer: Self-pay | Admitting: *Deleted

## 2017-11-12 DIAGNOSIS — L03031 Cellulitis of right toe: Secondary | ICD-10-CM | POA: Insufficient documentation

## 2017-11-12 DIAGNOSIS — I10 Essential (primary) hypertension: Secondary | ICD-10-CM | POA: Insufficient documentation

## 2017-11-12 DIAGNOSIS — E119 Type 2 diabetes mellitus without complications: Secondary | ICD-10-CM | POA: Insufficient documentation

## 2017-11-12 DIAGNOSIS — L97511 Non-pressure chronic ulcer of other part of right foot limited to breakdown of skin: Secondary | ICD-10-CM

## 2017-11-12 DIAGNOSIS — Z7984 Long term (current) use of oral hypoglycemic drugs: Secondary | ICD-10-CM | POA: Insufficient documentation

## 2017-11-12 DIAGNOSIS — Z79899 Other long term (current) drug therapy: Secondary | ICD-10-CM | POA: Insufficient documentation

## 2017-11-12 MED ORDER — DOXYCYCLINE HYCLATE 100 MG PO CAPS: 100.0000 mg | ORAL_CAPSULE | Freq: Two times a day (BID) | ORAL | 0 refills | Status: DC

## 2017-11-12 NOTE — ED Triage Notes (Signed)
Pt reports sore to the bottom of his right great toe x 1 month. Pt reports this happened one evening when he was drinking and he doesn't know if he stepped on something or what exactly happened. Pt has been cleaning it with peroxide but says that one spot on his toe "just wont' heal". Pt reports clear discharge from the sore.

## 2017-11-12 NOTE — Discharge Instructions (Addendum)
X-ray show no infection in your bone.  Recommend warm salt water soaks 2 times a day.  Clean white socks.  Antibiotic for 10 days.  Elevate foot.  Return for worsening infection.

## 2017-11-12 NOTE — ED Provider Notes (Signed)
[facility] EMERGENCY DEPARTMENT Provider Note   CSN: [id number] Arrival date & time: 11/12/17  4098     History   Chief Complaint Chief Complaint  Patient presents with  . Toe Injury    HPI Gregory Duran is a 57 y.o. male.  Patient scraped the bottom of his right great toe approximately 1 month ago.  He now has a minimally draining ulcer on the bottom of the toe and some toe erythema.  No fever, sweats, chills.  He is diabetic.  He has done nothing at this point for the wound.  Severity is moderate.      Past Medical History:  Diagnosis Date  . Chronic back pain   . Diabetes mellitus (HCC)   . Hepatitis C    Patient states that he does not have hep C.  Will bring test results.  . Hypertension   . Sciatica     Patient Active Problem List   Diagnosis Date Noted  . Gastroesophageal reflux disease with esophagitis   . Acute blood loss anemia   . Controlled type 2 diabetes mellitus with complication, without long-term current use of insulin (HCC)   . Severe dehydration 11/24/2015  . AKI (acute kidney injury) (HCC) 11/24/2015  . Melena 11/24/2015  . Hyponatremia 11/24/2015  . Leukocytosis 11/24/2015  . GI bleed 11/24/2015  . Chronic hepatitis C (HCC) 05/05/2012  . Hypertension 05/05/2012  . Diabetes mellitus (HCC) 05/05/2012  . Chronic low back pain 05/05/2012  . Obesity 05/05/2012    Past Surgical History:  Procedure Laterality Date  . ESOPHAGOGASTRODUODENOSCOPY (EGD) WITH PROPOFOL N/A 11/25/2015   Procedure: ESOPHAGOGASTRODUODENOSCOPY (EGD) WITH PROPOFOL;  Surgeon: Malissa Hippo, MD;  Location: [facility];  Service: Endoscopy;  Laterality: N/A;  . KIDNEY STONE SURGERY         Home Medications    Prior to Admission medications   Medication Sig Start Date End Date Taking? Authorizing Provider  glipiZIDE (GLUCOTROL) 10 MG tablet Take 10 mg by mouth 2 (two) times daily.    Yes [provider]  lisinopril-hydrochlorothiazide  (PRINZIDE,ZESTORETIC) 20-25 MG tablet Take 1 tablet by mouth daily. HOLD UNTIL FOLLOW UP WITH PCP. Patient taking differently: Take 1 tablet by mouth 2 (two) times daily. HOLD UNTIL FOLLOW UP WITH PCP. 11/27/15  Yes Vassie Loll, MD  LORazepam (ATIVAN) 1 MG tablet Take 1 tablet by mouth daily. 03/29/17  Yes [provider]  metFORMIN (GLUCOPHAGE) 500 MG tablet Take 1,000 mg by mouth 2 (two) times daily with a meal.    Yes [provider]  Oxycodone HCl 10 MG TABS take 1 tablet by mouth UP TO four times a day if needed for SEVERE PAIN 12/17/16  Yes [provider]  doxycycline (VIBRAMYCIN) 100 MG capsule Take 1 capsule (100 mg total) by mouth 2 (two) times daily. 11/12/17   Donnetta Hutching, MD    Family History Family History  Problem Relation Age of Onset  . Heart disease Mother   . Heart disease Father   . Healthy Sister   . Hypertension Brother   . Hypertension Brother   . Hypertension Brother   . Diabetes Brother     Social History Social History   Tobacco Use  . Smoking status: Never Smoker  . Smokeless tobacco: Never Used  Substance Use Topics  . Alcohol use: Yes    Comment: occasionally   . Drug use: No     Allergies   Fish allergy   Review of Systems  Review of Systems  All other systems reviewed and are negative.    Physical Exam Updated Vital Signs BP (!) 129/100   Pulse 83   Temp 97.9 F (36.6 C) (Oral)   Resp 16   Ht 5\' 10"  (1.778 m)   Wt 108.9 kg (240 lb)   SpO2 98%   BMI 34.44 kg/m   Physical Exam  Constitutional: He is oriented to person, place, and time. He appears well-developed and well-nourished.  nad  HENT:  Head: Normocephalic and atraumatic.  Eyes: Conjunctivae are normal.  Neck: Neck supple.  Musculoskeletal: Normal range of motion.  Neurological: He is alert and oriented to person, place, and time.  Skin: Skin is warm and dry.  Right great toe: Toe is erythematous but nontender.  6 x 6 cm ulcer on plantar  aspect of the toe.  Minimal drainage.  Psychiatric: He has a normal mood and affect. His behavior is normal.  Nursing note and vitals reviewed.    ED Treatments / Results  Labs (all labs ordered are listed, but only abnormal results are displayed) Labs Reviewed - No data to display  EKG  EKG Interpretation None       Radiology Dg Toe Great Right  Result Date: 11/12/2017 CLINICAL DATA:  Soft tissue ulceration of the right great toe. EXAM: RIGHT GREAT TOE COMPARISON:  None. FINDINGS: There are mild arthritic changes of the first MTP joint. The bones otherwise appear normal. No periosteal reaction or bone destruction to suggest osteomyelitis. IMPRESSION: Mild arthritic changes at the first MTP joint. No visible osteomyelitis. Electronically Signed   By: Francene BoyersJames  Maxwell M.D.   On: 11/12/2017 11:29    Procedures Procedures (including critical care time)  Medications Ordered in ED Medications - No data to display   Initial Impression / Assessment and Plan / ED Course  I have reviewed the triage vital signs and the nursing notes.  Pertinent labs & imaging results that were available during my care of the patient were reviewed by me and considered in my medical decision making (see chart for details).     Patient presents with right great toe redness, plantar ulcer after traumatic injury approximately 1 month ago.  X-ray reveals no osteomyelitis.  Will Rx doxycycline.  Soak in salt water.  Return if symptoms worsen.  This was all discussed with the patient in great detail.  Final Clinical Impressions(s) / ED Diagnoses   Final diagnoses:  Cellulitis of toe of right foot  Skin ulcer of right great toe, limited to breakdown of skin [facility](HCC)    ED Discharge Orders        Ordered    doxycycline (VIBRAMYCIN) 100 MG capsule  2 times daily     11/12/17 1202       Donnetta Hutchingook, Shizuye Rupert, MD 11/12/17 1241

## 2018-02-04 ENCOUNTER — Emergency Department: Admit: 2018-02-04

## 2018-02-04 ENCOUNTER — Inpatient Hospital Stay
Admit: 2018-02-04 | Discharge: 2018-02-04 | Disposition: A | Discharge status: Home / Self Care | Attending: Emergency Medicine

## 2018-02-04 DIAGNOSIS — S0990XA Unspecified injury of head, initial encounter: Secondary | ICD-10-CM

## 2018-02-04 MED ORDER — METHOCARBAMOL 750 MG PO TABS
750 MG | ORAL_TABLET | Freq: Four times a day (QID) | ORAL | 0 refills | Status: AC
Start: 2018-02-04 — End: 2018-02-14

## 2018-02-04 MED ORDER — IBUPROFEN 800 MG PO TABS
800 MG | Freq: Once | ORAL | Status: AC
Start: 2018-02-04 — End: 2018-02-04
  Administered 2018-02-04: 14:00:00 800 mg via ORAL

## 2018-02-04 MED ORDER — NAPROXEN 500 MG PO TABS
500 MG | ORAL_TABLET | Freq: Two times a day (BID) | ORAL | 0 refills | Status: AC
Start: 2018-02-04 — End: ?

## 2018-02-04 MED FILL — IBUPROFEN 800 MG PO TABS: 800 mg | ORAL | Qty: 1

## 2018-02-04 NOTE — ED Provider Notes (Signed)
The patient was seen and examined by me in conjunction with the mid-level provider.  I agree with his/her assessment and treatment plan.    X-ray findings are discussed with the patient and he was instructed to have follow-up with his physician.     Cherie Ouch, MD  02/04/18 1124

## 2018-02-04 NOTE — ED Provider Notes (Signed)
[facility] ED  eMERGENCY dEPARTMENTeNCOUnter      Pt Name: Anthony Nolan  MRN: [id number]  Birthdate Jun 15, 1961  Date ofevaluation: 02/04/2018  Provider: Raoul Pitch, PA-C    CHIEF COMPLAINT       Chief Complaint   Patient presents with   ??? Fall   ??? Back Pain   ??? Head Injury         HISTORY OF PRESENT ILLNESS  (Location/Symptom, Timing/Onset, Context/Setting, Quality, Duration, Modifying Factors, Severity.)   Anthony Nolan is a 57 y.o. male who presents to the emergency department with  fall that occurred 2 days ago.  Patient reports hitting his mid, lower, upper back.  Also hit his head.  Denies LOC.  Vision toxic here today.  Admits to drinking 2 beers this morning.  States he take it to help with the pain.  Pain described as mild, sore, constant, worse with movement and relieved with rest.      Nursing Notes were reviewed.    ALLERGIES     Fish-derived products    CURRENT MEDICATIONS       Discharge Medication List as of 02/04/2018 11:25 AM      CONTINUE these medications which have NOT CHANGED    Details   lisinopril-hydrochlorothiazide (PRINZIDE;ZESTORETIC) 20-12.5 MG per tablet Take 1 tablet by mouth daily Indications: [place] doseHistorical Med      glipiZIDE (GLUCOTROL) 5 MG tablet Take 5 mg by mouth 2 times daily (before meals) Indications: [place]doseHistorical Med      oxyCODONE-acetaminophen (PERCOCET) 5-325 MG per tablet Take 1 tablet by mouth every 4 hours as needed for Pain.Historical Med             PAST MEDICAL HISTORY         Diagnosis Date   ??? Diabetes mellitus (HCC)    ??? Hypertension        SURGICAL HISTORY     History reviewed. No pertinent surgical history.      FAMILY HISTORY     History reviewed. No pertinent family history.  No family status information on file.        SOCIAL HISTORY      reports that he has never smoked. He has never used smokeless tobacco. He reports that he drinks alcohol. He reports that he does not use drugs.    REVIEW OFSYSTEMS    (2-9 systems for level 4, 10 or more  for level 5)   Review of Systems    Except as noted above the remainder of the review of systems was reviewed and negative.     PHYSICAL EXAM    (up to 7 for level 4, 8 or more for level 5)     ED Triage Vitals [02/04/18 0914]   BP Temp Temp Source Pulse Resp SpO2 Height Weight   (!) 153/98 97.5 ??F (36.4 ??C) Oral 92 16 97 %  (1.778 m) 234 lb (106.1 kg)      Physical Exam   Constitutional: He is oriented to person, place, and time. He appears well-developed.   HENT:   Head: Normocephalic and atraumatic.   Eyes: EOM are normal.   Neck: Normal range of motion. Neck supple. Muscular tenderness present.       Cardiovascular: Normal rate and regular rhythm.   Pulmonary/Chest: Effort normal and breath sounds normal.   Abdominal: Soft. He exhibits no distension. There is no tenderness.   Musculoskeletal: Normal range of motion.  Back:    Neurological: He is alert and oriented to person, place, and time. Coordination and gait normal. GCS eye subscore is 4. GCS verbal subscore is 5. GCS motor subscore is 6.   Skin: Skin is warm.   Psychiatric: He has a normal mood and affect. His behavior is normal.               DIAGNOSTIC RESULTS     EKG: All EKG's are interpreted by the Emergency Department Physician who either signs or Co-signs this chart in the absence of a cardiologist.        RADIOLOGY:   Non-plain film images such as CT, Ultrasound and MRI are read by the radiologist. Plain radiographic images arevisualized and preliminarily interpreted by the emergency physician with the below findings:        Interpretation per the Radiologist below, if available at thetime of this note:          ED BEDSIDE ULTRASOUND:   Performed by ED Physician - none    LABS:  Labs Reviewed - No data to display    All other labs were within normal range or not returned as of this dictation.    EMERGENCY DEPARTMENT COURSE and DIFFERENTIAL DIAGNOSIS/MDM:   Vitals:    Vitals:    02/04/18 0914   BP: (!) 153/98   Pulse: 92   Resp: 16    Temp: 97.5 ??F (36.4 ??C)   TempSrc: Oral   SpO2: 97%   Weight: 234 lb (106.1 kg)   Height:  (1.778 m)     Imaging test negative for any acute process.  Loss of height at T7 and T9 was noted on x-rays.  Patient informed he states he will follow-up when he returns to [place].    CONSULTS:  None    PROCEDURES:  Procedures        FINAL IMPRESSION      1. Closed head injury, initial encounter    2. Contusion of back, unspecified laterality, initial encounter          DISPOSITION/PLAN   DISPOSITION Decision To Discharge 02/04/2018 11:24:21 AM      PATIENTREFERRED TO:   No follow-up provider specified.    DISCHARGE MEDICATIONS:     Discharge Medication List as of 02/04/2018 11:25 AM      START taking these medications    Details   naproxen (NAPROSYN) 500 MG tablet Take 1 tablet by mouth 2 times daily (with meals), Disp-20 tablet, R-0Print      methocarbamol (ROBAXIN-750) 750 MG tablet Take 1 tablet by mouth 4 times daily for 10 days, Disp-40 tablet, R-0Print                 (Please note that portions of this note were completed with a voice recognition program.  Efforts were made to edit thedictations but occasionally words are mis-transcribed.)    Raoul Pitch, PA-C            Raoul Pitch, PA-C  02/04/18 1140

## 2018-02-04 NOTE — Discharge Instructions (Signed)
Take meds as prescribed.  Follow outpatient tomorrow with doctor or by calling [phone] or [phone].   Return to ER immediately if symptoms worsen or persist.

## 2018-05-29 ENCOUNTER — Encounter (HOSPITAL_COMMUNITY): Payer: Self-pay | Admitting: Emergency Medicine

## 2018-05-29 ENCOUNTER — Other Ambulatory Visit: Payer: Self-pay

## 2018-05-29 ENCOUNTER — Emergency Department (HOSPITAL_COMMUNITY)
Admission: EM | Admit: 2018-05-29 | Discharge: 2018-05-29 | Discharge status: Against Medical Advice | Payer: Medicaid Other | Attending: Emergency Medicine | Admitting: Emergency Medicine

## 2018-05-29 DIAGNOSIS — R079 Chest pain, unspecified: Secondary | ICD-10-CM

## 2018-05-29 DIAGNOSIS — F1010 Alcohol abuse, uncomplicated: Secondary | ICD-10-CM | POA: Diagnosis not present

## 2018-05-29 DIAGNOSIS — Z5321 Procedure and treatment not carried out due to patient leaving prior to being seen by health care provider: Secondary | ICD-10-CM | POA: Insufficient documentation

## 2018-05-29 NOTE — ED Triage Notes (Signed)
Pt reports needing detox from alcohol. Pt reports last drink was 3-4 hours ago. Pt denies SI/HI.

## 2018-05-29 NOTE — ED Provider Notes (Signed)
Patient placed in Quick Look pathway, seen and evaluated   Chief Complaint: detox  HPI:   Gregory BustleWilliam A Duran is a 57 y.o. male who presents to the ED with request for detox. Patient's family member reports that patient has hx of alcohol abuse and has had GI bleed and [facility]. Patient also had pneumonia at that time. Patient reports chest pain today in the center of his chest that he rates 8/10.   ROS: Psych: no S/I, or H/I  CV: chest pain Physical Exam:  BP (!) 148/92 (BP Location: Right Arm)   Pulse (!) 101   Temp 98.5 F (36.9 C) (Oral)   Resp 16   SpO2 95%    Gen: No distress  Neuro: Awake and Alert  Skin: Warm and dry      Initiation of care has begun. The patient has been counseled on the process, plan, and necessity for staying for the completion/evaluation, and the remainder of the medical screening examination    Janne Napoleoneese, Hope M, NP 05/29/18 Shirlean [id number]    Butler, Michael C, MD 06/10/18 [phone]

## 2018-05-29 NOTE — ED Notes (Signed)
Pt asked to change into purple scrubs. Pt states, "I just want to leave." Pt took off his BP cuff and walked out of Emergency Dept. Pt did not want to stay.

## 2018-06-04 ENCOUNTER — Encounter (HOSPITAL_COMMUNITY): Payer: Self-pay | Admitting: Emergency Medicine

## 2018-06-04 ENCOUNTER — Emergency Department (HOSPITAL_COMMUNITY): Payer: Medicaid Other

## 2018-06-04 ENCOUNTER — Emergency Department (HOSPITAL_COMMUNITY)
Admission: EM | Admit: 2018-06-04 | Discharge: 2018-06-04 | Disposition: A | Discharge status: Home / Self Care | Payer: Medicaid Other | Attending: Emergency Medicine | Admitting: Emergency Medicine

## 2018-06-04 ENCOUNTER — Other Ambulatory Visit: Payer: Self-pay

## 2018-06-04 DIAGNOSIS — Z7984 Long term (current) use of oral hypoglycemic drugs: Secondary | ICD-10-CM | POA: Diagnosis not present

## 2018-06-04 DIAGNOSIS — Z79899 Other long term (current) drug therapy: Secondary | ICD-10-CM | POA: Diagnosis not present

## 2018-06-04 DIAGNOSIS — E119 Type 2 diabetes mellitus without complications: Secondary | ICD-10-CM | POA: Diagnosis not present

## 2018-06-04 DIAGNOSIS — L03317 Cellulitis of buttock: Secondary | ICD-10-CM | POA: Diagnosis not present

## 2018-06-04 DIAGNOSIS — R0789 Other chest pain: Secondary | ICD-10-CM

## 2018-06-04 DIAGNOSIS — I10 Essential (primary) hypertension: Secondary | ICD-10-CM | POA: Diagnosis not present

## 2018-06-04 DIAGNOSIS — R600 Localized edema: Secondary | ICD-10-CM | POA: Diagnosis not present

## 2018-06-04 LAB — BASIC METABOLIC PANEL
Anion gap: 13 (ref 5–15)
BUN: <5 mg/dL — ABNORMAL LOW (ref 6–20)
CO2: 22 mmol/L (ref 22–32)
Calcium: 8.3 mg/dL — ABNORMAL LOW (ref 8.9–10.3)
Chloride: 96 mmol/L — ABNORMAL LOW (ref 98–111)
Creatinine, Ser: 0.63 mg/dL (ref 0.61–1.24)
GFR calc Af Amer: >60 mL/min (ref >60)
GFR calc non Af Amer: >60 mL/min (ref >60)
Glucose, Bld: 377 mg/dL — ABNORMAL HIGH (ref 70–99)
Potassium: 3.6 mmol/L (ref 3.5–5.1)
Sodium: 131 mmol/L — ABNORMAL LOW (ref 135–145)

## 2018-06-04 LAB — CBC
HCT: 41.5 % (ref 39.0–52.0)
Hemoglobin: 14.2 g/dL (ref 13.0–17.0)
MCH: 32.1 pg (ref 26.0–34.0)
MCHC: 34.2 g/dL (ref 30.0–36.0)
MCV: 93.7 fL (ref 78.0–100.0)
Platelets: 207 10*3/uL (ref 150–400)
RBC: 4.43 MIL/uL (ref 4.22–5.81)
RDW: 14 % (ref 11.5–15.5)
WBC: 10.2 10*3/uL (ref 4.0–10.5)

## 2018-06-04 LAB — HEPATIC FUNCTION PANEL
ALT: 67 U/L — ABNORMAL HIGH (ref 0–44)
AST: 82 U/L — ABNORMAL HIGH (ref 15–41)
Albumin: 3.3 g/dL — ABNORMAL LOW (ref 3.5–5.0)
Alkaline Phosphatase: 149 U/L — ABNORMAL HIGH (ref 38–126)
Bilirubin, Direct: 0.6 mg/dL — ABNORMAL HIGH (ref 0.0–0.2)
Indirect Bilirubin: 0.9 mg/dL (ref 0.3–0.9)
Total Bilirubin: 1.5 mg/dL — ABNORMAL HIGH (ref 0.3–1.2)
Total Protein: 7.4 g/dL (ref 6.5–8.1)

## 2018-06-04 LAB — LIPASE, BLOOD: Lipase: 47 U/L (ref 11–51)

## 2018-06-04 LAB — BRAIN NATRIURETIC PEPTIDE: B Natriuretic Peptide: 38 pg/mL (ref 0.0–100.0)

## 2018-06-04 LAB — MAGNESIUM: Magnesium: 1.6 mg/dL — ABNORMAL LOW (ref 1.7–2.4)

## 2018-06-04 LAB — TROPONIN I: Troponin I: <0.03 ng/mL (ref <0.03)

## 2018-06-04 LAB — ETHANOL: Alcohol, Ethyl (B): 114 mg/dL — ABNORMAL HIGH (ref <10)

## 2018-06-04 MED ORDER — SULFAMETHOXAZOLE-TRIMETHOPRIM 800-160 MG PO TABS: 1.0000 | ORAL_TABLET | Freq: Once | ORAL | Status: DC

## 2018-06-04 MED ORDER — SULFAMETHOXAZOLE-TRIMETHOPRIM 800-160 MG PO TABS: 1.0000 | ORAL_TABLET | Freq: Two times a day (BID) | ORAL | 0 refills | Status: AC

## 2018-06-04 NOTE — ED Provider Notes (Signed)
[facility] EMERGENCY DEPARTMENT Provider Note   CSN: [id number] Arrival date & time: 06/04/18  1518     History   Chief Complaint Chief Complaint  Patient presents with  . Chest Pain    HPI Gregory Duran is a 57 y.o. male.  HPI Presents with concern of chest pain. Patient notes multiple medical issues including chronic pain, diabetes, hypertension, and recent admission to [facility] hospital for pneumonia. Seems as though this episode of chest pain began 4 or 5 days ago, since onset is been persistent, diffuse across the anterior thorax. It is unclear if there is any dyspnea, there is worsening fatigue, and generalized discomfort. Some point during this illness the patient also noticed a rash on his right leg, with associated swelling, erythema, pain, described as sharp, severe. Patient notes that are no clear relieving or exacerbating factors for any of the above symptoms. Notably, patient also acknowledges substantial alcohol abuse, states that he would like to receive assistance with this.   Past Medical History:  Diagnosis Date  . Chronic back pain   . Diabetes mellitus (HCC)   . Hepatitis C    Patient states that he does not have hep C.  Will bring test results.  . Hypertension   . Sciatica     Patient Active Problem List   Diagnosis Date Noted  . Gastroesophageal reflux disease with esophagitis   . Acute blood loss anemia   . Controlled type 2 diabetes mellitus with complication, without long-term current use of insulin (HCC)   . Severe dehydration 11/24/2015  . AKI (acute kidney injury) (HCC) 11/24/2015  . Melena 11/24/2015  . Hyponatremia 11/24/2015  . Leukocytosis 11/24/2015  . GI bleed 11/24/2015  . Chronic hepatitis C (HCC) 05/05/2012  . Hypertension 05/05/2012  . Diabetes mellitus (HCC) 05/05/2012  . Chronic low back pain 05/05/2012  . Obesity 05/05/2012    Past Surgical History:  Procedure Laterality Date  . ESOPHAGOGASTRODUODENOSCOPY (EGD) WITH  PROPOFOL N/A 11/25/2015   Procedure: ESOPHAGOGASTRODUODENOSCOPY (EGD) WITH PROPOFOL;  Surgeon: Malissa Hippo, MD;  Location: [facility];  Service: Endoscopy;  Laterality: N/A;  . KIDNEY STONE SURGERY          Home Medications    Prior to Admission medications   Medication Sig Start Date End Date Taking? Authorizing Provider  gabapentin (NEURONTIN) 100 MG capsule Take 200 mg by mouth 3 (three) times daily. 05/24/18 06/23/18  [provider]  glipiZIDE (GLUCOTROL) 10 MG tablet Take 10 mg by mouth 2 (two) times daily.     [provider]  lisinopril-hydrochlorothiazide (PRINZIDE,ZESTORETIC) 20-25 MG tablet Take 1 tablet by mouth daily. HOLD UNTIL FOLLOW UP WITH PCP. 11/27/15   Vassie Loll, MD  LORazepam (ATIVAN) 1 MG tablet Take 1 tablet by mouth daily. 03/29/17   [provider]  magnesium oxide (MAG-OX) 400 MG tablet Take 400 mg by mouth daily.  05/24/18 05/24/19  [provider]  metFORMIN (GLUCOPHAGE) 500 MG tablet Take 1,000 mg by mouth 2 (two) times daily with a meal.     [provider]  Oxycodone HCl 10 MG TABS Take 10 mg by mouth 4 (four) times daily as needed (up to 4 times daily if needed for severe pain).  12/17/16   [provider]  pantoprazole (PROTONIX) 40 MG tablet Take 40 mg by mouth daily. 05/24/18 06/23/18  [provider]  sucralfate (CARAFATE) 1 GM/10ML suspension Take 10 mLs by mouth every 6 (six) hours. 05/24/18 06/23/18  [provider]  thiamine 100 MG tablet Take 100 mg by mouth daily. 05/24/18 05/24/19  [provider]    Family History Family History  Problem Relation Age of Onset  . Heart disease Mother   . Heart disease Father   . Healthy Sister   . Hypertension Brother   . Hypertension Brother   . Hypertension Brother   . Diabetes Brother     Social History Social History   Tobacco Use  . Smoking status: Never Smoker  . Smokeless tobacco: Never Used  Substance Use Topics    . Alcohol use: Yes    Comment: heavily; 2 beers today per pt  . Drug use: No     Allergies   Fish allergy   Review of Systems Review of Systems  Gastrointestinal: Negative for abdominal pain.  Skin: Positive for rash.  Psychiatric/Behavioral: The patient is nervous/anxious.        Alcohol abuse     Physical Exam Updated Vital Signs BP (!) 142/91   Pulse 99   Temp 99.6 F (37.6 C) (Temporal)   Resp (!) 22   Ht 5\' 10"  (1.778 m)   Wt 104.3 kg   SpO2 97%   BMI 33.00 kg/m   Physical Exam  Constitutional: He is oriented to person, place, and time. He appears well-developed. He has a sickly appearance. No distress.  HENT:  Head: Normocephalic and atraumatic.  Eyes: Conjunctivae and EOM are normal.  Cardiovascular: Normal rate and regular rhythm.  Pulmonary/Chest: Effort normal. No stridor. No respiratory distress.  Abdominal: He exhibits no distension.  Musculoskeletal: He exhibits edema. He exhibits no deformity.  Neurological: He is alert and oriented to person, place, and time.  Skin: Skin is warm and dry.     Psychiatric: His mood appears anxious.  Nursing note and vitals reviewed.    ED Treatments / Results  Labs (all labs ordered are listed, but only abnormal results are displayed) Labs Reviewed  BASIC METABOLIC PANEL - Abnormal; Notable for the following components:      Result Value   Sodium 131 (*)    Chloride 96 (*)    Glucose, Bld 377 (*)    BUN <5 (*)    Calcium 8.3 (*)    All other components within normal limits  ETHANOL - Abnormal; Notable for the following components:   Alcohol, Ethyl (B) 114 (*)    All other components within normal limits  MAGNESIUM - Abnormal; Notable for the following components:   Magnesium 1.6 (*)    All other components within normal limits  HEPATIC FUNCTION PANEL - Abnormal; Notable for the following components:   Albumin 3.3 (*)    AST 82 (*)    ALT 67 (*)    Alkaline Phosphatase 149 (*)    Total Bilirubin  1.5 (*)    Bilirubin, Direct 0.6 (*)    All other components within normal limits  CBC  TROPONIN I  BRAIN NATRIURETIC PEPTIDE  LIPASE, BLOOD    EKG EKG Interpretation  Date/Time:  Wednesday June 04 2018 15:24:06 EDT Ventricular Rate:  108 PR Interval:  196 QRS Duration: 102 QT Interval:  348 QTC Calculation: 466 R Axis:   -55 Text Interpretation:  Sinus tachycardia with frequent Premature ventricular complexes Left anterior fascicular block ST-t wave abnormality Abnormal ekg Confirmed by Gerhard Munch [phone]) on 06/04/2018 3:36:37 PM   Radiology Dg Chest 2 View  Result Date: 06/04/2018 CLINICAL DATA:  Chest pain for more than 10 days. EXAM: CHEST -  2 VIEW COMPARISON:  PA and lateral chest 02/26/2012. FINDINGS: Lungs clear. Heart size normal. No pneumothorax or pleural effusion. No acute or focal bony abnormality. IMPRESSION: Negative chest. Electronically Signed   By: Drusilla Kanner M.D.   On: 06/04/2018 16:10   US Venous Img Lower Right (dvt Study)  Result Date: 06/04/2018 CLINICAL DATA:  57 year old male with a history of right leg redness and edema EXAM: RIGHT LOWER EXTREMITY VENOUS DOPPLER ULTRASOUND TECHNIQUE: Gray-scale sonography with graded compression, as well as color Doppler and duplex ultrasound were performed to evaluate the lower extremity deep venous systems from the level of the common femoral vein and including the common femoral, femoral, profunda femoral, popliteal and calf veins including the posterior tibial, peroneal and gastrocnemius veins when visible. The superficial great saphenous vein was also interrogated. Spectral Doppler was utilized to evaluate flow at rest and with distal augmentation maneuvers in the common femoral, femoral and popliteal veins. COMPARISON:  None. FINDINGS: Contralateral Common Femoral Vein: Respiratory phasicity is normal and symmetric with the symptomatic side. No evidence of thrombus. Normal compressibility. Common Femoral Vein:  No evidence of thrombus. Normal compressibility, respiratory phasicity and response to augmentation. Saphenofemoral Junction: No evidence of thrombus. Normal compressibility and flow on color Doppler imaging. Profunda Femoral Vein: No evidence of thrombus. Normal compressibility and flow on color Doppler imaging. Femoral Vein: No evidence of thrombus. Normal compressibility, respiratory phasicity and response to augmentation. Popliteal Vein: No evidence of thrombus. Normal compressibility, respiratory phasicity and response to augmentation. Calf Veins: No evidence of thrombus. Normal compressibility and flow on color Doppler imaging. Superficial Great Saphenous Vein: No evidence of thrombus. Normal compressibility and flow on color Doppler imaging. Other Findings:  Edema of the right lower extremity IMPRESSION: Sonographic survey of the right lower extremity negative for DVT. Edema of the right lower extremity Electronically Signed   By: Gilmer Mor D.O.   On: 06/04/2018 17:17    Procedures Procedures (including critical care time)  Medications Ordered in ED Medications  sulfamethoxazole-trimethoprim (BACTRIM DS,SEPTRA DS) 800-160 MG per tablet 1 tablet (has no administration in time range)     Initial Impression / Assessment and Plan / ED Course  I have reviewed the triage vital signs and the nursing notes.  Pertinent labs & imaging results that were available during my care of the patient were reviewed by me and considered in my medical decision making (see chart for details).     6:40 PM On repeat exam patient is sitting at the edge of the bed, stating that he feels ready to leave. Patient's evaluation is generally reassuring, no evidence for recurrent pneumonia, no fever, no evidence for ACS, and given his duration of pain, there is low suspicion for atypical ACS. I discussed findings thus far with the patient, including reassuring ultrasound, no evidence for DVT. Patient does have  erythema concerning for cellulitis, and will start Bactrim. Patient was amenable to this, but not staying for additional evaluation, management.   Final Clinical Impressions(s) / ED Diagnoses  Atypical chest pain Cellulitis   Gerhard Munch, MD 06/04/18 [phone]

## 2018-06-04 NOTE — ED Notes (Signed)
Upon D/C pt no longer in the room. Pt left without getting discharge instructions.

## 2018-06-04 NOTE — ED Triage Notes (Signed)
Pt reports CP for over 10 days, was dx with with pneumonia. States he has finished all antibx. Also c/o R leg pain and redness to later knee. BLE swelling.

## 2018-06-04 NOTE — Discharge Instructions (Signed)
As discussed, your evaluation today has been largely reassuring.  But, it is important that you monitor your condition carefully, and do not hesitate to return to the ED if you develop new, or concerning changes in your condition. ? ?Otherwise, please follow-up with your physician for appropriate ongoing care. ? ?

## 2018-06-27 ENCOUNTER — Emergency Department (HOSPITAL_COMMUNITY): Payer: Medicaid Other

## 2018-06-27 ENCOUNTER — Emergency Department (HOSPITAL_COMMUNITY)
Admission: EM | Admit: 2018-06-27 | Discharge: 2018-06-27 | Disposition: A | Discharge status: Home / Self Care | Payer: Medicaid Other | Attending: Emergency Medicine | Admitting: Emergency Medicine

## 2018-06-27 ENCOUNTER — Other Ambulatory Visit: Payer: Self-pay

## 2018-06-27 ENCOUNTER — Encounter (HOSPITAL_COMMUNITY): Payer: Self-pay | Admitting: Emergency Medicine

## 2018-06-27 DIAGNOSIS — E119 Type 2 diabetes mellitus without complications: Secondary | ICD-10-CM | POA: Diagnosis not present

## 2018-06-27 DIAGNOSIS — Z79899 Other long term (current) drug therapy: Secondary | ICD-10-CM | POA: Diagnosis not present

## 2018-06-27 DIAGNOSIS — I10 Essential (primary) hypertension: Secondary | ICD-10-CM | POA: Insufficient documentation

## 2018-06-27 DIAGNOSIS — M25512 Pain in left shoulder: Secondary | ICD-10-CM | POA: Insufficient documentation

## 2018-06-27 DIAGNOSIS — Z7984 Long term (current) use of oral hypoglycemic drugs: Secondary | ICD-10-CM | POA: Diagnosis not present

## 2018-06-27 MED ORDER — CYCLOBENZAPRINE HCL 10 MG PO TABS: 10.0000 mg | ORAL_TABLET | Freq: Two times a day (BID) | ORAL | 0 refills | Status: DC | PRN

## 2018-06-27 MED ORDER — PREDNISONE 10 MG PO TABS: 10.0000 mg | ORAL_TABLET | Freq: Every day | ORAL | 0 refills | Status: DC

## 2018-06-27 MED ORDER — OXYCODONE-ACETAMINOPHEN 5-325 MG PO TABS: 1.0000 | ORAL_TABLET | ORAL | 0 refills | Status: DC | PRN

## 2018-06-27 MED ORDER — HYDROMORPHONE HCL 1 MG/ML IJ SOLN
1.0000 mg | Freq: Once | INTRAMUSCULAR | Status: AC
  Administered 2018-06-27: 1 mg via INTRAMUSCULAR
  Filled 2018-06-27: qty 1

## 2018-06-27 MED ORDER — DICLOFENAC SODIUM 50 MG PO TBEC: 50.0000 mg | DELAYED_RELEASE_TABLET | Freq: Two times a day (BID) | ORAL | 0 refills | Status: DC

## 2018-06-27 NOTE — ED Triage Notes (Signed)
Shoulder pain to LT shoulder since beginning of September
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
## 2018-06-27 NOTE — Care Management Note (Signed)
Case Management Note  Patient Details  Name: Gregory Duran MRN: [id number] Date of Birth: 27-Jan-1961  Subjective/Objective:  CM consulted for medication assistance. Patient reports he is out of work due to shoulder pain/issues. He has a PCP-Dr. Sudie Bailey but can't afford to see him currently. We discuss Medicaid and other community resources.             Action/Plan: MATCH provided for Medications. Patient uses [employer]. Discussed Care Connects and what they do. Added Care Care Connect  Information to AVS for patient.  Patient also being referred to an orthopedic surgeon.   Expected Discharge Date:       06/27/2018           Expected Discharge Plan:  Home/Self Care  In-House Referral:     Discharge planning Services  CM Consult, MATCH Program, Indigent Health Clinic  Post Acute Care Choice:    Choice offered to:  Patient  DME Arranged:    DME Agency:     HH Arranged:    HH Agency:     Status of Service:  Completed, signed off  If discussed at [employer], dates discussed:    Additional Comments:  Tanner Vigna, Chrystine Oiler, RN 06/27/2018, 3:21 PM

## 2018-06-27 NOTE — Clinical Social Work Note (Signed)
CSW consult received for pt unable to afford his medications. Updated RN CM who will follow up on this concern.

## 2018-06-27 NOTE — Discharge Instructions (Signed)
X-rays show no fracture.  He will need orthopedic follow-up.  Ice pack.  Prescription for muscle relaxer, prednisone, anti-inflammatory, pain.

## 2018-07-02 ENCOUNTER — Emergency Department (HOSPITAL_COMMUNITY)
Admission: EM | Admit: 2018-07-02 | Discharge: 2018-07-02 | Disposition: A | Discharge status: Home / Self Care | Payer: Medicaid Other | Attending: Emergency Medicine | Admitting: Emergency Medicine

## 2018-07-02 ENCOUNTER — Encounter (HOSPITAL_COMMUNITY): Payer: Self-pay | Admitting: Emergency Medicine

## 2018-07-02 ENCOUNTER — Other Ambulatory Visit: Payer: Self-pay

## 2018-07-02 DIAGNOSIS — M25512 Pain in left shoulder: Secondary | ICD-10-CM | POA: Diagnosis present

## 2018-07-02 DIAGNOSIS — G8929 Other chronic pain: Secondary | ICD-10-CM | POA: Insufficient documentation

## 2018-07-02 DIAGNOSIS — I10 Essential (primary) hypertension: Secondary | ICD-10-CM | POA: Insufficient documentation

## 2018-07-02 DIAGNOSIS — E119 Type 2 diabetes mellitus without complications: Secondary | ICD-10-CM | POA: Diagnosis not present

## 2018-07-02 LAB — CBG MONITORING, ED: Glucose-Capillary: 393 mg/dL — ABNORMAL HIGH (ref 70–99)

## 2018-07-02 MED ORDER — KETOROLAC TROMETHAMINE 60 MG/2ML IM SOLN
30.0000 mg | Freq: Once | INTRAMUSCULAR | Status: AC
  Administered 2018-07-02: 30 mg via INTRAMUSCULAR
  Filled 2018-07-02: qty 2

## 2018-07-02 MED ORDER — OXYCODONE HCL 5 MG PO TABS
10.0000 mg | ORAL_TABLET | Freq: Once | ORAL | Status: AC
  Administered 2018-07-02: 10 mg via ORAL
  Filled 2018-07-02: qty 2

## 2018-07-02 MED ORDER — OXYCODONE HCL 5 MG PO TABS: 5.0000 mg | ORAL_TABLET | ORAL | 0 refills | Status: DC | PRN

## 2018-07-02 NOTE — Discharge Instructions (Addendum)
You were evaluated in the Emergency Department and after careful evaluation, we did not find any emergent condition requiring admission or further testing in the hospital.  Your symptoms today seem to be due to a rotator cuff injury.  It is important that you follow-up with an orthopedic specialist.  Please use the resources provided.  Please return to the Emergency Department if you experience any worsening of your condition.  We encourage you to follow up with a primary care provider.  Thank you for allowing [place] to be a part of your care.

## 2018-07-02 NOTE — Care Management (Addendum)
CM consulted for needs. CM seen patient on 06/27/2018. MATCH provided. Extensive discussion about Care Connects and the help they can provide was given. Care Connect information was added to patient's AVS for him to follow up. He has not.  No other assistance available for patient at this time.  Will add Care Connect info to AVS again.
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
## 2018-07-02 NOTE — ED Notes (Signed)
Patient CBG 393 at triage, patient eating a chocolate and peanut butter bar in triage.

## 2018-07-08 ENCOUNTER — Other Ambulatory Visit: Payer: Self-pay

## 2018-07-08 ENCOUNTER — Encounter (HOSPITAL_COMMUNITY): Payer: Self-pay | Admitting: Emergency Medicine

## 2018-07-08 ENCOUNTER — Emergency Department (HOSPITAL_COMMUNITY)
Admission: EM | Admit: 2018-07-08 | Discharge: 2018-07-08 | Disposition: A | Discharge status: Home / Self Care | Payer: Medicaid Other | Attending: Emergency Medicine | Admitting: Emergency Medicine

## 2018-07-08 DIAGNOSIS — Z7984 Long term (current) use of oral hypoglycemic drugs: Secondary | ICD-10-CM | POA: Diagnosis not present

## 2018-07-08 DIAGNOSIS — Y999 Unspecified external cause status: Secondary | ICD-10-CM | POA: Insufficient documentation

## 2018-07-08 DIAGNOSIS — I10 Essential (primary) hypertension: Secondary | ICD-10-CM | POA: Diagnosis not present

## 2018-07-08 DIAGNOSIS — Z79899 Other long term (current) drug therapy: Secondary | ICD-10-CM | POA: Diagnosis not present

## 2018-07-08 DIAGNOSIS — E119 Type 2 diabetes mellitus without complications: Secondary | ICD-10-CM | POA: Insufficient documentation

## 2018-07-08 DIAGNOSIS — S4992XA Unspecified injury of left shoulder and upper arm, initial encounter: Secondary | ICD-10-CM | POA: Diagnosis present

## 2018-07-08 DIAGNOSIS — S46912A Strain of unspecified muscle, fascia and tendon at shoulder and upper arm level, left arm, initial encounter: Secondary | ICD-10-CM | POA: Diagnosis not present

## 2018-07-08 DIAGNOSIS — Y929 Unspecified place or not applicable: Secondary | ICD-10-CM | POA: Insufficient documentation

## 2018-07-08 DIAGNOSIS — Y939 Activity, unspecified: Secondary | ICD-10-CM | POA: Diagnosis not present

## 2018-07-08 HISTORY — DX: Pain in unspecified shoulder: M25.519

## 2018-07-08 HISTORY — DX: Other chronic pain: G89.29

## 2018-07-08 MED ORDER — IBUPROFEN 600 MG PO TABS: 600.0000 mg | ORAL_TABLET | Freq: Four times a day (QID) | ORAL | 0 refills | Status: DC | PRN

## 2018-07-08 MED ORDER — KETOROLAC TROMETHAMINE 30 MG/ML IJ SOLN
30.0000 mg | Freq: Once | INTRAMUSCULAR | Status: AC
  Administered 2018-07-08: 30 mg via INTRAMUSCULAR
  Filled 2018-07-08: qty 1

## 2018-07-08 MED ORDER — OXYCODONE-ACETAMINOPHEN 5-325 MG PO TABS
1.0000 | ORAL_TABLET | Freq: Once | ORAL | Status: AC
  Administered 2018-07-08: 1 via ORAL
  Filled 2018-07-08: qty 1

## 2018-07-08 MED ORDER — DEXAMETHASONE SODIUM PHOSPHATE 10 MG/ML IJ SOLN
10.0000 mg | Freq: Once | INTRAMUSCULAR | Status: AC
  Administered 2018-07-08: 10 mg via INTRAMUSCULAR
  Filled 2018-07-08: qty 1
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
## 2018-07-08 NOTE — ED Provider Notes (Signed)
[facility] EMERGENCY DEPARTMENT Provider Note   CSN: [id number] Arrival date & time: 07/08/18  1509     History   Chief Complaint Chief Complaint  Patient presents with  . Shoulder Pain    HPI Gregory Duran is a 57 y.o. male.  Pt presents to the ED today with left shoulder pain.  Pain has been going on for several weeks since he was arrested by the police.  The pt said that since the arrest, he has not been able to move his shoulder.  He has had xrays which were negative.  Because he was in jail, he has lost his job and insurance.  He has been unable to afford to see an orthopedist. He has applied to medicaid.  He wants an MRI of his left shoulder in the ED.  The pt was given percocet rx on 9/27 and on 10/2.  Xrays from 9/27 reviewed and are unremarkable.     Past Medical History:  Diagnosis Date  . Chronic back pain   . Chronic shoulder pain   . Diabetes mellitus (HCC)   . Hepatitis C    Patient states that he does not have hep C.  Will bring test results.  . Hypertension   . Sciatica     Patient Active Problem List   Diagnosis Date Noted  . Gastroesophageal reflux disease with esophagitis   . Acute blood loss anemia   . Controlled type 2 diabetes mellitus with complication, without long-term current use of insulin (HCC)   . Severe dehydration 11/24/2015  . AKI (acute kidney injury) (HCC) 11/24/2015  . Melena 11/24/2015  . Hyponatremia 11/24/2015  . Leukocytosis 11/24/2015  . GI bleed 11/24/2015  . Chronic hepatitis C (HCC) 05/05/2012  . Hypertension 05/05/2012  . Diabetes mellitus (HCC) 05/05/2012  . Chronic low back pain 05/05/2012  . Obesity 05/05/2012    Past Surgical History:  Procedure Laterality Date  . ESOPHAGOGASTRODUODENOSCOPY (EGD) WITH PROPOFOL N/A 11/25/2015   Procedure: ESOPHAGOGASTRODUODENOSCOPY (EGD) WITH PROPOFOL;  Surgeon: Malissa Hippo, MD;  Location: [facility];  Service: Endoscopy;  Laterality: N/A;  . KIDNEY STONE SURGERY           Home Medications    Prior to Admission medications   Medication Sig Start Date End Date Taking? Authorizing Provider  cyclobenzaprine (FLEXERIL) 10 MG tablet Take 1 tablet (10 mg total) by mouth 2 (two) times daily as needed for muscle spasms. 06/27/18   Donnetta Hutching, MD  diclofenac (VOLTAREN) 50 MG EC tablet Take 1 tablet (50 mg total) by mouth 2 (two) times daily. 06/27/18   Donnetta Hutching, MD  gabapentin (NEURONTIN) 100 MG capsule Take 200 mg by mouth 3 (three) times daily. 05/24/18 06/23/18  [provider]  glipiZIDE (GLUCOTROL) 10 MG tablet Take 10 mg by mouth 2 (two) times daily.     [provider]  ibuprofen (ADVIL,MOTRIN) 600 MG tablet Take 1 tablet (600 mg total) by mouth every 6 (six) hours as needed. 07/08/18   Jacalyn Lefevre, MD  lisinopril-hydrochlorothiazide (PRINZIDE,ZESTORETIC) 20-25 MG tablet Take 1 tablet by mouth daily. HOLD UNTIL FOLLOW UP WITH PCP. 11/27/15   Vassie Loll, MD  LORazepam (ATIVAN) 1 MG tablet Take 1 tablet by mouth daily. 03/29/17   [provider]  metFORMIN (GLUCOPHAGE) 500 MG tablet Take 1,000 mg by mouth 2 (two) times daily with a meal.     [provider]  oxyCODONE (ROXICODONE) 5 MG immediate release tablet Take 1 tablet (5 mg  total) by mouth every 4 (four) hours as needed for severe pain. 07/02/18   Sabas Sous, MD  Oxycodone HCl 10 MG TABS Take 10 mg by mouth 5 (five) times daily as needed.  12/17/16   [provider]  oxyCODONE-acetaminophen (PERCOCET/ROXICET) 5-325 MG tablet Take 1 tablet by mouth every 4 (four) hours as needed for severe pain. 06/27/18   Donnetta Hutching, MD  predniSONE (DELTASONE) 10 MG tablet Take 1 tablet (10 mg total) by mouth daily with breakfast. 2 tablets for 5 days, 1 tablet for 5 days 06/27/18   Donnetta Hutching, MD    Family History Family History  Problem Relation Age of Onset  . Heart disease Mother   . Heart disease Father   . Healthy Sister   . Hypertension Brother   .  Hypertension Brother   . Hypertension Brother   . Diabetes Brother     Social History Social History   Tobacco Use  . Smoking status: Never Smoker  . Smokeless tobacco: Never Used  Substance Use Topics  . Alcohol use: Yes    Comment: heavily; 2 beers today per pt  . Drug use: No     Allergies   Fish allergy   Review of Systems Review of Systems  Musculoskeletal:       Left shoulder pain  All other systems reviewed and are negative.    Physical Exam Updated Vital Signs There were no vitals taken for this visit.  Physical Exam  Constitutional: He is oriented to person, place, and time. He appears well-developed and well-nourished.  HENT:  Head: Normocephalic and atraumatic.  Right Ear: External ear normal.  Left Ear: External ear normal.  Nose: Nose normal.  Mouth/Throat: Oropharynx is clear and moist.  Eyes: Pupils are equal, round, and reactive to light. Conjunctivae and EOM are normal.  Neck: Normal range of motion. Neck supple.  Cardiovascular: Normal rate, regular rhythm, normal heart sounds and intact distal pulses.  Pulmonary/Chest: Effort normal and breath sounds normal.  Abdominal: Soft. Bowel sounds are normal.  Musculoskeletal:       Left shoulder: He exhibits decreased range of motion and tenderness.  Neurological: He is alert and oriented to person, place, and time.  Skin: Skin is warm. Capillary refill takes less than 2 seconds.  Psychiatric: He has a normal mood and affect. His behavior is normal. Judgment and thought content normal.  Nursing note and vitals reviewed.    ED Treatments / Results  Labs (all labs ordered are listed, but only abnormal results are displayed) Labs Reviewed - No data to display  EKG None  Radiology No results found.  Procedures Procedures (including critical care time)  Medications Ordered in ED Medications  dexamethasone (DECADRON) injection 10 mg (10 mg Intramuscular Given 07/08/18 1636)  ketorolac  (TORADOL) 30 MG/ML injection 30 mg (30 mg Intramuscular Given 07/08/18 1636)  oxyCODONE-acetaminophen (PERCOCET/ROXICET) 5-325 MG per tablet 1 tablet (1 tablet Oral Given 07/08/18 1636)     Initial Impression / Assessment and Plan / ED Course  I have reviewed the triage vital signs and the nursing notes.  Pertinent labs & imaging results that were available during my care of the patient were reviewed by me and considered in my medical decision making (see chart for details).     Pt wants more narcotics, but he was told that the ED is not an appropriate place for treatment of chronic pain or continuous narcotic prescriptions.  He is not happy with this.  He  is told that he needs to see ortho.  Final Clinical Impressions(s) / ED Diagnoses   Final diagnoses:  Strain of left shoulder, initial encounter    ED Discharge Orders         Ordered    ibuprofen (ADVIL,MOTRIN) 600 MG tablet  Every 6 hours PRN     07/08/18 1708           Jacalyn Lefevre, MD 07/08/18 1713

## 2018-10-30 ENCOUNTER — Other Ambulatory Visit: Payer: Self-pay | Admitting: Orthopedic Surgery

## 2018-10-30 DIAGNOSIS — M5412 Radiculopathy, cervical region: Secondary | ICD-10-CM

## 2018-11-11 ENCOUNTER — Ambulatory Visit: Admission: RE | Admit: 2018-11-11 | Payer: Medicaid Other | Source: Ambulatory Visit

## 2018-11-12 ENCOUNTER — Ambulatory Visit
Admission: RE | Admit: 2018-11-12 | Discharge: 2018-11-12 | Disposition: A | Discharge status: Home / Self Care | Payer: Medicaid Other | Source: Ambulatory Visit | Attending: Orthopedic Surgery | Admitting: Orthopedic Surgery

## 2018-11-12 DIAGNOSIS — M5412 Radiculopathy, cervical region: Secondary | ICD-10-CM

## 2018-11-17 ENCOUNTER — Encounter: Payer: Self-pay | Admitting: Emergency Medicine

## 2018-11-17 ENCOUNTER — Other Ambulatory Visit: Payer: Self-pay

## 2018-11-17 ENCOUNTER — Emergency Department
Admission: EM | Admit: 2018-11-17 | Discharge: 2018-11-17 | Disposition: A | Discharge status: Home / Self Care | Payer: Medicaid Other | Attending: Emergency Medicine | Admitting: Emergency Medicine

## 2018-11-17 ENCOUNTER — Emergency Department: Payer: Medicaid Other

## 2018-11-17 DIAGNOSIS — I10 Essential (primary) hypertension: Secondary | ICD-10-CM | POA: Insufficient documentation

## 2018-11-17 DIAGNOSIS — J209 Acute bronchitis, unspecified: Secondary | ICD-10-CM

## 2018-11-17 DIAGNOSIS — E119 Type 2 diabetes mellitus without complications: Secondary | ICD-10-CM | POA: Insufficient documentation

## 2018-11-17 DIAGNOSIS — Z7984 Long term (current) use of oral hypoglycemic drugs: Secondary | ICD-10-CM | POA: Insufficient documentation

## 2018-11-17 DIAGNOSIS — R0789 Other chest pain: Secondary | ICD-10-CM | POA: Diagnosis present

## 2018-11-17 LAB — CBC
HCT: 39.5 % (ref 39.0–52.0)
Hemoglobin: 13.5 g/dL (ref 13.0–17.0)
MCH: 31 pg (ref 26.0–34.0)
MCHC: 34.2 g/dL (ref 30.0–36.0)
MCV: 90.6 fL (ref 80.0–100.0)
Platelets: 137 10*3/uL — ABNORMAL LOW (ref 150–400)
RBC: 4.36 MIL/uL (ref 4.22–5.81)
RDW: 12.3 % (ref 11.5–15.5)
WBC: 5.4 10*3/uL (ref 4.0–10.5)
nRBC: 0 % (ref 0.0–0.2)

## 2018-11-17 LAB — BASIC METABOLIC PANEL
Anion gap: 8 (ref 5–15)
BUN: 13 mg/dL (ref 6–20)
CO2: 25 mmol/L (ref 22–32)
Calcium: 8.9 mg/dL (ref 8.9–10.3)
Chloride: 100 mmol/L (ref 98–111)
Creatinine, Ser: 0.76 mg/dL (ref 0.61–1.24)
GFR calc Af Amer: >60 mL/min (ref >60)
GFR calc non Af Amer: >60 mL/min (ref >60)
Glucose, Bld: 195 mg/dL — ABNORMAL HIGH (ref 70–99)
Potassium: 3.9 mmol/L (ref 3.5–5.1)
Sodium: 133 mmol/L — ABNORMAL LOW (ref 135–145)

## 2018-11-17 LAB — TROPONIN I: Troponin I: <0.03 ng/mL (ref <0.03)

## 2018-11-17 MED ORDER — AZITHROMYCIN 250 MG PO TABS: ORAL_TABLET | ORAL | 0 refills | Status: AC

## 2018-11-17 MED ORDER — HYDROCOD POLST-CPM POLST ER 10-8 MG/5ML PO SUER
5.0000 mL | Freq: Once | ORAL | Status: AC
  Administered 2018-11-17: 5 mL via ORAL
  Filled 2018-11-17: qty 5

## 2018-11-17 MED ORDER — SODIUM CHLORIDE 0.9% FLUSH: 3.0000 mL | Freq: Once | INTRAVENOUS | Status: DC

## 2018-11-17 MED ORDER — GUAIFENESIN-CODEINE 100-10 MG/5ML PO SOLN: 5.0000 mL | Freq: Four times a day (QID) | ORAL | 0 refills | Status: DC | PRN

## 2018-11-17 NOTE — ED Provider Notes (Signed)
[facility] Emergency Department Provider Note  Time seen: 11:21 AM  I have reviewed the triage vital signs and the nursing notes.   HISTORY  Chief Complaint Chest Pain    HPI Gregory Duran is a 58 y.o. male with a past medical history of diabetes, hypertension, hepatitis, presents to the emergency department for cough congestion subjective fever.  According to the patient for the past 1 week he has had a dry hacking cough along with mild congestion at times and subjective fever although has not measured a temperature.  Patient states he has had bronchitis multiple times in the past which this feels similar.  Patient states for the last 2 to 3 days he has had a mild pressure or tightness sensation in the chest as well although denies any currently.  States it mostly occurs with the cough.  No leg pain or swelling.  Upon arrival patient appears very well, no distress.  Vitals are within normal limits.   Past Medical History:  Diagnosis Date  . Chronic back pain   . Chronic shoulder pain   . Diabetes mellitus (HCC)   . Hepatitis C    Patient states that he does not have hep C.  Will bring test results.  . Hypertension   . Sciatica     Patient Active Problem List   Diagnosis Date Noted  . Gastroesophageal reflux disease with esophagitis   . Acute blood loss anemia   . Controlled type 2 diabetes mellitus with complication, without long-term current use of insulin (HCC)   . Severe dehydration 11/24/2015  . AKI (acute kidney injury) (HCC) 11/24/2015  . Melena 11/24/2015  . Hyponatremia 11/24/2015  . Leukocytosis 11/24/2015  . GI bleed 11/24/2015  . Chronic hepatitis C (HCC) 05/05/2012  . Hypertension 05/05/2012  . Diabetes mellitus (HCC) 05/05/2012  . Chronic low back pain 05/05/2012  . Obesity 05/05/2012    Past Surgical History:  Procedure Laterality Date  . ESOPHAGOGASTRODUODENOSCOPY (EGD) WITH PROPOFOL N/A 11/25/2015   Procedure:  ESOPHAGOGASTRODUODENOSCOPY (EGD) WITH PROPOFOL;  Surgeon: Malissa Hippo, MD;  Location: [facility];  Service: Endoscopy;  Laterality: N/A;  . KIDNEY STONE SURGERY      Prior to Admission medications   Medication Sig Start Date End Date Taking? Authorizing Provider  ALPRAZolam Prudy Feeler) 1 MG tablet Take 1 mg by mouth at bedtime.   Yes [provider]  glipiZIDE (GLUCOTROL) 10 MG tablet Take 10 mg by mouth 2 (two) times daily.    Yes [provider]  lisinopril-hydrochlorothiazide (PRINZIDE,ZESTORETIC) 20-25 MG tablet Take 1 tablet by mouth daily. HOLD UNTIL FOLLOW UP WITH PCP. 11/27/15  Yes Vassie Loll, MD  metFORMIN (GLUCOPHAGE) 500 MG tablet Take 1,000 mg by mouth 2 (two) times daily with a meal.    Yes [provider]  oxyCODONE (ROXICODONE) 5 MG immediate release tablet Take 1 tablet (5 mg total) by mouth every 4 (four) hours as needed for severe pain. 07/02/18  Yes Sabas Sous, MD    Allergies  Allergen Reactions  . Fish Allergy Nausea And Vomiting    Family History  Problem Relation Age of Onset  . Heart disease Mother   . Heart disease Father   . Healthy Sister   . Hypertension Brother   . Hypertension Brother   . Hypertension Brother   . Diabetes Brother     Social History Social History   Tobacco Use  . Smoking status: Never Smoker  . Smokeless tobacco: Never  Used  Substance Use Topics  . Alcohol use: Yes    Comment: heavily; 2 beers today per pt  . Drug use: No    Review of Systems Constitutional: Subjective fever, not measured. ENT: Mild congestion x1 week Cardiovascular: Mild chest tightness/pressure.  Currently resolved. Respiratory: Negative for shortness of breath.  Positive for cough. Gastrointestinal: Negative for abdominal pain, vomiting  Genitourinary: Negative for urinary compaints Musculoskeletal: Negative for leg pain or swelling Skin: Negative for skin complaints  Neurological: Negative for headache All other  ROS negative  ____________________________________________   PHYSICAL EXAM:  VITAL SIGNS: ED Triage Vitals  Enc Vitals Group     BP 11/17/18 0918 121/73     Pulse Rate 11/17/18 0918 90     Resp 11/17/18 0918 18     Temp 11/17/18 0918 98.2 F (36.8 C)     Temp Source 11/17/18 0918 Oral     SpO2 11/17/18 0918 99 %     Weight 11/17/18 0916 210 lb (95.3 kg)     Height 11/17/18 0916 5\' 10"  (1.778 m)     Head Circumference --      Peak Flow --      Pain Score 11/17/18 0915 5     Pain Loc --      Pain Edu? --      Excl. in GC? --    Constitutional: Alert and oriented. Well appearing and in no distress. Eyes: Normal exam ENT   Head: Normocephalic and atraumatic.   Mouth/Throat: Mucous membranes are moist. Cardiovascular: Normal rate, regular rhythm. No murmur Respiratory: Normal respiratory effort without tachypnea nor retractions. Breath sounds are clear.  Occasional dry sounding cough during exam. Gastrointestinal: Soft and nontender. No distention.   Musculoskeletal: Nontender with normal range of motion in all extremities. No lower extremity tenderness or edema. Neurologic:  Normal speech and language. No gross focal neurologic deficits Skin:  Skin is warm, dry and intact.  Psychiatric: Mood and affect are normal.  ____________________________________________    EKG  EKG viewed and interpreted by myself shows a sinus rhythm at 90 bpm with a narrow QRS, normal axis, normal intervals, nonspecific ST changes.  Largely unchanged from prior EKG 06/14/2018.  ____________________________________________    RADIOLOGY  Chest x-ray is negative  ____________________________________________   INITIAL IMPRESSION / ASSESSMENT AND PLAN / ED COURSE  Pertinent labs & imaging results that were available during my care of the patient were reviewed by me and considered in my medical decision making (see chart for details).  Patient presents to the emergency department for 1  week of dry hacking cough subjective fever.  Differential would include pneumonia, bronchitis, ACS, URI.  Reassuringly patient's labs are largely within normal limits, troponin negative, chest x-ray reassuring.  Patient does have a frequent dry sounding cough during examination, most consistent with acute bronchitis.  We will cover with Zithromax, cough medication and have the patient follow-up with his doctor.  Patient agreeable to plan of care.  ____________________________________________   FINAL CLINICAL IMPRESSION(S) / ED DIAGNOSES  Acute bronchitis   Minna Antis, MD 11/17/18 1124

## 2018-11-17 NOTE — ED Notes (Signed)
Patient ambulatory to Rm 15, Stephaine RN aware of room placement.

## 2018-11-17 NOTE — ED Notes (Signed)
This RN and EDP to bedside at this time to introduce self to patient. Pt states a "fullness in chest", states feels like it is difficult to breathe. Pt also c/o "dry hacky cough" x several days. Pt is alert and oriented at this time, skin warm, dry, and intact, respirations even and unlabored at this time.

## 2018-11-17 NOTE — ED Notes (Signed)
Chest Pain since Saturday without change.  Alert and oriented, color good.  To triage for EKG.

## 2018-11-17 NOTE — ED Notes (Signed)
Pt c/o dry hacking cough with chest tightness since Saturday. States he has not taken anything for the cough.

## 2018-11-17 NOTE — ED Triage Notes (Signed)
Chest pressure / body aches / dry nonproductive cough x2 days

## 2018-11-17 NOTE — ED Notes (Signed)
NAD noted at time of D/C. Pt denies questions or concerns. Pt ambulatory to the lobby at this time to wait for his friend to pick him up. Driving precautions reviewed with patient at this time.

## 2018-12-10 DIAGNOSIS — K210 Gastro-esophageal reflux disease with esophagitis: Secondary | ICD-10-CM | POA: Diagnosis not present

## 2019-01-05 ENCOUNTER — Encounter (INDEPENDENT_AMBULATORY_CARE_PROVIDER_SITE_OTHER): Payer: Self-pay | Admitting: Internal Medicine

## 2019-01-06 ENCOUNTER — Other Ambulatory Visit (HOSPITAL_COMMUNITY)
Admission: RE | Admit: 2019-01-06 | Discharge: 2019-01-06 | Disposition: A | Discharge status: Home / Self Care | Payer: Medicaid Other | Source: Ambulatory Visit | Attending: Internal Medicine | Admitting: Internal Medicine

## 2019-01-06 ENCOUNTER — Other Ambulatory Visit: Payer: Self-pay

## 2019-01-06 ENCOUNTER — Encounter (INDEPENDENT_AMBULATORY_CARE_PROVIDER_SITE_OTHER): Payer: Self-pay | Admitting: Internal Medicine

## 2019-01-06 ENCOUNTER — Ambulatory Visit (INDEPENDENT_AMBULATORY_CARE_PROVIDER_SITE_OTHER): Payer: Medicaid Other | Admitting: Internal Medicine

## 2019-01-06 VITALS — BP 119/76 | HR 98 | Temp 98.1°F | Ht 70.0 in | Wt 211.5 lb

## 2019-01-06 DIAGNOSIS — B1710 Acute hepatitis C without hepatic coma: Secondary | ICD-10-CM

## 2019-01-06 LAB — HEPATIC FUNCTION PANEL
ALT: 71 U/L — ABNORMAL HIGH (ref 0–44)
AST: 49 U/L — ABNORMAL HIGH (ref 15–41)
Albumin: 3.7 g/dL (ref 3.5–5.0)
Alkaline Phosphatase: 80 U/L (ref 38–126)
Bilirubin, Direct: 0.3 mg/dL — ABNORMAL HIGH (ref 0.0–0.2)
Indirect Bilirubin: 1 mg/dL — ABNORMAL HIGH (ref 0.3–0.9)
Total Bilirubin: 1.3 mg/dL — ABNORMAL HIGH (ref 0.3–1.2)
Total Protein: 6.8 g/dL (ref 6.5–8.1)

## 2019-01-06 LAB — CBC
HCT: 42 % (ref 39.0–52.0)
Hemoglobin: 14.4 g/dL (ref 13.0–17.0)
MCH: 31.9 pg (ref 26.0–34.0)
MCHC: 34.3 g/dL (ref 30.0–36.0)
MCV: 93.1 fL (ref 80.0–100.0)
Platelets: 181 10*3/uL (ref 150–400)
RBC: 4.51 MIL/uL (ref 4.22–5.81)
RDW: 13.2 % (ref 11.5–15.5)
WBC: 8.5 10*3/uL (ref 4.0–10.5)
nRBC: 0 % (ref 0.0–0.2)

## 2019-01-06 LAB — RAPID URINE DRUG SCREEN, HOSP PERFORMED
Amphetamines: NOT DETECTED
Barbiturates: NOT DETECTED
Benzodiazepines: NOT DETECTED
Cocaine: NOT DETECTED
Opiates: NOT DETECTED
Tetrahydrocannabinol: NOT DETECTED

## 2019-01-06 LAB — PROTIME-INR
INR: 0.8 (ref 0.8–1.2)
Prothrombin Time: 10.8 seconds — ABNORMAL LOW (ref 11.4–15.2)

## 2019-01-06 NOTE — Progress Notes (Signed)
   Subjective:    Patient ID: Gregory Duran, male    DOB: 1961/05/18, 58 y.o.   MRN: [id number]  PCP Dr. Sudie Bailey.  HPI Referred by Dr. Sudie Bailey for Hepatitis C. States has had Hepatitis C since in his 30s. He has never been treated. No IV drugs.  Has 2 tattoos is only risk factor. Found out he had Hepatitis C when he was in Rehab (alcohol). He is not drinking now. On chronic pain meds for chronic left shoulder pain and back  pain. His appetite is good. No weight loss.  No prior hx of jaundice.  Evaluated by Dr. Karilyn Cota in 2013, but it appears patient did not follow up .   11/29/2018 Hand H 14.5 and 42.6, AST 54, ALT 75, total bili 0.7, ALP 109  Hx of diabetes for about 9 yrs. Hypertension, GI bleed.  Review of Systems Past Medical History:  Diagnosis Date  . Chronic back pain   . Chronic shoulder pain   . Diabetes mellitus (HCC)   . Hepatitis C    Patient states that he does not have hep C.  Will bring test results.  . Hypertension   . Sciatica     Past Surgical History:  Procedure Laterality Date  . ESOPHAGOGASTRODUODENOSCOPY (EGD) WITH PROPOFOL N/A 11/25/2015   Procedure: ESOPHAGOGASTRODUODENOSCOPY (EGD) WITH PROPOFOL;  Surgeon: Malissa Hippo, MD;  Location: [facility];  Service: Endoscopy;  Laterality: N/A;  . KIDNEY STONE SURGERY      Allergies  Allergen Reactions  . Fish Allergy Nausea And Vomiting    Current Outpatient Medications on File Prior to Visit  Medication Sig Dispense Refill  . ALPRAZolam (XANAX) 1 MG tablet Take 1 mg by mouth at bedtime.    Marland Kitchen glipiZIDE (GLUCOTROL) 10 MG tablet Take 20 mg by mouth daily.     Marland Kitchen lisinopril (PRINIVIL,ZESTRIL) 10 MG tablet Take by mouth daily.    . metFORMIN (GLUCOPHAGE) 500 MG tablet Take by mouth 2 (two) times daily with a meal.    . oxyCODONE (ROXICODONE) 5 MG immediate release tablet Take 1 tablet (5 mg total) by mouth every 4 (four) hours as needed for severe pain. (Patient taking differently: Take 10 mg by mouth 3  (three) times daily. ) 20 tablet 0   No current facility-administered medications on file prior to visit.         Objective:   Physical Exam Blood pressure 119/76, pulse 98, temperature 98.1 F (36.7 C), height 5\' 10"  (1.778 m), weight 211 lb 8 oz (95.9 kg).  Alert and oriented. Skin warm and dry. Oral mucosa is moist.   . Sclera anicteric, conjunctivae is pink. Thyroid not enlarged. No cervical lymphadenopathy. Lungs clear. Heart regular rate and rhythm.  Abdomen is soft. Bowel sounds are positive. No hepatomegaly. No abdominal masses felt. No tenderness.  No edema to lower extremities.         Assessment & Plan:  Hepatitis C: Will get acute hepatitis panel, HIV, AFP, PT/INR, CBC, Hepatic, Hep. C genotype, Hep C quaint, Urine drug screen, US abdomen with elast.

## 2019-01-06 NOTE — Patient Instructions (Signed)
Labs and US. 

## 2019-01-07 LAB — HEPATITIS PANEL, ACUTE
HCV Ab: >11 s/co ratio — ABNORMAL HIGH (ref 0.0–0.9)
Hep A IgM: NEGATIVE
Hep B C IgM: NEGATIVE
Hepatitis B Surface Ag: NEGATIVE

## 2019-01-07 LAB — HIV ANTIBODY (ROUTINE TESTING W REFLEX): HIV Screen 4th Generation wRfx: NONREACTIVE

## 2019-01-07 LAB — AFP TUMOR MARKER: AFP, Serum, Tumor Marker: 6.8 ng/mL (ref 0.0–8.3)

## 2019-01-08 LAB — HEPATITIS C GENOTYPE

## 2019-01-09 LAB — HCV RNA QUANT
HCV Quantitative Log: 5.566 log10 IU/mL (ref >1.70)
HCV Quantitative: 368000 IU/mL (ref >50)

## 2019-02-27 ENCOUNTER — Ambulatory Visit (HOSPITAL_COMMUNITY): Payer: Medicaid Other

## 2019-03-04 ENCOUNTER — Other Ambulatory Visit: Payer: Self-pay

## 2019-03-04 ENCOUNTER — Telehealth (INDEPENDENT_AMBULATORY_CARE_PROVIDER_SITE_OTHER): Payer: Self-pay | Admitting: Internal Medicine

## 2019-03-04 ENCOUNTER — Ambulatory Visit (HOSPITAL_COMMUNITY)
Admission: RE | Admit: 2019-03-04 | Discharge: 2019-03-04 | Disposition: A | Discharge status: Home / Self Care | Payer: Medicaid Other | Source: Ambulatory Visit | Attending: Internal Medicine | Admitting: Internal Medicine

## 2019-03-04 DIAGNOSIS — B1710 Acute hepatitis C without hepatic coma: Secondary | ICD-10-CM | POA: Insufficient documentation

## 2019-03-04 NOTE — Telephone Encounter (Signed)
Gregory Duran, Epclusa x 12 weeks. 

## 2019-03-09 NOTE — Telephone Encounter (Signed)
Currently working on a Steele for [employer]. Once we have heard from this company we will contact the patient.

## 2019-04-15 ENCOUNTER — Telehealth (INDEPENDENT_AMBULATORY_CARE_PROVIDER_SITE_OTHER): Payer: Self-pay | Admitting: Internal Medicine

## 2019-04-15 ENCOUNTER — Other Ambulatory Visit (INDEPENDENT_AMBULATORY_CARE_PROVIDER_SITE_OTHER): Payer: Self-pay | Admitting: *Deleted

## 2019-04-15 DIAGNOSIS — B1920 Unspecified viral hepatitis C without hepatic coma: Secondary | ICD-10-CM

## 2019-04-15 NOTE — Telephone Encounter (Signed)
Labs were ordered for 6 weeks. The patient will be sent a letter as a reminder.

## 2019-04-15 NOTE — Telephone Encounter (Signed)
Tammy, hepatic, hep c quaint and CBC in 6 weeks. Please send letter   Mitzie, OV in 2 months.

## 2019-04-27 ENCOUNTER — Other Ambulatory Visit (INDEPENDENT_AMBULATORY_CARE_PROVIDER_SITE_OTHER): Payer: Self-pay | Admitting: *Deleted

## 2019-04-27 DIAGNOSIS — B1920 Unspecified viral hepatitis C without hepatic coma: Secondary | ICD-10-CM

## 2019-06-01 LAB — HEPATIC FUNCTION PANEL
AG Ratio: 1.6 (calc) (ref 1.0–2.5)
ALT: 16 U/L (ref 9–46)
AST: 18 U/L (ref 10–35)
Albumin: 4.4 g/dL (ref 3.6–5.1)
Alkaline phosphatase (APISO): 63 U/L (ref 35–144)
Bilirubin, Direct: 0.2 mg/dL (ref 0.0–0.2)
Globulin: 2.7 g/dL (ref 1.9–3.7)
Indirect Bilirubin: 0.7 mg/dL (ref 0.2–1.2)
Total Bilirubin: 0.9 mg/dL (ref 0.2–1.2)
Total Protein: 7.1 g/dL (ref 6.1–8.1)

## 2019-06-01 LAB — CBC
HCT: 43.7 % (ref 38.5–50.0)
Hemoglobin: 15.1 g/dL (ref 13.2–17.1)
MCH: 31.9 pg (ref 27.0–33.0)
MCHC: 34.6 g/dL (ref 32.0–36.0)
MCV: 92.4 fL (ref 80.0–100.0)
MPV: 11.7 fL (ref 7.5–12.5)
Platelets: 203 10*3/uL (ref 140–400)
RBC: 4.73 10*6/uL (ref 4.20–5.80)
RDW: 12.8 % (ref 11.0–15.0)
WBC: 8.2 10*3/uL (ref 3.8–10.8)

## 2019-06-01 LAB — HEPATITIS C RNA QUANTITATIVE
HCV Quantitative Log: <1.18 Log IU/mL
HCV RNA, PCR, QN: <15 IU/mL

## 2019-06-29 ENCOUNTER — Ambulatory Visit (INDEPENDENT_AMBULATORY_CARE_PROVIDER_SITE_OTHER): Payer: Medicaid Other | Admitting: Internal Medicine

## 2019-06-29 ENCOUNTER — Other Ambulatory Visit: Payer: Self-pay

## 2019-06-29 ENCOUNTER — Encounter (INDEPENDENT_AMBULATORY_CARE_PROVIDER_SITE_OTHER): Payer: Self-pay | Admitting: Internal Medicine

## 2019-06-29 VITALS — BP 139/87 | HR 90 | Temp 98.5°F | Ht 70.0 in | Wt 229.9 lb

## 2019-06-29 DIAGNOSIS — B182 Chronic viral hepatitis C: Secondary | ICD-10-CM

## 2019-06-29 DIAGNOSIS — K219 Gastro-esophageal reflux disease without esophagitis: Secondary | ICD-10-CM | POA: Diagnosis not present

## 2019-06-29 MED ORDER — PANTOPRAZOLE SODIUM 40 MG PO TBEC: 40.0000 mg | DELAYED_RELEASE_TABLET | Freq: Every day | ORAL | 5 refills | Status: DC

## 2019-06-29 NOTE — Patient Instructions (Signed)
Physician will call with result of blood test. Screening colonoscopy to be scheduled in January,2021.

## 2019-06-29 NOTE — Progress Notes (Signed)
Presenting complaint;  Follow-up for hepatitis C.  Database and subjective:  Patient is 58 year old Caucasian male who has chronic hepatitis C genotype 1a who was initially discovered about 13 years ago.  I saw patient in the office in August 2013 but he did not follow-up.  He was reevaluated earlier this year.  Elastography revealed F2/some F3 disease.  Patient was treated with 12 weeks of Epclusa(sofosbuvir 400 mg and 100 mg of velpatasvir). Patient states he finished his therapy yesterday.  He did not experience any side effects.  He does complain of frequent heartburn which she has had lately.  He also experience nausea with smell of food.  However he is not had epigastric pain or vomiting.  He has been using Alka-Seltzer chewables.  He has history of duodenal ulcer(EGD 11/25/2015) secondary to NSAID therapy.  H. pylori serology was negative.  He is admitted to [facility] for hematemesis on August 2019 and noted to have erosive gastritis.  Biopsy for H. pylori was negative.  He had been taking NSAIDs.  Exam did not reveal esophageal or gastric varices.  Patient has chronic back pain for which she uses pain medication.  He also complains of nocturia.  He says some nights he wakes up 8 times and unable to rest.  He says his A1c was 7.56 weeks ago.  He has gained 18 pounds since he was last seen on 01/06/2019. He has not had any alcohol for 3 years.   Current Medications: Outpatient Encounter Medications as of 06/29/2019  Medication Sig  . ALPRAZolam (XANAX) 1 MG tablet Take 1 mg by mouth at bedtime.  . gabapentin (NEURONTIN) 300 MG capsule TAKE 1 CAPSULE BY MOUTH AT BEDTIME FOR 5 DAYS THEN INCREASE TO 1 CAPSULE TWICE DAILY FOR 5 DAYS THEN INCREASE TO 1 CAPSULE THREE TIMES DAILY  . glipiZIDE (GLUCOTROL) 10 MG tablet Take 20 mg by mouth daily.   [facility] lisinopril (PRINIVIL,ZESTRIL) 10 MG tablet Take by mouth 2 (two) times daily.   . metFORMIN (GLUCOPHAGE) 500 MG tablet Take by mouth 2 (two) times daily with a  meal.  . oxyCODONE (ROXICODONE) 5 MG immediate release tablet Take 1 tablet (5 mg total) by mouth every 4 (four) hours as needed for severe pain. (Patient taking differently: Take 10 mg by mouth 3 (three) times daily. )   No facility-administered encounter medications on file as of 06/29/2019.      Objective: Blood pressure 139/87, pulse 90, temperature 98.5 F (36.9 C), temperature source Oral, height _0  (1.778 m), weight 229 lb 14.4 oz (104.3 kg). Patient is alert and in no acute distress. Conjunctiva is pink. Sclera is nonicteric Oropharyngeal mucosa is normal. No neck masses or thyromegaly noted. Cardiac exam with regular rhythm normal S1 and S2. No murmur or gallop noted. Lungs are clear to auscultation. Abdomen is full.  On palpation is soft and nontender with organomegaly or masses. No LE edema or clubbing noted.  Labs/studies Results:  CBC Latest Ref Rng & Units 05/29/2019 01/06/2019 11/17/2018  WBC 3.8 - 10.8 Thousand/uL 8.2 8.5 5.4  Hemoglobin 13.2 - 17.1 g/dL 15.1 14.4 13.5  Hematocrit 38.5 - 50.0 % 43.7 42.0 39.5  Platelets 140 - 400 Thousand/uL 203 181 137(L)    CMP Latest Ref Rng & Units 05/29/2019 01/06/2019 11/17/2018  Glucose 70 - 99 mg/dL - - 195(H)  BUN 6 - 20 mg/dL - - 13  Creatinine 0.61 - 1.24 mg/dL - - 0.76  Sodium 135 - 145 mmol/L - - 133(L)  Potassium 3.5 - 5.1 mmol/L - - 3.9  Chloride 98 - 111 mmol/L - - 100  CO2 22 - 32 mmol/L - - 25  Calcium 8.9 - 10.3 mg/dL - - 8.9  Total Protein 6.1 - 8.1 g/dL 7.1 6.8 -  Total Bilirubin 0.2 - 1.2 mg/dL 0.9 1.3(H) -  Alkaline Phos 38 - 126 U/L - 80 -  AST 10 - 35 U/L 18 49(H) -  ALT 9 - 46 U/L 16 71(H) -    Hepatic Function Latest Ref Rng & Units 05/29/2019 01/06/2019 06/04/2018  Total Protein 6.1 - 8.1 g/dL 7.1 6.8 7.4  Albumin 3.5 - 5.0 g/dL - 3.7 3.3(L)  AST 10 - 35 U/L 18 49(H) 82(H)  ALT 9 - 46 U/L 16 71(H) 67(H)  Alk Phosphatase 38 - 126 U/L - 80 149(H)  Total Bilirubin 0.2 - 1.2 mg/dL 0.9 1.3(H) 1.5(H)   Bilirubin, Direct 0.0 - 0.2 mg/dL 0.2 0.3(H) 0.6(H)     HCVRNA on 05/29/2019 was less than 15 or undetectable  Assessment:  #1.  Chronic hepatitis C genotype 1a.  No history of IV drug use.  He possibly acquired infection due to tattoos.  He has finished 12 weeks of Epclusa.  HCVRNA was undetectable 1 month ago.  Needs to be repeated now and again in 3 to 4 months.  He has responded therapy as his transaminases have normalized. Pretreatment elastography revealed F2/some F3 disease. His other risk factor for chronic liver diseases diabete and fatty liver. Diabetic control needs to be optimal and he must exercise regularly.   #2.  GERD.  He also has a history of peptic ulcer disease and he may have recurrent disease given OTC NSAID use.   Plan:  Patient advised not to use Alka-Seltzer or similar medications which contain NSAIDs. Pantoprazole 40 mg by mouth 30 minutes before breakfast daily for 1 month and thereafter on as-needed basis. Patient will go to the lab for LFTs and HCV by PCR quantitative. Will consider ultrasound with elastography once SVR documented. Patient interested in screening colonoscopy but would like to wait until January 2021.Marland Kitchen Office visit in 6 months.

## 2019-07-03 LAB — HEPATIC FUNCTION PANEL
AG Ratio: 1.6 (calc) (ref 1.0–2.5)
ALT: 18 U/L (ref 9–46)
AST: 17 U/L (ref 10–35)
Albumin: 4.3 g/dL (ref 3.6–5.1)
Alkaline phosphatase (APISO): 73 U/L (ref 35–144)
Bilirubin, Direct: 0.2 mg/dL (ref 0.0–0.2)
Globulin: 2.7 g/dL (calc) (ref 1.9–3.7)
Indirect Bilirubin: 0.5 mg/dL (calc) (ref 0.2–1.2)
Total Bilirubin: 0.7 mg/dL (ref 0.2–1.2)
Total Protein: 7 g/dL (ref 6.1–8.1)

## 2019-07-03 LAB — HEPATITIS C RNA QUANTITATIVE
HCV Quantitative Log: <1.18 Log IU/mL
HCV RNA, PCR, QN: <15 IU/mL

## 2019-07-07 ENCOUNTER — Other Ambulatory Visit (INDEPENDENT_AMBULATORY_CARE_PROVIDER_SITE_OTHER): Payer: Self-pay | Admitting: *Deleted

## 2019-07-07 DIAGNOSIS — B1920 Unspecified viral hepatitis C without hepatic coma: Secondary | ICD-10-CM

## 2019-08-19 ENCOUNTER — Ambulatory Visit (INDEPENDENT_AMBULATORY_CARE_PROVIDER_SITE_OTHER): Payer: Medicaid Other | Admitting: Urology

## 2019-08-19 DIAGNOSIS — R351 Nocturia: Secondary | ICD-10-CM | POA: Diagnosis not present

## 2019-09-10 ENCOUNTER — Other Ambulatory Visit
Admit: 2019-09-10 | Discharge: 2019-09-10 | Disposition: A | Discharge status: Home / Self Care | Attending: Family Medicine | Admitting: Family Medicine

## 2019-09-11 NOTE — Consult Note (Signed)
Pt to the ER with [place] [employer] for forensic blood draw. Pt gives this RN verbal consent to draw 2 tubes for legal use only out of the right [facility] with a 21 gauge butterfly needle. Officers in room to witness draw. Pt tolerated well.

## 2019-09-14 ENCOUNTER — Other Ambulatory Visit (INDEPENDENT_AMBULATORY_CARE_PROVIDER_SITE_OTHER): Payer: Self-pay | Admitting: *Deleted

## 2019-09-14 DIAGNOSIS — B1920 Unspecified viral hepatitis C without hepatic coma: Secondary | ICD-10-CM

## 2019-09-29 ENCOUNTER — Encounter (INDEPENDENT_AMBULATORY_CARE_PROVIDER_SITE_OTHER): Payer: Self-pay | Admitting: *Deleted

## 2019-12-29 ENCOUNTER — Ambulatory Visit (INDEPENDENT_AMBULATORY_CARE_PROVIDER_SITE_OTHER): Payer: Medicaid Other | Admitting: Internal Medicine

## 2020-02-18 ENCOUNTER — Emergency Department
Admission: EM | Admit: 2020-02-18 | Discharge: 2020-02-18 | Disposition: A | Discharge status: Home / Self Care | Payer: Medicaid Other | Attending: Emergency Medicine | Admitting: Emergency Medicine

## 2020-02-18 ENCOUNTER — Other Ambulatory Visit: Payer: Self-pay

## 2020-02-18 ENCOUNTER — Emergency Department: Payer: Medicaid Other

## 2020-02-18 ENCOUNTER — Encounter: Payer: Self-pay | Admitting: Emergency Medicine

## 2020-02-18 DIAGNOSIS — F10920 Alcohol use, unspecified with intoxication, uncomplicated: Secondary | ICD-10-CM | POA: Diagnosis not present

## 2020-02-18 DIAGNOSIS — E119 Type 2 diabetes mellitus without complications: Secondary | ICD-10-CM | POA: Diagnosis not present

## 2020-02-18 DIAGNOSIS — Y908 Blood alcohol level of 240 mg/100 ml or more: Secondary | ICD-10-CM | POA: Insufficient documentation

## 2020-02-18 DIAGNOSIS — I10 Essential (primary) hypertension: Secondary | ICD-10-CM | POA: Insufficient documentation

## 2020-02-18 DIAGNOSIS — Z79899 Other long term (current) drug therapy: Secondary | ICD-10-CM | POA: Diagnosis not present

## 2020-02-18 DIAGNOSIS — Z7984 Long term (current) use of oral hypoglycemic drugs: Secondary | ICD-10-CM | POA: Diagnosis not present

## 2020-02-18 DIAGNOSIS — R4182 Altered mental status, unspecified: Secondary | ICD-10-CM | POA: Diagnosis not present

## 2020-02-18 LAB — COMPREHENSIVE METABOLIC PANEL
ALT: 27 U/L (ref 0–44)
AST: 31 U/L (ref 15–41)
Albumin: 4.7 g/dL (ref 3.5–5.0)
Alkaline Phosphatase: 74 U/L (ref 38–126)
Anion gap: 17 — ABNORMAL HIGH (ref 5–15)
BUN: 22 mg/dL — ABNORMAL HIGH (ref 6–20)
CO2: 23 mmol/L (ref 22–32)
Calcium: 8.9 mg/dL (ref 8.9–10.3)
Chloride: 102 mmol/L (ref 98–111)
Creatinine, Ser: 0.82 mg/dL (ref 0.61–1.24)
GFR calc Af Amer: >60 mL/min (ref >60)
GFR calc non Af Amer: >60 mL/min (ref >60)
Glucose, Bld: 145 mg/dL — ABNORMAL HIGH (ref 70–99)
Potassium: 3.7 mmol/L (ref 3.5–5.1)
Sodium: 142 mmol/L (ref 135–145)
Total Bilirubin: 0.8 mg/dL (ref 0.3–1.2)
Total Protein: 8.2 g/dL — ABNORMAL HIGH (ref 6.5–8.1)

## 2020-02-18 LAB — URINE DRUG SCREEN, QUALITATIVE (ARMC ONLY)
Amphetamines, Ur Screen: NOT DETECTED
Barbiturates, Ur Screen: NOT DETECTED
Benzodiazepine, Ur Scrn: NOT DETECTED
Cannabinoid 50 Ng, Ur ~~LOC~~: NOT DETECTED
Cocaine Metabolite,Ur ~~LOC~~: NOT DETECTED
MDMA (Ecstasy)Ur Screen: NOT DETECTED
Methadone Scn, Ur: NOT DETECTED
Opiate, Ur Screen: NOT DETECTED
Phencyclidine (PCP) Ur S: NOT DETECTED
Tricyclic, Ur Screen: NOT DETECTED

## 2020-02-18 LAB — CBC
HCT: 47.3 % (ref 39.0–52.0)
Hemoglobin: 16.4 g/dL (ref 13.0–17.0)
MCH: 30.7 pg (ref 26.0–34.0)
MCHC: 34.7 g/dL (ref 30.0–36.0)
MCV: 88.6 fL (ref 80.0–100.0)
Platelets: 348 10*3/uL (ref 150–400)
RBC: 5.34 MIL/uL (ref 4.22–5.81)
RDW: 13.2 % (ref 11.5–15.5)
WBC: 13.2 10*3/uL — ABNORMAL HIGH (ref 4.0–10.5)
nRBC: 0 % (ref 0.0–0.2)

## 2020-02-18 LAB — ETHANOL: Alcohol, Ethyl (B): 372 mg/dL (ref <10)

## 2020-02-18 NOTE — ED Provider Notes (Signed)
[facility] Emergency Department Provider Note   ____________________________________________    I have reviewed the triage vital signs and the nursing notes.   HISTORY  Chief Complaint Alcohol Intoxication and Altered Mental Status  History limited by altered mental status   HPI Gregory Duran is a 59 y.o. male with history of diabetes, hypertension who presents today with altered mental status.  Per EMS patient was at local hotel room, hotel room full of beer bottles apparently did have a fall yesterday.  Patient is unable to provide significant history at this time  Past Medical History:  Diagnosis Date  . Chronic back pain   . Chronic shoulder pain   . Diabetes mellitus ([place])   . Hepatitis C    Patient states that he does not have hep C.  Will bring test results.  . Hypertension   . Sciatica     Patient Active Problem List   Diagnosis Date Noted  . GERD (gastroesophageal reflux disease) 06/29/2019  . Gastroesophageal reflux disease with esophagitis   . Acute blood loss anemia   . Controlled type 2 diabetes mellitus with complication, without long-term current use of insulin ([employer])   . Severe dehydration 11/24/2015  . AKI (acute kidney injury) ([place]) 11/24/2015  . Melena 11/24/2015  . Hyponatremia 11/24/2015  . Leukocytosis 11/24/2015  . GI bleed 11/24/2015  . Chronic hepatitis C ([place]) 05/05/2012  . Hypertension 05/05/2012  . Diabetes mellitus ([place]) 05/05/2012  . Chronic low back pain 05/05/2012  . Obesity 05/05/2012    Past Surgical History:  Procedure Laterality Date  . ESOPHAGOGASTRODUODENOSCOPY (EGD) WITH PROPOFOL N/A 11/25/2015   Procedure: ESOPHAGOGASTRODUODENOSCOPY (EGD) WITH PROPOFOL;  Surgeon: Rogene Houston, MD;  [facility];  Service: Endoscopy;  Laterality: N/A;  . KIDNEY STONE SURGERY      Prior to Admission medications   Medication Sig Start Date End Date Taking? Authorizing Provider  ALPRAZolam  Duanne Moron) 1 MG tablet Take 1 mg by mouth at bedtime.    [provider]  gabapentin (NEURONTIN) 300 MG capsule TAKE 1 CAPSULE BY MOUTH AT BEDTIME FOR 5 DAYS THEN INCREASE TO 1 CAPSULE TWICE DAILY FOR 5 DAYS THEN INCREASE TO 1 CAPSULE THREE TIMES DAILY 02/04/19   [provider]  glipiZIDE (GLUCOTROL) 10 MG tablet Take 20 mg by mouth daily.     [provider]  lisinopril-hydrochlorothiazide (ZESTORETIC) 20-25 MG tablet Take 1 tablet by mouth daily.    [provider]  metFORMIN (GLUCOPHAGE) 500 MG tablet Take by mouth 2 (two) times daily with a meal.    [provider]  oxyCODONE (ROXICODONE) 5 MG immediate release tablet Take 1 tablet (5 mg total) by mouth every 4 (four) hours as needed for severe pain. Patient taking differently: Take 10 mg by mouth 3 (three) times daily.  07/02/18   Maudie Flakes, MD  pantoprazole (PROTONIX) 40 MG tablet Take 1 tablet (40 mg total) by mouth daily before breakfast. 06/29/19   Rehman, Mechele Dawley, MD     Allergies Fish allergy  Family History  Problem Relation Age of Onset  . Heart disease Mother   . Heart disease Father   . Healthy Sister   . Hypertension Brother   . Hypertension Brother   . Hypertension Brother   . Diabetes Brother     Social History Social History   Tobacco Use  . Smoking status: Never Smoker  . Smokeless tobacco: Never Used  Substance Use Topics  .  Alcohol use: Yes    Comment: heavily; 2 beers today per pt  . Drug use: No    Level 5 caveat: Unable to obtain review of Systems due to altered mental status     ____________________________________________   PHYSICAL EXAM:  VITAL SIGNS: ED Triage Vitals  Enc Vitals Group     BP 02/18/20 1256 130/77     Pulse Rate 02/18/20 1256 (!) 103     Resp 02/18/20 1256 18     Temp 02/18/20 1256 98.1 F (36.7 C)     Temp Source 02/18/20 1256 Axillary     SpO2 02/18/20 1256 94 %     Weight 02/18/20 1250 104.3 kg (230 lb)     Height  02/18/20 1250 1.829 m (6')     Head Circumference --      Peak Flow --      Pain Score 02/18/20 1250 0     Pain Loc --      Pain Edu? --      Excl. in GC? --     Constitutional: Alert and oriented. Head: Small abrasion to the right forehead Nose: No congestion/rhinnorhea. Mouth/Throat: Mucous membranes are moist.    Cardiovascular: Normal rate, regular rhythm. Grossly normal heart sounds.  Good peripheral circulation. Respiratory: Normal respiratory effort.  No retractions. Lungs CTAB. Gastrointestinal: Soft and nontender. No distention.  No CVA tenderness.  Musculoskeletal:   Warm and well perfused Neurologic:  Normal speech and language. No gross focal neurologic deficits are appreciated.  Skin:  Skin is warm, dry and intact. No rash noted. Psychiatric: Mood and affect are normal. Speech and behavior are normal.  ____________________________________________   LABS (all labs ordered are listed, but only abnormal results are displayed)  Labs Reviewed  COMPREHENSIVE METABOLIC PANEL - Abnormal; Notable for the following components:      Result Value   Glucose, Bld 145 (*)    BUN 22 (*)    Total Protein 8.2 (*)    Anion gap 17 (*)    All other components within normal limits  CBC - Abnormal; Notable for the following components:   WBC 13.2 (*)    All other components within normal limits  URINE DRUG SCREEN, QUALITATIVE ([facility] ONLY)  ETHANOL   ____________________________________________  EKG  ED ECG REPORT I, Jene Every, the attending physician, personally viewed and interpreted this ECG.  Date: 02/18/2020  Rhythm: Sinus tachycardia QRS Axis: normal Intervals: normal ST/T Wave abnormalities: normal Narrative Interpretation: PVCs noted  ____________________________________________  RADIOLOGY  CT head unremarkable ____________________________________________   PROCEDURES  Procedure(s) performed: No  Procedures   Critical Care performed:  No ____________________________________________   INITIAL IMPRESSION / ASSESSMENT AND PLAN / ED COURSE  Pertinent labs & imaging results that were available during my care of the patient were reviewed by me and considered in my medical decision making (see chart for details).  Patient presents with altered mental status.  Reportedly hotel room with multiple beer bottles making alcohol intoxication the likely cause of his altered mental status.  However will obtain CT head given reports of fall and evidence of minor trauma to the head.  Lab work thus far is overall reassuring, nonspecific elevation in white blood cell count.  Mildly elevated anion gap likely related to alcohol intoxication, CO2 normal, glucose normal    ____________________________________________   FINAL CLINICAL IMPRESSION(S) / ED DIAGNOSES  Final diagnoses:  None        Note:  This document was prepared using Dragon voice  recognition software and may include unintentional dictation errors.   Jene Every, MD 02/18/20 [phone]

## 2020-02-18 NOTE — ED Notes (Signed)
Pt arousable to voice, patient in bed comfortable at this time, no needs noted. Will continue to monitor.

## 2020-02-18 NOTE — ED Triage Notes (Signed)
Pt arrival via [facility] from local hotel room due to alcohol intoxication. Pt was staying at local hotel yesterday when he fell and the hotel called the police. Pt denied services to be transported here. Pt was supposed to check out today but didn't, so the hotel called the police again. Police and EMS arrived to the patient having altered mental status, patient unable to answer questions.   EMS states patient's hotel room was full of beer bottles. Pt did hit head yesterday when he fell.   Pt VS: BP 139/78 HR 104 O2 95% CBG- 152

## 2020-02-18 NOTE — ED Provider Notes (Signed)
-----------------------------------------   3:04 PM on 02/18/2020 -----------------------------------------  Blood pressure 130/77, pulse (!) 103, temperature 98.1 F (36.7 C), temperature source Axillary, resp. rate 18, height 6' (1.829 m), weight 104.3 kg, SpO2 94 %.  Assuming care from Dr. Cyril Loosen.  In short, Gregory Duran is a 59 y.o. male with a chief complaint of Alcohol Intoxication and Altered Mental Status .  Refer to the original H&P for additional details.  The current plan of care is to observe until clinically sober or patient has a safe ride home.  ----------------------------------------- 8:09 PM on 02/18/2020 -----------------------------------------  Patient now appears clinically sober, ambulating without difficulty and tolerating p.o.  He is appropriate for discharge home.    Chesley Noon, MD 02/18/20 2010

## 2020-02-18 NOTE — ED Notes (Signed)
Pt transported to CT ?

## 2020-02-27 ENCOUNTER — Encounter: Payer: Self-pay | Admitting: Emergency Medicine

## 2020-02-27 ENCOUNTER — Emergency Department
Admission: EM | Admit: 2020-02-27 | Discharge: 2020-02-29 | Disposition: A | Discharge status: Rehabilitation Facility | Payer: Medicaid Other | Attending: Emergency Medicine | Admitting: Emergency Medicine

## 2020-02-27 ENCOUNTER — Other Ambulatory Visit: Payer: Self-pay

## 2020-02-27 DIAGNOSIS — Z20822 Contact with and (suspected) exposure to covid-19: Secondary | ICD-10-CM | POA: Insufficient documentation

## 2020-02-27 DIAGNOSIS — F10129 Alcohol abuse with intoxication, unspecified: Secondary | ICD-10-CM | POA: Diagnosis not present

## 2020-02-27 DIAGNOSIS — Z79899 Other long term (current) drug therapy: Secondary | ICD-10-CM | POA: Insufficient documentation

## 2020-02-27 DIAGNOSIS — E119 Type 2 diabetes mellitus without complications: Secondary | ICD-10-CM | POA: Insufficient documentation

## 2020-02-27 DIAGNOSIS — G8929 Other chronic pain: Secondary | ICD-10-CM | POA: Diagnosis not present

## 2020-02-27 DIAGNOSIS — I10 Essential (primary) hypertension: Secondary | ICD-10-CM | POA: Diagnosis not present

## 2020-02-27 DIAGNOSIS — F1010 Alcohol abuse, uncomplicated: Secondary | ICD-10-CM

## 2020-02-27 DIAGNOSIS — Y907 Blood alcohol level of 200-239 mg/100 ml: Secondary | ICD-10-CM | POA: Insufficient documentation

## 2020-02-27 DIAGNOSIS — Z7984 Long term (current) use of oral hypoglycemic drugs: Secondary | ICD-10-CM | POA: Insufficient documentation

## 2020-02-27 DIAGNOSIS — M545 Low back pain, unspecified: Secondary | ICD-10-CM

## 2020-02-27 LAB — COMPREHENSIVE METABOLIC PANEL
ALT: 43 U/L (ref 0–44)
AST: 78 U/L — ABNORMAL HIGH (ref 15–41)
Albumin: 3.8 g/dL (ref 3.5–5.0)
Alkaline Phosphatase: 90 U/L (ref 38–126)
Anion gap: 17 — ABNORMAL HIGH (ref 5–15)
BUN: 11 mg/dL (ref 6–20)
CO2: 22 mmol/L (ref 22–32)
Calcium: 8.6 mg/dL — ABNORMAL LOW (ref 8.9–10.3)
Chloride: 97 mmol/L — ABNORMAL LOW (ref 98–111)
Creatinine, Ser: 0.93 mg/dL (ref 0.61–1.24)
GFR calc Af Amer: >60 mL/min (ref >60)
GFR calc non Af Amer: >60 mL/min (ref >60)
Glucose, Bld: 372 mg/dL — ABNORMAL HIGH (ref 70–99)
Potassium: 3.1 mmol/L — ABNORMAL LOW (ref 3.5–5.1)
Sodium: 136 mmol/L (ref 135–145)
Total Bilirubin: 1.2 mg/dL (ref 0.3–1.2)
Total Protein: 7 g/dL (ref 6.5–8.1)

## 2020-02-27 LAB — URINALYSIS, ROUTINE W REFLEX MICROSCOPIC
Bacteria, UA: NONE SEEN
Bilirubin Urine: NEGATIVE
Glucose, UA: >=500 mg/dL — AB
Ketones, ur: 5 mg/dL — AB
Leukocytes,Ua: NEGATIVE
Nitrite: NEGATIVE
Protein, ur: 30 mg/dL — AB
Specific Gravity, Urine: 1.027 (ref 1.005–1.030)
pH: 6 (ref 5.0–8.0)

## 2020-02-27 LAB — URINE DRUG SCREEN, QUALITATIVE (ARMC ONLY)
Amphetamines, Ur Screen: POSITIVE — AB
Barbiturates, Ur Screen: NOT DETECTED
Benzodiazepine, Ur Scrn: NOT DETECTED
Cannabinoid 50 Ng, Ur ~~LOC~~: NOT DETECTED
Cocaine Metabolite,Ur ~~LOC~~: POSITIVE — AB
MDMA (Ecstasy)Ur Screen: NOT DETECTED
Methadone Scn, Ur: NOT DETECTED
Opiate, Ur Screen: NOT DETECTED
Phencyclidine (PCP) Ur S: NOT DETECTED
Tricyclic, Ur Screen: NOT DETECTED

## 2020-02-27 LAB — CBC
HCT: 36.5 % — ABNORMAL LOW (ref 39.0–52.0)
Hemoglobin: 12.8 g/dL — ABNORMAL LOW (ref 13.0–17.0)
MCH: 31.7 pg (ref 26.0–34.0)
MCHC: 35.1 g/dL (ref 30.0–36.0)
MCV: 90.3 fL (ref 80.0–100.0)
Platelets: 123 10*3/uL — ABNORMAL LOW (ref 150–400)
RBC: 4.04 MIL/uL — ABNORMAL LOW (ref 4.22–5.81)
RDW: 14.6 % (ref 11.5–15.5)
WBC: 5.3 10*3/uL (ref 4.0–10.5)
nRBC: 0 % (ref 0.0–0.2)

## 2020-02-27 LAB — MAGNESIUM: Magnesium: 2 mg/dL (ref 1.7–2.4)

## 2020-02-27 LAB — ETHANOL: Alcohol, Ethyl (B): 216 mg/dL — ABNORMAL HIGH (ref <10)

## 2020-02-27 LAB — SARS CORONAVIRUS 2 BY RT PCR (HOSPITAL ORDER, PERFORMED IN ~~LOC~~ HOSPITAL LAB): SARS Coronavirus 2: NEGATIVE

## 2020-02-27 MED ORDER — LISINOPRIL 10 MG PO TABS
20.0000 mg | ORAL_TABLET | Freq: Every day | ORAL | Status: DC
  Administered 2020-02-27 – 2020-02-29 (×3): 20 mg via ORAL
  Filled 2020-02-27 (×3): qty 2

## 2020-02-27 MED ORDER — LORAZEPAM 2 MG/ML IJ SOLN
0.0000 mg | Freq: Four times a day (QID) | INTRAMUSCULAR | Status: AC
  Administered 2020-02-29: 1 mg via INTRAVENOUS

## 2020-02-27 MED ORDER — THIAMINE HCL 100 MG/ML IJ SOLN: 100.0000 mg | Freq: Every day | INTRAMUSCULAR | Status: DC

## 2020-02-27 MED ORDER — LISINOPRIL-HYDROCHLOROTHIAZIDE 20-25 MG PO TABS
1.0000 | ORAL_TABLET | Freq: Every day | ORAL | Status: DC
  Administered 2020-02-27: 1 via ORAL

## 2020-02-27 MED ORDER — HYDROCHLOROTHIAZIDE 25 MG PO TABS
25.0000 mg | ORAL_TABLET | Freq: Every day | ORAL | Status: DC
  Administered 2020-02-27 – 2020-02-29 (×3): 25 mg via ORAL
  Filled 2020-02-27 (×3): qty 1

## 2020-02-27 MED ORDER — LORAZEPAM 2 MG PO TABS
2.0000 mg | ORAL_TABLET | Freq: Once | ORAL | Status: DC
  Filled 2020-02-27: qty 1

## 2020-02-27 MED ORDER — LORAZEPAM 2 MG/ML IJ SOLN: 0.0000 mg | Freq: Two times a day (BID) | INTRAMUSCULAR | Status: DC

## 2020-02-27 MED ORDER — GLIPIZIDE 10 MG PO TABS
20.0000 mg | ORAL_TABLET | Freq: Every day | ORAL | Status: DC
  Administered 2020-02-27 – 2020-02-29 (×3): 20 mg via ORAL
  Filled 2020-02-27 (×4): qty 2

## 2020-02-27 MED ORDER — LORAZEPAM 2 MG PO TABS: 0.0000 mg | ORAL_TABLET | Freq: Two times a day (BID) | ORAL | Status: DC

## 2020-02-27 MED ORDER — METFORMIN HCL 500 MG PO TABS
500.0000 mg | ORAL_TABLET | Freq: Two times a day (BID) | ORAL | Status: DC
  Administered 2020-02-27 – 2020-02-29 (×3): 500 mg via ORAL
  Filled 2020-02-27 (×3): qty 1

## 2020-02-27 MED ORDER — POTASSIUM CHLORIDE CRYS ER 20 MEQ PO TBCR
40.0000 meq | EXTENDED_RELEASE_TABLET | Freq: Once | ORAL | Status: AC
  Administered 2020-02-27: 40 meq via ORAL
  Filled 2020-02-27: qty 2

## 2020-02-27 MED ORDER — LORAZEPAM 2 MG PO TABS
0.0000 mg | ORAL_TABLET | Freq: Four times a day (QID) | ORAL | Status: AC
  Administered 2020-02-27: 2 mg via ORAL
  Administered 2020-02-27 – 2020-02-28 (×2): 1 mg via ORAL
  Administered 2020-02-29: 2 mg via ORAL
  Filled 2020-02-27 (×4): qty 1

## 2020-02-27 MED ORDER — ALPRAZOLAM 0.5 MG PO TABS
1.0000 mg | ORAL_TABLET | Freq: Every day | ORAL | Status: DC
  Administered 2020-02-27 – 2020-02-28 (×2): 1 mg via ORAL
  Filled 2020-02-27 (×2): qty 2

## 2020-02-27 MED ORDER — THIAMINE HCL 100 MG PO TABS
100.0000 mg | ORAL_TABLET | Freq: Every day | ORAL | Status: DC
  Administered 2020-02-27 – 2020-02-29 (×3): 100 mg via ORAL
  Filled 2020-02-27 (×3): qty 1

## 2020-02-27 NOTE — ED Notes (Signed)
Hourly rounding reveals patient in room. No complaints, stable, in no acute distress. Q15 minute rounds and monitoring via Rover and Officer to continue.   

## 2020-02-27 NOTE — ED Notes (Signed)
"   Patient reports here for help" " states he lives with homeless people whom have been trying to help him with him lower back pain" patient reports daily drinker, last drink this morning, does not want to use any drugs, and that his homeless friends gave him a mix of meth and cocaine to help him get over the foot and back pain. Patient states does not want to go back there and needs help with his drinking problem;em.

## 2020-02-27 NOTE — ED Notes (Signed)
Report to include Situation, Background, Assessment, and Recommendations received from [facility]. Patient alert and oriented, warm and dry, in no acute distress. Patient denies SI, HI, AVH and pain. Patient made aware of Q15 minute rounds and Psychologist, counselling presence for their safety. Patient was educated and informed to come to me with needs or concerns.

## 2020-02-27 NOTE — BH Assessment (Signed)
Referral information for detox treatment faxed to;   [facility] [place] [facility] [phone] or [phone])  . [facility] [phone])  . [facility] [phone])  . [facility] [phone])  . [facility] [phone])

## 2020-02-27 NOTE — ED Triage Notes (Addendum)
Pt arrived via [facility] with c/o back pain that radiates down to legs and feet. Pt states he has tried to self medicate, pt is asking to be detoxed from alcohol, pain control and food.  Pt reports he drank 3 beers since 2am as well as several shots of liquor.  Pt states he typically drinks "all I can get my hands on"  Pt denies any SI/HI.  Pt states he has had MRI's for his back done in the past.

## 2020-02-27 NOTE — ED Triage Notes (Signed)
Pt comes into the ED via EMS from the local [employer] [address] wanting alcohol detox and help for his back pain, CBG 392.Marland Kitchen

## 2020-02-27 NOTE — ED Provider Notes (Signed)
[facility] Emergency Department Provider Note   ____________________________________________   First MD Initiated Contact with Patient 02/27/20 1447     (approximate)  I have reviewed the triage vital signs and the nursing notes.   HISTORY  Chief Complaint Alcohol Problem    HPI Gregory Duran is a 59 y.o. male with past medical history of hypertension, diabetes, alcohol abuse, and chronic back pain who presents to the ED requesting detox.  Patient reports that he has been drinking "any alcohol I can get my hands on".  He states he uses alcohol to self medicate his chronic back pain, which has been going on for many years.  He denies any difficulty urinating or with bowel movements, has not had any numbness or weakness in his lower extremities or groin.  Back pain is unchanged today and he denies any recent trauma, states he would not like to receive any narcotic pain medicine for his back.  He states he primarily would like help with detox from alcohol, his last drink was earlier this morning.  He denies any drug abuse, also denies any suicidal or homicidal ideation.        Past Medical History:  Diagnosis Date  . Chronic back pain   . Chronic shoulder pain   . Diabetes mellitus ([facility])   . Hepatitis C    Patient states that he does not have hep C.  Will bring test results.  . Hypertension   . Sciatica     Patient Active Problem List   Diagnosis Date Noted  . GERD (gastroesophageal reflux disease) 06/29/2019  . Gastroesophageal reflux disease with esophagitis   . Acute blood loss anemia   . Controlled type 2 diabetes mellitus with complication, without long-term current use of insulin ([facility])   . Severe dehydration 11/24/2015  . AKI (acute kidney injury) ([facility]) 11/24/2015  . Melena 11/24/2015  . Hyponatremia 11/24/2015  . Leukocytosis 11/24/2015  . GI bleed 11/24/2015  . Chronic hepatitis C ([facility]) 05/05/2012  . Hypertension 05/05/2012  .  Diabetes mellitus ([facility]) 05/05/2012  . Chronic low back pain 05/05/2012  . Obesity 05/05/2012    Past Surgical History:  Procedure Laterality Date  . ESOPHAGOGASTRODUODENOSCOPY (EGD) WITH PROPOFOL N/A 11/25/2015   Procedure: ESOPHAGOGASTRODUODENOSCOPY (EGD) WITH PROPOFOL;  Surgeon: Malissa Hippo, MD;  [facility];  Service: Endoscopy;  Laterality: N/A;  . KIDNEY STONE SURGERY      Prior to Admission medications   Medication Sig Start Date End Date Taking? Authorizing Provider  ALPRAZolam Prudy Feeler) 1 MG tablet Take 1 mg by mouth at bedtime.    [provider]  gabapentin (NEURONTIN) 300 MG capsule TAKE 1 CAPSULE BY MOUTH AT BEDTIME FOR 5 DAYS THEN INCREASE TO 1 CAPSULE TWICE DAILY FOR 5 DAYS THEN INCREASE TO 1 CAPSULE THREE TIMES DAILY 02/04/19   [provider]  glipiZIDE (GLUCOTROL) 10 MG tablet Take 20 mg by mouth daily.     [provider]  lisinopril-hydrochlorothiazide (ZESTORETIC) 20-25 MG tablet Take 1 tablet by mouth daily.    [provider]  metFORMIN (GLUCOPHAGE) 500 MG tablet Take by mouth 2 (two) times daily with a meal.    [provider]  oxyCODONE (ROXICODONE) 5 MG immediate release tablet Take 1 tablet (5 mg total) by mouth every 4 (four) hours as needed for severe pain. Patient taking differently: Take 10 mg by mouth 3 (three) times daily.  07/02/18   Sabas Sous, MD  pantoprazole (PROTONIX)  40 MG tablet Take 1 tablet (40 mg total) by mouth daily before breakfast. 06/29/19   Rehman, Joline Maxcy, MD    Allergies Fish allergy  Family History  Problem Relation Age of Onset  . Heart disease Mother   . Heart disease Father   . Healthy Sister   . Hypertension Brother   . Hypertension Brother   . Hypertension Brother   . Diabetes Brother     Social History Social History   Tobacco Use  . Smoking status: Never Smoker  . Smokeless tobacco: Never Used  Substance Use Topics  . Alcohol use: Yes    Comment:  heavily; 2 beers today per pt  . Drug use: No    Review of Systems  Constitutional: No fever/chills Eyes: No visual changes. ENT: No sore throat. Cardiovascular: Denies chest pain. Respiratory: Denies shortness of breath. Gastrointestinal: No abdominal pain.  No nausea, no vomiting.  No diarrhea.  No constipation. Genitourinary: Negative for dysuria. Musculoskeletal: Positive for back pain. Skin: Negative for rash. Neurological: Negative for headaches, focal weakness or numbness.  ____________________________________________   PHYSICAL EXAM:  VITAL SIGNS: ED Triage Vitals  Enc Vitals Group     BP 02/27/20 1021 124/69     Pulse Rate 02/27/20 1021 99     Resp 02/27/20 1021 20     Temp 02/27/20 1021 97.8 F (36.6 C)     Temp Source 02/27/20 1021 Oral     SpO2 02/27/20 1021 98 %     Weight 02/27/20 1022 240 lb (108.9 kg)     Height 02/27/20 1022 5\' 10"  (1.778 m)     Head Circumference --      Peak Flow --      Pain Score 02/27/20 1025 10     Pain Loc --      Pain Edu? --      Excl. in GC? --     Constitutional: Alert and oriented. Eyes: Conjunctivae are normal. Head: Atraumatic. Nose: No congestion/rhinnorhea. Mouth/Throat: Mucous membranes are moist. Neck: Normal ROM Cardiovascular: Normal rate, regular rhythm. Grossly normal heart sounds. Respiratory: Normal respiratory effort.  No retractions. Lungs CTAB. Gastrointestinal: Soft and nontender. No distention. Genitourinary: deferred Musculoskeletal: No lower extremity tenderness nor edema. Neurologic:  Normal speech and language. No gross focal neurologic deficits are appreciated. Skin:  Skin is warm, dry and intact. No rash noted. Psychiatric: Mood and affect are normal. Speech and behavior are normal.  ____________________________________________   LABS (all labs ordered are listed, but only abnormal results are displayed)  Labs Reviewed  COMPREHENSIVE METABOLIC PANEL - Abnormal; Notable for the following  components:      Result Value   Potassium 3.1 (*)    Chloride 97 (*)    Glucose, Bld 372 (*)    Calcium 8.6 (*)    AST 78 (*)    Anion gap 17 (*)    All other components within normal limits  ETHANOL - Abnormal; Notable for the following components:   Alcohol, Ethyl (B) 216 (*)    All other components within normal limits  CBC - Abnormal; Notable for the following components:   RBC 4.04 (*)    Hemoglobin 12.8 (*)    HCT 36.5 (*)    Platelets 123 (*)    All other components within normal limits  URINE DRUG SCREEN, QUALITATIVE ([facility] ONLY) - Abnormal; Notable for the following components:   Amphetamines, Ur Screen POSITIVE (*)    Cocaine Metabolite,Ur [place] POSITIVE (*)  All other components within normal limits  URINALYSIS, ROUTINE W REFLEX MICROSCOPIC - Abnormal; Notable for the following components:   Color, Urine YELLOW (*)    APPearance CLEAR (*)    Glucose, UA >=500 (*)    Hgb urine dipstick SMALL (*)    Ketones, ur 5 (*)    Protein, ur 30 (*)    All other components within normal limits  SARS CORONAVIRUS 2 BY RT PCR (HOSPITAL ORDER, [place] [facility])  MAGNESIUM     PROCEDURES  Procedure(s) performed (including Critical Care):  Procedures   ____________________________________________   INITIAL IMPRESSION / ASSESSMENT AND PLAN / ED COURSE       59 year old male with history of hypertension, diabetes, alcohol abuse, and chronic back pain presents to the ED with requesting assistance with alcohol detox.  He states his last drink was earlier this morning and he does not have any apparent alcohol withdrawal symptoms at this time, will place on CIWA protocol.  He denies any SI or HI, will hold off on psychiatric consultation and will consult TTS for assistance with potential detox.  His chronic back pain is unchanged and he is neurovascular intact to his lower extremities, no indication for acute imaging at this time.  Patient to be turned over  pending further assistance with detox.      ____________________________________________   FINAL CLINICAL IMPRESSION(S) / ED DIAGNOSES  Final diagnoses:  Alcohol abuse  Chronic midline low back pain without sciatica     ED Discharge Orders    None       Note:  This document was prepared using Dragon voice recognition software and may include unintentional dictation errors.   Blake Divine, MD 02/27/20 1550

## 2020-02-27 NOTE — BH Assessment (Signed)
Assessment Note  Gregory Duran is an 59 y.o. male who presents to the ER seeking help with alcohol detox. Patient states he drinks on a daily basis, in an unknown amount. He has drank this way for approximately several months. He's drinking beer and liquor. Patient also admits to the use of cocaine but doesn't believe it's a problem. In the past, his symptoms of withdrawal were, cold chills and sweats and some shakes and nausea. He denies history of seizures and blackouts.  During the interview, the patient was calm, cooperative and pleasant. He was able to provide appropriate answers. He denies SI/HI and AV/H. He has an upcoming court date 03/02/2020 for probation violation.  Diagnosis: Substance Use Disorder  Past Medical History:  Past Medical History:  Diagnosis Date  . Chronic back pain   . Chronic shoulder pain   . Diabetes mellitus ([place])   . Hepatitis C    Patient states that he does not have hep C.  Will bring test results.  . Hypertension   . Sciatica     Past Surgical History:  Procedure Laterality Date  . ESOPHAGOGASTRODUODENOSCOPY (EGD) WITH PROPOFOL N/A 11/25/2015   Procedure: ESOPHAGOGASTRODUODENOSCOPY (EGD) WITH PROPOFOL;  Surgeon: Rogene Houston, MD;  Location: [facility];  Service: Endoscopy;  Laterality: N/A;  . KIDNEY STONE SURGERY      Family History:  Family History  Problem Relation Age of Onset  . Heart disease Mother   . Heart disease Father   . Healthy Sister   . Hypertension Brother   . Hypertension Brother   . Hypertension Brother   . Diabetes Brother     Social History:  reports that he has never smoked. He has never used smokeless tobacco. He reports current alcohol use. He reports that he does not use drugs.  Additional Social History:  Alcohol / Drug Use Pain Medications: See PTA Prescriptions: See PTA Over the Counter: See PTA History of alcohol / drug use?: Yes Longest period of sobriety (when/how long): Six years Substance #1 Name  of Substance 1: Alcohol 1 - Age of First Use: "I was a child" 1 - Amount (size/oz): Unable to quantify 1 - Frequency: Daily 1 - Duration: Unable to quantify 1 - Last Use / Amount: 02/27/2020 Substance #2 Name of Substance 2: Cocaine 2 - Age of First Use: 59 2 - Amount (size/oz): Cocaine 2 - Frequency: Daily 2 - Duration: Several months 2 - Last Use / Amount: 02/26/2020  CIWA: CIWA-Ar BP: 128/72 Pulse Rate: 84 Nausea and Vomiting: no nausea and no vomiting Tactile Disturbances: none Tremor: not visible, but can be felt fingertip to fingertip Auditory Disturbances: not present Paroxysmal Sweats: barely perceptible sweating, palms moist Visual Disturbances: not present Anxiety: mildly anxious Headache, Fullness in Head: none present Agitation: normal activity Orientation and Clouding of Sensorium: oriented and can do serial additions CIWA-Ar Total: 3 COWS:    Allergies:  Allergies  Allergen Reactions  . Fish Allergy Nausea And Vomiting    Home Medications: (Not in a hospital admission)   OB/GYN Status:  No LMP for male patient.  General Assessment Data Location of Assessment: [facility] ED TTS Assessment: In system Is this a Tele or Face-to-Face Assessment?: Face-to-Face Is this an Initial Assessment or a Re-assessment for this encounter?: Initial Assessment Patient Accompanied by:: N/A Language Other than English: No Living Arrangements: Homeless/Shelter What gender do you identify as?: Male Marital status: Single Pregnancy Status: No Living Arrangements: Alone Can pt return to current living  arrangement?: Yes Admission Status: Voluntary Is patient capable of signing voluntary admission?: Yes Referral Source: Self/Family/Friend Insurance type: Medicaid  Medical Screening Exam [facility] Walk-in ONLY) Medical Exam completed: Yes  Crisis Care Plan Living Arrangements: Alone Legal Guardian: Other:(Self) Name of Psychiatrist: Reports of none Name of Therapist: Reports  of none  Education Status Is patient currently in school?: No  Risk to self with the past 6 months Suicidal Ideation: No Has patient been a risk to self within the past 6 months prior to admission? : No Suicidal Intent: No Has patient had any suicidal intent within the past 6 months prior to admission? : No Is patient at risk for suicide?: No Suicidal Plan?: No Has patient had any suicidal plan within the past 6 months prior to admission? : No Access to Means: No What has been your use of drugs/alcohol within the last 12 months?: alcohol & cocaine Previous Attempts/Gestures: No How many times?: 0 Other Self Harm Risks: Active substance use Triggers for Past Attempts: None known Intentional Self Injurious Behavior: None Family Suicide History: No Recent stressful life event(s): Other (Comment)(Active substance use) Persecutory voices/beliefs?: No Depression: Yes Depression Symptoms: Tearfulness, Isolating, Guilt, Loss of interest in usual pleasures, Feeling worthless/self pity Substance abuse history and/or treatment for substance abuse?: Yes Suicide prevention information given to non-admitted patients: Not applicable  Risk to Others within the past 6 months Homicidal Ideation: No Does patient have any lifetime risk of violence toward others beyond the six months prior to admission? : No Thoughts of Harm to Others: No Current Homicidal Intent: No Current Homicidal Plan: No Access to Homicidal Means: No Identified Victim: Reports of none History of harm to others?: No Assessment of Violence: None Noted Does patient have access to weapons?: No Criminal Charges Pending?: Yes Describe Pending Criminal Charges: Prob. Viol Does patient have a court date: Yes Court Date: 03/02/20 Is patient on probation?: Yes  Psychosis Hallucinations: None noted Delusions: None noted  Mental Status Report Appearance/Hygiene: Unremarkable, In scrubs Eye Contact: Good Motor Activity:  Freedom of movement, Unremarkable Speech: Logical/coherent, Unremarkable Level of Consciousness: Alert Mood: Depressed, Anxious, Sad, Pleasant Affect: Appropriate to circumstance, Anxious, Sad Anxiety Level: Minimal Thought Processes: Coherent, Relevant Judgement: Unimpaired Orientation: Person, Place, Time, Situation, Appropriate for developmental age Obsessive Compulsive Thoughts/Behaviors: None  Cognitive Functioning Concentration: Normal Memory: Recent Intact, Remote Intact Is patient IDD: No Insight: Fair Impulse Control: Fair Appetite: Fair Have you had any weight changes? : No Change Sleep: No Change Total Hours of Sleep: 7 Vegetative Symptoms: None  ADLScreening [facility] Assessment Services) Patient's cognitive ability adequate to safely complete daily activities?: Yes Patient able to express need for assistance with ADLs?: Yes Independently performs ADLs?: Yes (appropriate for developmental age)  Prior Inpatient Therapy Prior Inpatient Therapy: Yes Prior Therapy Dates: Unable to remember the dates Prior Therapy Facilty/Provider(s): [facility] & [facility] Reason for Treatment: Substance Abuse  Prior Outpatient Therapy Prior Outpatient Therapy: No Does patient have an ACCT team?: No Does patient have Intensive In-House Services?  : No Does patient have [facility] services? : No Does patient have P4CC services?: No  ADL Screening (condition at time of admission) Patient's cognitive ability adequate to safely complete daily activities?: Yes Is the patient deaf or have difficulty hearing?: No Does the patient have difficulty seeing, even when wearing glasses/contacts?: No Does the patient have difficulty concentrating, remembering, or making decisions?: No Patient able to express need for assistance with ADLs?: Yes Does the patient have difficulty dressing or bathing?: No Independently  performs ADLs?: Yes (appropriate for developmental age) Does the patient have difficulty  walking or climbing stairs?: No Weakness of Legs: None Weakness of Arms/Hands: None  Home Assistive Devices/Equipment Home Assistive Devices/Equipment: None  Therapy Consults (therapy consults require a physician order) PT Evaluation Needed: No OT Evalulation Needed: No SLP Evaluation Needed: No Abuse/Neglect Assessment (Assessment to be complete while patient is alone) Abuse/Neglect Assessment Can Be Completed: Yes Physical Abuse: Denies Verbal Abuse: Denies Sexual Abuse: Denies Exploitation of patient/patient's resources: Denies Self-Neglect: Denies Values / Beliefs Cultural Requests During Hospitalization: None Spiritual Requests During Hospitalization: None Consults Spiritual Care Consult Needed: No Transition of Care Team Consult Needed: No Advance Directives (For Healthcare) Does Patient Have a Medical Advance Directive?: No Would patient like information on creating a medical advance directive?: No - Patient declined  Child/Adolescent Assessment Running Away Risk: Denies(Patient is an adult)  Disposition:  Disposition Initial Assessment Completed for this Encounter: Yes  On Site Evaluation by:   Reviewed with Physician:    Lilyan Gilford MS, LCAS, [facility], NCC Therapeutic Triage Specialist 02/27/2020 5:17 PM

## 2020-02-27 NOTE — BH Assessment (Signed)
Referral information for detox treatment faxed to;    [facility] [phone] or [phone])   [facility] [phone]) Unable to accept insurance patient has Medicaid   [facility] [phone]) Admissions currently closed, will re-open tomorrow at 10am   [facility] [phone]) Denied due to being positive for Methamphetamine and upcoming court date   [facility] [phone]) Admissions staff Mikle Bosworth reports no current beds, contact tomorrow morning after 9am

## 2020-02-27 NOTE — ED Notes (Signed)
Evaluated by TTS, patient sitting up in bed calm and cooperative. Safety maintain will monitor.

## 2020-02-28 MED ORDER — POTASSIUM CHLORIDE CRYS ER 20 MEQ PO TBCR
40.0000 meq | EXTENDED_RELEASE_TABLET | Freq: Once | ORAL | Status: AC
  Administered 2020-02-28: 40 meq via ORAL
  Filled 2020-02-28: qty 2

## 2020-02-28 NOTE — ED Notes (Signed)
Hourly rounding reveals patient in room. No complaints, stable, in no acute distress. Q15 minute rounds and monitoring via Rover and Officer to continue.   

## 2020-02-28 NOTE — ED Provider Notes (Signed)
Emergency Medicine Observation Re-evaluation Note  Gregory Duran is a 59 y.o. male, seen on rounds today.  Pt initially presented to the ED for complaints of Alcohol Problem Currently, the patient is resting in no acute distress.  Physical Exam  BP 133/62   Pulse (!) 113   Temp 98.8 F (37.1 C) (Axillary)   Resp 18   Ht 5\' 10"  (1.778 m)   Wt 108.9 kg   SpO2 98%   BMI 34.44 kg/m  Physical Exam  ED Course / MDM  EKG:    I have reviewed the labs performed to date as well as medications administered while in observation.  Recent changes in the last 24 hours include no events overnight. Plan  Current plan is for detox placement. Patient is not under full IVC at this time.   , MD 02/28/20 [phone]
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
## 2020-02-28 NOTE — ED Notes (Signed)
Hourly rounding reveals patient in room,with eyes closed. No complaints, stable, in no acute distress. Q15 minute rounds and monitoring via Rover and Officer to continue.   

## 2020-02-28 NOTE — BH Assessment (Signed)
Referral information for detox treatmentfaxed to;   [facility] (Andrea-[phone] or [phone]), No beds   [facility] [phone]) Unable to accept insurance patient has Medicaid   [facility] [phone] due to him unable to pay the deposit.   [facility] ([phone]) Denied due to being positive for Methamphetamine and upcoming court date   [facility] (Ishea-[phone]), left a message for intake nurse to return phone call.

## 2020-02-28 NOTE — ED Notes (Signed)
Pt was given lunch tray.  

## 2020-02-28 NOTE — ED Notes (Signed)
Meal tray placed on bed 

## 2020-02-29 LAB — GLUCOSE, CAPILLARY: Glucose-Capillary: 227 mg/dL — ABNORMAL HIGH (ref 70–99)

## 2020-02-29 MED ORDER — METFORMIN HCL 500 MG PO TABS: 500.0000 mg | ORAL_TABLET | Freq: Two times a day (BID) | ORAL | 0 refills | Status: DC

## 2020-02-29 MED ORDER — ALPRAZOLAM 1 MG PO TABS: 1.0000 mg | ORAL_TABLET | Freq: Every day | ORAL | 0 refills | Status: AC

## 2020-02-29 MED ORDER — GABAPENTIN 300 MG PO CAPS
300.0000 mg | ORAL_CAPSULE | Freq: Three times a day (TID) | ORAL | 0 refills | Status: DC
Start: 2020-02-29 — End: 2021-02-15

## 2020-02-29 MED ORDER — PANTOPRAZOLE SODIUM 40 MG PO TBEC: 40.0000 mg | DELAYED_RELEASE_TABLET | Freq: Every day | ORAL | 0 refills | Status: DC

## 2020-02-29 MED ORDER — LISINOPRIL-HYDROCHLOROTHIAZIDE 20-25 MG PO TABS: 1.0000 | ORAL_TABLET | Freq: Every day | ORAL | 0 refills | Status: DC

## 2020-02-29 MED ORDER — ACETAMINOPHEN 500 MG PO TABS
1000.0000 mg | ORAL_TABLET | Freq: Once | ORAL | Status: DC
  Filled 2020-02-29: qty 2

## 2020-02-29 MED ORDER — GLIPIZIDE 10 MG PO TABS: 20.0000 mg | ORAL_TABLET | Freq: Every day | ORAL | 0 refills | Status: DC

## 2020-02-29 NOTE — ED Notes (Signed)
[facility] detox

## 2020-02-29 NOTE — ED Notes (Signed)
Patient remains sleeping, no signs of distress, will continue to monitor.

## 2020-02-29 NOTE — ED Notes (Addendum)
Patient ate 100% of lunch and beverage. No signs of distress.

## 2020-02-29 NOTE — ED Notes (Signed)
Patient was given tylenol prior to leaving, He was complaining of back pain, Patient was given all of His medications that were obtained from social worker, all of His belongings were given back to him, Nurse talked to Patient about recovery, and He was crying prior to leaving, states " I have not always been this way, I want to stay sober" Nurse told him to take full advantage of the [facility] and its resources, and to work the program for His sobriety. Patient left by taxi, and taxi driver was given the voucher for transport.

## 2020-02-29 NOTE — ED Provider Notes (Signed)
Emergency Medicine Observation Re-evaluation Note  Gregory Duran is a 59 y.o. male, seen on rounds today.  Pt initially presented to the ED for complaints of Alcohol Problem Currently, the patient is sleeping.  Physical Exam  BP 139/90   Pulse 95   Temp 98.3 F (36.8 C) (Oral)   Resp 16   Ht 5\' 10"  (1.778 m)   Wt 108.9 kg   SpO2 100%   BMI 34.44 kg/m  Physical Exam  ED Course / MDM  EKG:    I have reviewed the labs performed to date as well as medications administered while in observation.  Recent changes in the last 24 hours include none. Plan  Current plan is for psych dispo. Patient is not under full IVC at this time.   , Don Perking, MD 02/29/20 [phone]

## 2020-02-29 NOTE — TOC Transition Note (Signed)
Transition of Care [facility]) - CM/SW Discharge Note   Patient Details  Name: Gregory Duran MRN: [id number] Date of Birth: [birth year]  [facility] CM/SW Contact:  Marina Goodell Phone Number: [phone] 02/29/2020, 1:58 PM   Clinical Narrative:     TOC will pick up medications for patient at [facility].  CSW will bring medications to pt w/ taxi voucher and pt will d/c to [facility] [address], [place], [place] [address], [phone].  EDP/EDRN have been notified.        Patient Goals and CMS Choice        Discharge Placement                       Discharge Plan and Services                                     Social Determinants of Health (SDOH) Interventions     Readmission Risk Interventions No flowsheet data found.

## 2020-02-29 NOTE — ED Notes (Signed)
Pt asleep, breakfast tray placed on bed.  

## 2020-02-29 NOTE — TOC Initial Note (Addendum)
Transition of Care [facility]) - Initial/Assessment Note    Patient Details  Name: Gregory Duran MRN: [id number] Date of Birth: Feb 11, 1961  Transition of Care [facility]) CM/SW Contact:    Marina Goodell Phone Number: [phone] 02/29/2020, 11:56 AM  Clinical Narrative:                    This CSW spoke with Ms. Herbie Baltimore RN, and was informed the patient will not be accepted to [facility] without his medications on hand, and once he had his medications, then he was to call [facility] for bed availability.  Ms. Senaida Ores, RN stated that there was nothing on her list which indicated the patient has a bed held for him.  This CSW stated she would call Ms. Senaida Ores, RN once we had everything in order.   This CSW spoke with ED/RN and updated on difficulties obtaining medication for patient and [facility] bed availability.  This CSW emailed ED/RN the application for [facility], along with a resource list of facilities in [place] to give to patient.  ED/RN stated she would speak with EDP about situation.     Patient Goals and CMS Choice        Expected Discharge Plan and Services                                                Prior Living Arrangements/Services                       Activities of Daily Living Home Assistive Devices/Equipment: None ADL Screening (condition at time of admission) Patient's cognitive ability adequate to safely complete daily activities?: Yes Is the patient deaf or have difficulty hearing?: No Does the patient have difficulty seeing, even when wearing glasses/contacts?: No Does the patient have difficulty concentrating, remembering, or making decisions?: No Patient able to express need for assistance with ADLs?: Yes Does the patient have difficulty dressing or bathing?: No Independently performs ADLs?: Yes (appropriate for developmental age) Does the patient have difficulty  walking or climbing stairs?: No Weakness of Legs: None Weakness of Arms/Hands: None  Permission Sought/Granted                  Emotional Assessment              Admission diagnosis:  Detox, back pain via EMS  Patient Active Problem List   Diagnosis Date Noted  . GERD (gastroesophageal reflux disease) 06/29/2019  . Gastroesophageal reflux disease with esophagitis   . Acute blood loss anemia   . Controlled type 2 diabetes mellitus with complication, without long-term current use of insulin (HCC)   . Severe dehydration 11/24/2015  . AKI (acute kidney injury) (HCC) 11/24/2015  . Melena 11/24/2015  . Hyponatremia 11/24/2015  . Leukocytosis 11/24/2015  . GI bleed 11/24/2015  . Chronic hepatitis C (HCC) 05/05/2012  . Hypertension 05/05/2012  . Diabetes mellitus (HCC) 05/05/2012  . Chronic low back pain 05/05/2012  . Obesity 05/05/2012   PCP:  Gareth Morgan, MD Pharmacy:   [facility] [place], [place] - [address] [place] [address] Phone: [phone] Fax: [phone]  [facility] [address] ([address]), [place] - [address] [place] ([place]) [place] [address] Phone:  [phone] Fax: [phone]     Social Determinants of Health (SDOH) Interventions    Readmission Risk Interventions No flowsheet data found.

## 2020-02-29 NOTE — ED Provider Notes (Signed)
Patient has been accepted to [facility].  Requiring full prescriptions during his stay.  Social work has worked with team to obtain funding to pay for his medications which have been sent to [employer].  Patient stable for discharge to [facility].   Willy Eddy, MD 02/29/20 [phone]

## 2020-05-11 ENCOUNTER — Other Ambulatory Visit: Payer: Self-pay

## 2020-05-11 ENCOUNTER — Emergency Department
Admission: EM | Admit: 2020-05-11 | Discharge: 2020-05-11 | Disposition: A | Discharge status: Home / Self Care | Payer: Medicaid Other | Attending: Emergency Medicine | Admitting: Emergency Medicine

## 2020-05-11 DIAGNOSIS — I10 Essential (primary) hypertension: Secondary | ICD-10-CM | POA: Diagnosis not present

## 2020-05-11 DIAGNOSIS — M5442 Lumbago with sciatica, left side: Secondary | ICD-10-CM | POA: Insufficient documentation

## 2020-05-11 DIAGNOSIS — M5441 Lumbago with sciatica, right side: Secondary | ICD-10-CM | POA: Diagnosis not present

## 2020-05-11 DIAGNOSIS — E119 Type 2 diabetes mellitus without complications: Secondary | ICD-10-CM | POA: Diagnosis not present

## 2020-05-11 DIAGNOSIS — Z7984 Long term (current) use of oral hypoglycemic drugs: Secondary | ICD-10-CM | POA: Diagnosis not present

## 2020-05-11 DIAGNOSIS — G8929 Other chronic pain: Secondary | ICD-10-CM

## 2020-05-11 DIAGNOSIS — Z79899 Other long term (current) drug therapy: Secondary | ICD-10-CM | POA: Diagnosis not present

## 2020-05-11 DIAGNOSIS — M545 Low back pain: Secondary | ICD-10-CM | POA: Diagnosis present

## 2020-05-11 MED ORDER — KETOROLAC TROMETHAMINE 30 MG/ML IJ SOLN
30.0000 mg | Freq: Once | INTRAMUSCULAR | Status: AC
  Administered 2020-05-11: 30 mg via INTRAMUSCULAR
  Filled 2020-05-11: qty 1

## 2020-05-11 MED ORDER — HYDROMORPHONE HCL 1 MG/ML IJ SOLN
1.0000 mg | Freq: Once | INTRAMUSCULAR | Status: AC
  Administered 2020-05-11: 1 mg via INTRAVENOUS
  Filled 2020-05-11: qty 1

## 2020-05-11 MED ORDER — ORPHENADRINE CITRATE 30 MG/ML IJ SOLN
60.0000 mg | Freq: Once | INTRAMUSCULAR | Status: AC
  Administered 2020-05-11: 60 mg via INTRAMUSCULAR
  Filled 2020-05-11: qty 2

## 2020-05-11 MED ORDER — METHOCARBAMOL 500 MG PO TABS: 500.0000 mg | ORAL_TABLET | Freq: Four times a day (QID) | ORAL | 2 refills | Status: DC

## 2020-05-11 MED ORDER — PREDNISONE 10 MG PO TABS
10.0000 mg | ORAL_TABLET | Freq: Every day | ORAL | 0 refills | Status: DC
Start: 2020-05-11 — End: 2020-07-18

## 2020-05-11 MED ORDER — DEXAMETHASONE SODIUM PHOSPHATE 10 MG/ML IJ SOLN
10.0000 mg | Freq: Once | INTRAMUSCULAR | Status: AC
  Administered 2020-05-11: 10 mg via INTRAMUSCULAR
  Filled 2020-05-11: qty 1

## 2020-05-11 NOTE — ED Notes (Addendum)
Verified pt had ride home. This RN provided pt w/ phone to call ride for DC. Pt wheeled out front to wait for ride.

## 2020-05-11 NOTE — ED Notes (Signed)
Pt presents w/ chronic back pain. Pt states pain is in lower back and radiates up sides of abdomen and sides of spine. Pt also reports bilateral leg weakness. [facility] and has hx of scoliosis.

## 2020-05-11 NOTE — ED Notes (Signed)
See triage note  Presents increased back pain  Has been seen by [facility]  Placed on oxycodone  States meds were working but pain has increased

## 2020-05-11 NOTE — ED Provider Notes (Signed)
[facility] Emergency Department Provider Note  ____________________________________________  Time seen: Approximately 5:39 PM  I have reviewed the triage vital signs and the nursing notes.   HISTORY  Chief Complaint Back Pain    HPI Gregory Duran is a 59 y.o. male who presents the emergency department complaining of increased chronic lower back pain.  Patient states that he has a history of degenerative disc disease, scoliosis, sciatica.  Patient states that he has a Careers adviser that is following his case, they recommended epidural injections and ultimately surgery.  Patient is going to this process but states that over the past several days the pain has become so severe he cannot tolerate it anymore.  Patient has pain medication from his surgeon and is not requesting a narcotic.  He states that he typically receives a couple of shots which columns his pain down and he can manage it as an outpatient.  No concerning symptoms of paresthesias.  No bowel or bladder dysfunction, saddle anesthesia.  No recent trauma.  No urinary or GI complaints.         Past Medical History:  Diagnosis Date  . Chronic back pain   . Chronic shoulder pain   . Diabetes mellitus (HCC)   . Hepatitis C    Patient states that he does not have hep C.  Will bring test results.  . Hypertension   . Sciatica     Patient Active Problem List   Diagnosis Date Noted  . GERD (gastroesophageal reflux disease) 06/29/2019  . Gastroesophageal reflux disease with esophagitis   . Acute blood loss anemia   . Controlled type 2 diabetes mellitus with complication, without long-term current use of insulin (HCC)   . Severe dehydration 11/24/2015  . AKI (acute kidney injury) (HCC) 11/24/2015  . Melena 11/24/2015  . Hyponatremia 11/24/2015  . Leukocytosis 11/24/2015  . GI bleed 11/24/2015  . Chronic hepatitis C (HCC) 05/05/2012  . Hypertension 05/05/2012  . Diabetes mellitus (HCC) 05/05/2012  .  Chronic low back pain 05/05/2012  . Obesity 05/05/2012    Past Surgical History:  Procedure Laterality Date  . ESOPHAGOGASTRODUODENOSCOPY (EGD) WITH PROPOFOL N/A 11/25/2015   Procedure: ESOPHAGOGASTRODUODENOSCOPY (EGD) WITH PROPOFOL;  Surgeon: Malissa Hippo, MD;  Location: [facility];  Service: Endoscopy;  Laterality: N/A;  . KIDNEY STONE SURGERY      Prior to Admission medications   Medication Sig Start Date End Date Taking? Authorizing Provider  gabapentin (NEURONTIN) 300 MG capsule TAKE 1 CAPSULE BY MOUTH AT BEDTIME FOR 5 DAYS THEN INCREASE TO 1 CAPSULE TWICE DAILY FOR 5 DAYS THEN INCREASE TO 1 CAPSULE THREE TIMES DAILY 02/04/19   [provider]  gabapentin (NEURONTIN) 300 MG capsule Take 1 capsule (300 mg total) by mouth 3 (three) times daily for 5 days. 02/29/20 03/05/20  Willy Eddy, MD  glipiZIDE (GLUCOTROL) 10 MG tablet Take 2 tablets (20 mg total) by mouth daily for 5 days. 02/29/20 03/05/20  Willy Eddy, MD  lisinopril-hydrochlorothiazide (ZESTORETIC) 20-25 MG tablet Take 1 tablet by mouth daily for 5 days. 02/29/20 03/05/20  Willy Eddy, MD  metFORMIN (GLUCOPHAGE) 500 MG tablet Take 1 tablet (500 mg total) by mouth 2 (two) times daily with a meal for 5 days. 02/29/20 03/05/20  Willy Eddy, MD  methocarbamol (ROBAXIN) 500 MG tablet Take 1 tablet (500 mg total) by mouth 4 (four) times daily. 05/11/20   Annel Zunker, Delorise Royals, PA-C  pantoprazole (PROTONIX) 40 MG tablet Take 1 tablet (40 mg total) by  mouth daily before breakfast for 5 days. 02/29/20 03/05/20  Willy Eddy, MD  predniSONE (DELTASONE) 10 MG tablet Take 1 tablet (10 mg total) by mouth daily. 05/11/20   Jezlyn Westerfield, Delorise Royals, PA-C    Allergies Fish allergy  Family History  Problem Relation Age of Onset  . Heart disease Mother   . Heart disease Father   . Healthy Sister   . Hypertension Brother   . Hypertension Brother   . Hypertension Brother   . Diabetes Brother     Social  History Social History   Tobacco Use  . Smoking status: Never Smoker  . Smokeless tobacco: Never Used  Vaping Use  . Vaping Use: Never used  Substance Use Topics  . Alcohol use: Yes    Comment: heavily; 2 beers today per pt  . Drug use: No     Review of Systems  Constitutional: No fever/chills Eyes: No visual changes. No discharge ENT: No upper respiratory complaints. Cardiovascular: no chest pain. Respiratory: no cough. No SOB. Gastrointestinal: No abdominal pain.  No nausea, no vomiting.  No diarrhea.  No constipation. Musculoskeletal: Mid and lower back pain, acute on chronic Skin: Negative for rash, abrasions, lacerations, ecchymosis. Neurological: Negative for headaches, focal weakness or numbness. 10-point ROS otherwise negative.  ____________________________________________   PHYSICAL EXAM:  VITAL SIGNS: ED Triage Vitals  Enc Vitals Group     BP 05/11/20 1519 105/62     Pulse Rate 05/11/20 1519 67     Resp 05/11/20 1519 18     Temp 05/11/20 1519 97.9 F (36.6 C)     Temp Source 05/11/20 1519 Oral     SpO2 05/11/20 1519 100 %     Weight 05/11/20 1520 250 lb (113.4 kg)     Height 05/11/20 1520 5\' 10"  (1.778 m)     Head Circumference --      Peak Flow --      Pain Score 05/11/20 1519 8     Pain Loc --      Pain Edu? --      Excl. in GC? --      Constitutional: Alert and oriented. Well appearing and in no acute distress. Eyes: Conjunctivae are normal. PERRL. EOMI. Head: Atraumatic. ENT:      Ears:       Nose: No congestion/rhinnorhea.      Mouth/Throat: Mucous membranes are moist.  Neck: No stridor.    Cardiovascular: Normal rate, regular rhythm. Normal S1 and S2.  Good peripheral circulation. Respiratory: Normal respiratory effort without tachypnea or retractions. Lungs CTAB. Good air entry to the bases with no decreased or absent breath sounds. Gastrointestinal: Bowel sounds 4 quadrants. Soft and nontender to palpation. No guarding or rigidity. No  palpable masses. No distention. No CVA tenderness. Musculoskeletal: Full range of motion to all extremities. No gross deformities appreciated.  No visible abnormalities.  Diffuse tenderness throughout the lower thoracic and lumbar spine.  No palpable abnormality or step-off.  Tenderness to palpation over bilateral sciatic notches but negative straight leg raise bilaterally.  Dorsalis pedis pulses sensation intact bilateral lower extremities. Neurologic:  Normal speech and language. No gross focal neurologic deficits are appreciated.  Skin:  Skin is warm, dry and intact. No rash noted. Psychiatric: Mood and affect are normal. Speech and behavior are normal. Patient exhibits appropriate insight and judgement.   ____________________________________________   LABS (all labs ordered are listed, but only abnormal results are displayed)  Labs Reviewed - No data to display ____________________________________________  EKG  ____________________________________________  RADIOLOGY   No results found.  ____________________________________________    PROCEDURES  Procedure(s) performed:    Procedures    Medications  dexamethasone (DECADRON) injection 10 mg (10 mg Intramuscular Given 05/11/20 1833)  orphenadrine (NORFLEX) injection 60 mg (60 mg Intramuscular Given 05/11/20 1833)  HYDROmorphone (DILAUDID) injection 1 mg (1 mg Intravenous Given 05/11/20 1834)  ketorolac (TORADOL) 30 MG/ML injection 30 mg (30 mg Intramuscular Given 05/11/20 1833)     ____________________________________________   INITIAL IMPRESSION / ASSESSMENT AND PLAN / ED COURSE  Pertinent labs & imaging results that were available during my care of the patient were reviewed by me and considered in my medical decision making (see chart for details).  Review of the [place] CSRS was performed in accordance of the [facility] prior to dispensing any controlled drugs.           Patient's diagnosis is consistent with chronic  lower back pain.  Patient presented to emergency department requesting "shots" to help with his acute on chronic back pain.  Patient states that he has flares every so often, has a surgeon that is planning on epidural injections and surgery.  Patient states that he is just requesting some temporary relief until he can see a Careers adviser.  Patient is requesting that I not prescribe any narcotics as he has pain medication from his surgeon.  Patient is requesting shots here in the emergency department discharge.  Exam is reassuring.  No indication for labs or imaging.  Patient is given Toradol, Norflex, Decadron and Dilaudid injections.. Patient will be discharged home with prescriptions for prednisone taper and muscle relaxer. Patient is to follow up with orthopedic surgery as needed or otherwise directed. Patient is given ED precautions to return to the ED for any worsening or new symptoms.     ____________________________________________  FINAL CLINICAL IMPRESSION(S) / ED DIAGNOSES  Final diagnoses:  Chronic midline low back pain with bilateral sciatica      NEW MEDICATIONS STARTED DURING THIS VISIT:  ED Discharge Orders         Ordered    predniSONE (DELTASONE) 10 MG tablet  Daily     Discontinue  Reprint    Note to Pharmacy: Take 6 pills x 2 days, 5 pills x 2 days, 4 pills x 2 days, 3 pills x 2 days, 2 pills x 2 days, and 1 pill x 2 days   05/11/20 1827    methocarbamol (ROBAXIN) 500 MG tablet  4 times daily     Discontinue  Reprint     05/11/20 1827              This chart was dictated using voice recognition software/Dragon. Despite best efforts to proofread, errors can occur which can change the meaning. Any change was purely unintentional.    Racheal Patches, PA-C 05/11/20 1857    Arnaldo Natal, MD 05/11/20 2035

## 2020-05-11 NOTE — ED Triage Notes (Signed)
Pt comes with back pain from scoliosis. Increasing pain for a few weeks. States today the pain is unbearable.

## 2020-05-20 ENCOUNTER — Other Ambulatory Visit: Payer: Self-pay | Admitting: Physical Medicine and Rehabilitation

## 2020-05-20 DIAGNOSIS — M5416 Radiculopathy, lumbar region: Secondary | ICD-10-CM

## 2020-06-08 ENCOUNTER — Ambulatory Visit
Admission: RE | Admit: 2020-06-08 | Discharge: 2020-06-08 | Disposition: A | Discharge status: Home / Self Care | Payer: Medicaid Other | Source: Ambulatory Visit | Attending: Physical Medicine and Rehabilitation | Admitting: Physical Medicine and Rehabilitation

## 2020-06-08 ENCOUNTER — Other Ambulatory Visit: Payer: Self-pay

## 2020-06-08 DIAGNOSIS — M5416 Radiculopathy, lumbar region: Secondary | ICD-10-CM | POA: Diagnosis present

## 2020-07-18 ENCOUNTER — Encounter (INDEPENDENT_AMBULATORY_CARE_PROVIDER_SITE_OTHER): Payer: Self-pay | Admitting: *Deleted

## 2020-07-18 ENCOUNTER — Encounter (INDEPENDENT_AMBULATORY_CARE_PROVIDER_SITE_OTHER): Payer: Self-pay | Admitting: Gastroenterology

## 2020-07-18 ENCOUNTER — Ambulatory Visit (INDEPENDENT_AMBULATORY_CARE_PROVIDER_SITE_OTHER): Payer: Medicaid Other | Admitting: Gastroenterology

## 2020-07-18 ENCOUNTER — Other Ambulatory Visit (INDEPENDENT_AMBULATORY_CARE_PROVIDER_SITE_OTHER): Payer: Self-pay | Admitting: *Deleted

## 2020-07-18 ENCOUNTER — Encounter (INDEPENDENT_AMBULATORY_CARE_PROVIDER_SITE_OTHER): Payer: Self-pay

## 2020-07-18 ENCOUNTER — Telehealth (INDEPENDENT_AMBULATORY_CARE_PROVIDER_SITE_OTHER): Payer: Self-pay | Admitting: *Deleted

## 2020-07-18 ENCOUNTER — Other Ambulatory Visit: Payer: Self-pay

## 2020-07-18 DIAGNOSIS — Z8619 Personal history of other infectious and parasitic diseases: Secondary | ICD-10-CM

## 2020-07-18 DIAGNOSIS — R1902 Left upper quadrant abdominal swelling, mass and lump: Secondary | ICD-10-CM

## 2020-07-18 DIAGNOSIS — H938X3 Other specified disorders of ear, bilateral: Secondary | ICD-10-CM

## 2020-07-18 DIAGNOSIS — R1900 Intra-abdominal and pelvic swelling, mass and lump, unspecified site: Secondary | ICD-10-CM | POA: Insufficient documentation

## 2020-07-18 MED ORDER — PLENVU 140 G PO SOLR: 1.0000 | Freq: Once | ORAL | 0 refills | Status: AC

## 2020-07-18 NOTE — Patient Instructions (Addendum)
Schedule CT abdomen/pelvis with IV contrast Perform blood workup Follow up with ENT for evaluation of ear fullness Schedule colonoscopy

## 2020-07-18 NOTE — H&P (View-Only) (Signed)
Gregory Duran, M.D. Gastroenterology & Hepatology [facility] [address] [place], [place] [address]  Primary Care Physician: Patient, No Pcp Per No address on file  I will communicate my assessment and recommendations to the referring MD via EMR. "Note: Occasional unusual wording and randomly placed punctuation marks may result from the use of speech recognition technology to transcribe this document"  Problems: 1. Possible left upper quadrant abdominal mass 2. History of hepatitis C status post treatment with Epclusa 3. F2 predominant/F3 fibrosis  History of Present Illness: Gregory Duran is a 59 y.o. male with past medical history of diabetes, hepatitis C status post treatment with Epclusa who presents for evaluation of new onset left upper quadrant abdominal mass.  Patient states that 1 month ago he noticed his left lower quadrant was more distended than usual and was asymmetric compared to his right side.  He never had the symptoms in the past.  He stated that he has also felt some pain in the left side of his back which is constant in nature and is described as a dull pain.  He states that within the last month he has been passing blood in his urine intermittently the most of it has been for 3 days.  This happened last week.  He also reports that he lost 25 pounds last year but he states that his weight has been stable since then.  Regarding his hepatitis C treatment, the patient received treatment with Epclusa for 12 weeks compliantly.  He had a recheck of his viral load on 06/30/2019 which was negative for any significant increase in his viral load.  The patient denies having any nausea, vomiting, fever, chills, hematochezia, melena, hematemesis, diarrhea, jaundice, pruritus.  Patient complained of some ear fullness and will like to be referred to ENT for further evaluation.  Last EGD: Never Last Colonoscopy: Never  FHx:  neg for any gastrointestinal/liver disease, brother had throat cancer Social: neg smoking, alcohol or illicit drug use Surgical: non contributory  Past Medical History: Past Medical History:  Diagnosis Date  . Chronic back pain   . Chronic shoulder pain   . Diabetes mellitus (HCC)   . Hepatitis C    Patient states that he does not have hep C.  Will bring test results.  . Hypertension   . Sciatica     Past Surgical History: Past Surgical History:  Procedure Laterality Date  . ESOPHAGOGASTRODUODENOSCOPY (EGD) WITH PROPOFOL N/A 11/25/2015   Procedure: ESOPHAGOGASTRODUODENOSCOPY (EGD) WITH PROPOFOL;  Surgeon: Malissa Hippo, MD;  Location[facility];  Service: Endoscopy;  Laterality: N/A;  . KIDNEY STONE SURGERY      Family History: Family History  Problem Relation Age of Onset  . Heart disease Mother   . Heart disease Father   . Healthy Sister   . Hypertension Brother   . Hypertension Brother   . Hypertension Brother   . Diabetes Brother     Social History: Social History   Tobacco Use  Smoking Status Never Smoker  Smokeless Tobacco Never Used   Social History   Substance and Sexual Activity  Alcohol Use Not Currently   Social History   Substance and Sexual Activity  Drug Use No    Allergies: Allergies  Allergen Reactions  . Fish Allergy Nausea And Vomiting    Medications: Current Outpatient Medications  Medication Sig Dispense Refill  . gabapentin (NEURONTIN) 300 MG capsule TAKE 1 CAPSULE BY MOUTH AT BEDTIME FOR 5  DAYS THEN INCREASE TO 1 CAPSULE TWICE DAILY FOR 5 DAYS THEN INCREASE TO 1 CAPSULE THREE TIMES DAILY    . glipiZIDE (GLUCOTROL) 10 MG tablet Take 10 mg by mouth daily before breakfast.    . lisinopril-hydrochlorothiazide (ZESTORETIC) 20-25 MG tablet Take 1 tablet by mouth in the morning and at bedtime.    . metFORMIN (GLUCOPHAGE) 500 MG tablet Take 1,000 mg by mouth 2 (two) times daily with a meal.    . oxycodone (OXY-IR) 5 MG capsule Take  5 mg by mouth every 4 (four) hours as needed.    . gabapentin (NEURONTIN) 300 MG capsule Take 1 capsule (300 mg total) by mouth 3 (three) times daily for 5 days. 15 capsule 0  . glipiZIDE (GLUCOTROL) 10 MG tablet Take 2 tablets (20 mg total) by mouth daily for 5 days. 10 tablet 0  . lisinopril-hydrochlorothiazide (ZESTORETIC) 20-25 MG tablet Take 1 tablet by mouth daily for 5 days. 5 tablet 0  . metFORMIN (GLUCOPHAGE) 500 MG tablet Take 1 tablet (500 mg total) by mouth 2 (two) times daily with a meal for 5 days. 10 tablet 0  . methocarbamol (ROBAXIN) 500 MG tablet Take 1 tablet (500 mg total) by mouth 4 (four) times daily. (Patient not taking: Reported on 07/18/2020) 16 tablet 2  . pantoprazole (PROTONIX) 40 MG tablet Take 1 tablet (40 mg total) by mouth daily before breakfast for 5 days. 5 tablet 0   No current facility-administered medications for this visit.    Review of Systems: GENERAL: negative for malaise, night sweats HEENT: No changes in hearing or vision, no nose bleeds or other nasal problems. NECK: Negative for lumps, goiter, pain and significant neck swelling RESPIRATORY: Negative for cough, wheezing CARDIOVASCULAR: Negative for chest pain, leg swelling, palpitations, orthopnea GI: SEE HPI MUSCULOSKELETAL: Negative for joint pain or swelling, back pain, and muscle pain. SKIN: Negative for lesions, rash PSYCH: Negative for sleep disturbance, mood disorder and recent psychosocial stressors. HEMATOLOGY Negative for prolonged bleeding, bruising easily, and swollen nodes. ENDOCRINE: Negative for cold or heat intolerance, polyuria, polydipsia and goiter. NEURO: negative for tremor, gait imbalance, syncope and seizures. The remainder of the review of systems is noncontributory.   Physical Exam: BP 123/80 (BP Location: Right Arm, Patient Position: Sitting, Cuff Size: Normal)   Pulse 71   Temp 97.9 F (36.6 C) (Oral)   Ht 5\' 10"  (1.778 m)   Wt 220 lb 14.4 oz (100.2 kg)   BMI  31.70 kg/m  GENERAL: The patient is AO x3, in no acute distress. HEENT: Head is normocephalic and atraumatic. EOMI are intact. Mouth is well hydrated and without lesions. NECK: Supple. No masses LUNGS: Clear to auscultation. No presence of rhonchi/wheezing/rales. Adequate chest expansion HEART: RRR, normal s1 and s2. ABDOMEN: Has abdominal distention in the left upper quadrant. A hard mass cannot be palpated but it is distended compared to the R side. This are is mildly tender to palpation. no guarding, no peritoneal signs, and nondistended. BS +. No masses. EXTREMITIES: Without any cyanosis, clubbing, rash, lesions or edema. NEUROLOGIC: AOx3, no focal motor deficit. SKIN: no jaundice, no rashes  Imaging/Labs: as above  I personally reviewed and interpreted the available labs, imaging and endoscopic files.  Impression and Plan: Gregory Duran is a 59 y.o. male with past medical history of diabetes, hepatitis C status post treatment with Epclusa who presents for evaluation of new onset possible left upper quadrant abdominal mass and distention.  In terms of his new complaint, the  patient had new onset of symptoms with associated distention in the physical exam.  I did not feel the presence of any hard mass but it is asymmetric in physical exam compared to her right side.  The distention could be coming from his colon, less likely from the spleen or stomach.  However, better evaluation should be performed with a CT scan of the abdomen and pelvis with IV contrast.  Patient is at average risk for colorectal cancer but has never had a colonoscopy in the past, will schedule screening colonoscopy.  Finally, regarding his hepatitis C, the patient had blood testing that was negative after treatment, we will recheck a viral load at this time and if negative we will order a repeat elastography to assess if significant fibrosis that warrants HCC surveillance.  Patient understood and agreed.  I would refer  him to ENT due to ear fullness that has not improved with cleaning.  - Check CBC, CMP and HCV RNA - Liver elastography once HCV RNA is back -  Schedule CT abdomen/pelvis with IV contrast - Schedule screening colonoscopy - ENT referral  All questions were answered.      Dolores Frame, MD Gastroenterology and Hepatology [facility]

## 2020-07-18 NOTE — Telephone Encounter (Signed)
Patient needs Plenvu   

## 2020-07-18 NOTE — Progress Notes (Signed)
[name], M.D. Gastroenterology & Hepatology [facility] [address] [place], [place] [address]  Primary Care Physician: Patient, No Pcp Per No address on file  I will communicate my assessment and recommendations to the referring MD via EMR. "Note: Occasional unusual wording and randomly placed punctuation marks may result from the use of speech recognition technology to transcribe this document"  Problems: 1. Possible left upper quadrant abdominal mass 2. History of hepatitis C status post treatment with Epclusa 3. F2 predominant/F3 fibrosis  History of Present Illness: MAISEN KLINGLER is a 59 y.o. male with past medical history of diabetes, hepatitis C status post treatment with Epclusa who presents for evaluation of new onset left upper quadrant abdominal mass.  Patient states that 1 month ago he noticed his left lower quadrant was more distended than usual and was asymmetric compared to his right side.  He never had the symptoms in the past.  He stated that he has also felt some pain in the left side of his back which is constant in nature and is described as a dull pain.  He states that within the last month he has been passing blood in his urine intermittently the most of it has been for 3 days.  This happened last week.  He also reports that he lost 25 pounds last year but he states that his weight has been stable since then.  Regarding his hepatitis C treatment, the patient received treatment with Epclusa for 12 weeks compliantly.  He had a recheck of his viral load on 06/30/2019 which was negative for any significant increase in his viral load.  The patient denies having any nausea, vomiting, fever, chills, hematochezia, melena, hematemesis, diarrhea, jaundice, pruritus.  Patient complained of some ear fullness and will like to be referred to ENT for further evaluation.  Last EGD: Never Last Colonoscopy: Never  FHx:  neg for any gastrointestinal/liver disease, brother had throat cancer Social: neg smoking, alcohol or illicit drug use Surgical: non contributory  Past Medical History: Past Medical History:  Diagnosis Date  . Chronic back pain   . Chronic shoulder pain   . Diabetes mellitus (HCC)   . Hepatitis C    Patient states that he does not have hep C.  Will bring test results.  . Hypertension   . Sciatica     Past Surgical History: Past Surgical History:  Procedure Laterality Date  . ESOPHAGOGASTRODUODENOSCOPY (EGD) WITH PROPOFOL N/A 11/25/2015   Procedure: ESOPHAGOGASTRODUODENOSCOPY (EGD) WITH PROPOFOL;  Surgeon: Malissa Hippo, MD;  Location: [facility];  Service: Endoscopy;  Laterality: N/A;  . KIDNEY STONE SURGERY      Family History: Family History  Problem Relation Age of Onset  . Heart disease Mother   . Heart disease Father   . Healthy Sister   . Hypertension Brother   . Hypertension Brother   . Hypertension Brother   . Diabetes Brother     Social History: Social History   Tobacco Use  Smoking Status Never Smoker  Smokeless Tobacco Never Used   Social History   Substance and Sexual Activity  Alcohol Use Not Currently   Social History   Substance and Sexual Activity  Drug Use No    Allergies: Allergies  Allergen Reactions  . Fish Allergy Nausea And Vomiting    Medications: Current Outpatient Medications  Medication Sig Dispense Refill  . gabapentin (NEURONTIN) 300 MG capsule TAKE 1 CAPSULE BY MOUTH AT BEDTIME FOR 5  DAYS THEN INCREASE TO 1 CAPSULE TWICE DAILY FOR 5 DAYS THEN INCREASE TO 1 CAPSULE THREE TIMES DAILY    . glipiZIDE (GLUCOTROL) 10 MG tablet Take 10 mg by mouth daily before breakfast.    . lisinopril-hydrochlorothiazide (ZESTORETIC) 20-25 MG tablet Take 1 tablet by mouth in the morning and at bedtime.    . metFORMIN (GLUCOPHAGE) 500 MG tablet Take 1,000 mg by mouth 2 (two) times daily with a meal.    . oxycodone (OXY-IR) 5 MG capsule Take  5 mg by mouth every 4 (four) hours as needed.    . gabapentin (NEURONTIN) 300 MG capsule Take 1 capsule (300 mg total) by mouth 3 (three) times daily for 5 days. 15 capsule 0  . glipiZIDE (GLUCOTROL) 10 MG tablet Take 2 tablets (20 mg total) by mouth daily for 5 days. 10 tablet 0  . lisinopril-hydrochlorothiazide (ZESTORETIC) 20-25 MG tablet Take 1 tablet by mouth daily for 5 days. 5 tablet 0  . metFORMIN (GLUCOPHAGE) 500 MG tablet Take 1 tablet (500 mg total) by mouth 2 (two) times daily with a meal for 5 days. 10 tablet 0  . methocarbamol (ROBAXIN) 500 MG tablet Take 1 tablet (500 mg total) by mouth 4 (four) times daily. (Patient not taking: Reported on 07/18/2020) 16 tablet 2  . pantoprazole (PROTONIX) 40 MG tablet Take 1 tablet (40 mg total) by mouth daily before breakfast for 5 days. 5 tablet 0   No current facility-administered medications for this visit.    Review of Systems: GENERAL: negative for malaise, night sweats HEENT: No changes in hearing or vision, no nose bleeds or other nasal problems. NECK: Negative for lumps, goiter, pain and significant neck swelling RESPIRATORY: Negative for cough, wheezing CARDIOVASCULAR: Negative for chest pain, leg swelling, palpitations, orthopnea GI: SEE HPI MUSCULOSKELETAL: Negative for joint pain or swelling, back pain, and muscle pain. SKIN: Negative for lesions, rash PSYCH: Negative for sleep disturbance, mood disorder and recent psychosocial stressors. HEMATOLOGY Negative for prolonged bleeding, bruising easily, and swollen nodes. ENDOCRINE: Negative for cold or heat intolerance, polyuria, polydipsia and goiter. NEURO: negative for tremor, gait imbalance, syncope and seizures. The remainder of the review of systems is noncontributory.   Physical Exam: BP 123/80 (BP Location: Right Arm, Patient Position: Sitting, Cuff Size: Normal)   Pulse 71   Temp 97.9 F (36.6 C) (Oral)   Ht 5\' 10"  (1.778 m)   Wt 220 lb 14.4 oz (100.2 kg)   BMI  31.70 kg/m  GENERAL: The patient is AO x3, in no acute distress. HEENT: Head is normocephalic and atraumatic. EOMI are intact. Mouth is well hydrated and without lesions. NECK: Supple. No masses LUNGS: Clear to auscultation. No presence of rhonchi/wheezing/rales. Adequate chest expansion HEART: RRR, normal s1 and s2. ABDOMEN: Has abdominal distention in the left upper quadrant. A hard mass cannot be palpated but it is distended compared to the R side. This are is mildly tender to palpation. no guarding, no peritoneal signs, and nondistended. BS +. No masses. EXTREMITIES: Without any cyanosis, clubbing, rash, lesions or edema. NEUROLOGIC: AOx3, no focal motor deficit. SKIN: no jaundice, no rashes  Imaging/Labs: as above  I personally reviewed and interpreted the available labs, imaging and endoscopic files.  Impression and Plan: FROYLAN HOBBY is a 59 y.o. male with past medical history of diabetes, hepatitis C status post treatment with Epclusa who presents for evaluation of new onset possible left upper quadrant abdominal mass and distention.  In terms of his new complaint, the  patient had new onset of symptoms with associated distention in the physical exam.  I did not feel the presence of any hard mass but it is asymmetric in physical exam compared to her right side.  The distention could be coming from his colon, less likely from the spleen or stomach.  However, better evaluation should be performed with a CT scan of the abdomen and pelvis with IV contrast.  Patient is at average risk for colorectal cancer but has never had a colonoscopy in the past, will schedule screening colonoscopy.  Finally, regarding his hepatitis C, the patient had blood testing that was negative after treatment, we will recheck a viral load at this time and if negative we will order a repeat elastography to assess if significant fibrosis that warrants HCC surveillance.  Patient understood and agreed.  I would refer  him to ENT due to ear fullness that has not improved with cleaning.  - Check CBC, CMP and HCV RNA - Liver elastography once HCV RNA is back -  Schedule CT abdomen/pelvis with IV contrast - Schedule screening colonoscopy - ENT referral  All questions were answered.      Dolores Frame, MD Gastroenterology and Hepatology [facility]

## 2020-07-20 LAB — COMPREHENSIVE METABOLIC PANEL
AG Ratio: 1.7 (calc) (ref 1.0–2.5)
ALT: 17 U/L (ref 9–46)
AST: 18 U/L (ref 10–35)
Albumin: 4.2 g/dL (ref 3.6–5.1)
Alkaline phosphatase (APISO): 46 U/L (ref 35–144)
BUN: 14 mg/dL (ref 7–25)
CO2: 27 mmol/L (ref 20–32)
Calcium: 9.5 mg/dL (ref 8.6–10.3)
Chloride: 102 mmol/L (ref 98–110)
Creat: 0.73 mg/dL (ref 0.70–1.33)
Globulin: 2.5 g/dL (calc) (ref 1.9–3.7)
Glucose, Bld: 86 mg/dL (ref 65–139)
Potassium: 3.8 mmol/L (ref 3.5–5.3)
Sodium: 139 mmol/L (ref 135–146)
Total Bilirubin: 0.5 mg/dL (ref 0.2–1.2)
Total Protein: 6.7 g/dL (ref 6.1–8.1)

## 2020-07-20 LAB — CBC WITH DIFFERENTIAL/PLATELET
Absolute Monocytes: 796 cells/uL (ref 200–950)
Basophils Absolute: 31 cells/uL (ref 0–200)
Basophils Relative: 0.4 %
Eosinophils Absolute: 133 cells/uL (ref 15–500)
Eosinophils Relative: 1.7 %
HCT: 38.5 % (ref 38.5–50.0)
Hemoglobin: 12.9 g/dL — ABNORMAL LOW (ref 13.2–17.1)
Lymphs Abs: 2582 cells/uL (ref 850–3900)
MCH: 30.8 pg (ref 27.0–33.0)
MCHC: 33.5 g/dL (ref 32.0–36.0)
MCV: 91.9 fL (ref 80.0–100.0)
MPV: 10.9 fL (ref 7.5–12.5)
Monocytes Relative: 10.2 %
Neutro Abs: 4259 cells/uL (ref 1500–7800)
Neutrophils Relative %: 54.6 %
Platelets: 190 10*3/uL (ref 140–400)
RBC: 4.19 10*6/uL — ABNORMAL LOW (ref 4.20–5.80)
RDW: 12.4 % (ref 11.0–15.0)
Total Lymphocyte: 33.1 %
WBC: 7.8 10*3/uL (ref 3.8–10.8)

## 2020-07-20 LAB — HEPATITIS C RNA QUANTITATIVE
HCV RNA, PCR, QN (Log): <1.18 log IU/mL
HCV RNA, PCR, QN: <15 IU/mL

## 2020-07-21 ENCOUNTER — Telehealth (INDEPENDENT_AMBULATORY_CARE_PROVIDER_SITE_OTHER): Payer: Self-pay | Admitting: *Deleted

## 2020-07-21 NOTE — Telephone Encounter (Signed)
I rec'd a fax from [facility] - patient will have to be referred to them by his PCP - due to his insurance - they will not accept referral from our office - I called patient and left detailed message for him

## 2020-07-21 NOTE — Telephone Encounter (Signed)
Thanks

## 2020-07-28 ENCOUNTER — Other Ambulatory Visit: Payer: Self-pay

## 2020-07-28 ENCOUNTER — Ambulatory Visit
Admission: RE | Admit: 2020-07-28 | Discharge: 2020-07-28 | Disposition: A | Discharge status: Home / Self Care | Payer: Medicaid Other | Source: Ambulatory Visit | Attending: Gastroenterology | Admitting: Gastroenterology

## 2020-07-28 ENCOUNTER — Other Ambulatory Visit (INDEPENDENT_AMBULATORY_CARE_PROVIDER_SITE_OTHER): Payer: Self-pay | Admitting: Gastroenterology

## 2020-07-28 DIAGNOSIS — K7460 Unspecified cirrhosis of liver: Secondary | ICD-10-CM

## 2020-07-28 DIAGNOSIS — R1902 Left upper quadrant abdominal swelling, mass and lump: Secondary | ICD-10-CM | POA: Diagnosis present

## 2020-07-28 DIAGNOSIS — R1012 Left upper quadrant pain: Secondary | ICD-10-CM

## 2020-07-28 MED ORDER — IOHEXOL 300 MG/ML  SOLN
100.0000 mL | Freq: Once | INTRAMUSCULAR | Status: AC | PRN
  Administered 2020-07-28: 100 mL via INTRAVENOUS

## 2020-07-28 MED ORDER — DICYCLOMINE HCL 10 MG PO CAPS: 10.0000 mg | ORAL_CAPSULE | Freq: Two times a day (BID) | ORAL | 5 refills | Status: DC

## 2020-07-28 NOTE — Progress Notes (Signed)
elastography at [facility]

## 2020-07-29 NOTE — Progress Notes (Signed)
[place] sch'd 08/16/20 at 8 am (745) at [place], npo after midnight, patient aware

## 2020-08-09 ENCOUNTER — Other Ambulatory Visit (HOSPITAL_COMMUNITY)
Admission: RE | Admit: 2020-08-09 | Discharge: 2020-08-09 | Disposition: A | Discharge status: Home / Self Care | Payer: Medicaid Other | Source: Ambulatory Visit | Attending: Gastroenterology | Admitting: Gastroenterology

## 2020-08-09 ENCOUNTER — Other Ambulatory Visit: Payer: Self-pay

## 2020-08-09 DIAGNOSIS — Z01812 Encounter for preprocedural laboratory examination: Secondary | ICD-10-CM | POA: Diagnosis present

## 2020-08-09 DIAGNOSIS — Z20822 Contact with and (suspected) exposure to covid-19: Secondary | ICD-10-CM | POA: Insufficient documentation

## 2020-08-09 LAB — BASIC METABOLIC PANEL
Anion gap: 14 (ref 5–15)
BUN: 21 mg/dL — ABNORMAL HIGH (ref 6–20)
CO2: 22 mmol/L (ref 22–32)
Calcium: 9.7 mg/dL (ref 8.9–10.3)
Chloride: 100 mmol/L (ref 98–111)
Creatinine, Ser: 0.82 mg/dL (ref 0.61–1.24)
GFR, Estimated: >60 mL/min (ref >60)
Glucose, Bld: 144 mg/dL — ABNORMAL HIGH (ref 70–99)
Potassium: 5.1 mmol/L (ref 3.5–5.1)
Sodium: 136 mmol/L (ref 135–145)

## 2020-08-09 LAB — SARS CORONAVIRUS 2 (TAT 6-24 HRS): SARS Coronavirus 2: NEGATIVE

## 2020-08-10 ENCOUNTER — Ambulatory Visit (HOSPITAL_COMMUNITY)
Admission: RE | Admit: 2020-08-10 | Discharge: 2020-08-10 | Disposition: A | Discharge status: Home / Self Care | Payer: Medicaid Other | Attending: Gastroenterology | Admitting: Gastroenterology

## 2020-08-10 ENCOUNTER — Ambulatory Visit (HOSPITAL_COMMUNITY): Payer: Medicaid Other | Admitting: Anesthesiology

## 2020-08-10 ENCOUNTER — Encounter (HOSPITAL_COMMUNITY): Payer: Self-pay | Admitting: Gastroenterology

## 2020-08-10 ENCOUNTER — Other Ambulatory Visit: Payer: Self-pay

## 2020-08-10 ENCOUNTER — Encounter (HOSPITAL_COMMUNITY)
Admission: RE | Disposition: A | Discharge status: Home / Self Care | Payer: Self-pay | Source: Home / Self Care | Attending: Gastroenterology

## 2020-08-10 DIAGNOSIS — Z1211 Encounter for screening for malignant neoplasm of colon: Secondary | ICD-10-CM | POA: Insufficient documentation

## 2020-08-10 HISTORY — PX: COLONOSCOPY WITH PROPOFOL: SHX5780

## 2020-08-10 LAB — GLUCOSE, CAPILLARY: Glucose-Capillary: 77 mg/dL (ref 70–99)

## 2020-08-10 LAB — HM COLONOSCOPY

## 2020-08-10 SURGERY — COLONOSCOPY WITH PROPOFOL
Anesthesia: General

## 2020-08-10 MED ORDER — FENTANYL CITRATE (PF) 100 MCG/2ML IJ SOLN
INTRAMUSCULAR | Status: DC | PRN
Start: 2020-08-10 — End: 2020-08-10
  Administered 2020-08-10 (×2): 50 ug via INTRAVENOUS

## 2020-08-10 MED ORDER — PROPOFOL 10 MG/ML IV BOLUS
INTRAVENOUS | Status: DC | PRN
  Administered 2020-08-10: 30 mg via INTRAVENOUS
  Administered 2020-08-10: 60 mg via INTRAVENOUS
  Administered 2020-08-10: 150 ug/kg/min via INTRAVENOUS

## 2020-08-10 MED ORDER — STERILE WATER FOR IRRIGATION IR SOLN
Status: DC | PRN
  Administered 2020-08-10: 100 mL

## 2020-08-10 MED ORDER — LACTATED RINGERS IV SOLN: Freq: Once | INTRAVENOUS | Status: AC

## 2020-08-10 MED ORDER — LACTATED RINGERS IV SOLN: INTRAVENOUS | Status: DC | PRN

## 2020-08-10 MED ORDER — DEXTROSE 50 % IV SOLN: 1.0000 | Freq: Once | INTRAVENOUS | Status: AC

## 2020-08-10 MED ORDER — DEXTROSE 50 % IV SOLN
INTRAVENOUS | Status: AC
  Administered 2020-08-10: 50 mL via INTRAVENOUS
  Filled 2020-08-10: qty 50

## 2020-08-10 MED ORDER — FENTANYL CITRATE (PF) 100 MCG/2ML IJ SOLN
INTRAMUSCULAR | Status: AC
  Filled 2020-08-10: qty 2

## 2020-08-10 MED ORDER — PHENYLEPHRINE HCL (PRESSORS) 10 MG/ML IV SOLN
INTRAVENOUS | Status: DC | PRN
  Administered 2020-08-10: 80 ug via INTRAVENOUS

## 2020-08-10 NOTE — Anesthesia Postprocedure Evaluation (Signed)
Anesthesia Post Note  Patient: Gregory Duran  Procedure(s) Performed: COLONOSCOPY WITH PROPOFOL (N/A )  Patient location during evaluation: Endoscopy Anesthesia Type: General Level of consciousness: awake, oriented, awake and alert and patient cooperative Pain management: pain level controlled Vital Signs Assessment: post-procedure vital signs reviewed and stable Respiratory status: respiratory function stable, nonlabored ventilation and spontaneous breathing Cardiovascular status: blood pressure returned to baseline and stable Postop Assessment: no headache and no backache Anesthetic complications: no   No complications documented.   Last Vitals:  Vitals:   08/10/20 0943 08/10/20 1126  BP: 109/69 103/62  Pulse: 75   Resp: 14 17  Temp: 36.4 C 36.5 C  SpO2: 100% 98%    Last Pain:  Vitals:   08/10/20 1126  TempSrc: Oral  PainSc: 0-No pain                 Brynda Peon

## 2020-08-10 NOTE — Op Note (Signed)
[facility] Patient Name: Gregory Duran Procedure Date: 08/10/2020 10:43 AM MRN: [id number] Date of Birth: 08-29-1961 Attending MD: [name] ,  CSN: [id number] Age: 59 Admit Type: Outpatient Procedure:                Colonoscopy Indications:              Screening for colorectal malignant neoplasm Providers:                [name], Sheran Fava, Kristine L.                            Jessee Avers, Technician, Dyann Ruddle Referring MD:              Medicines:                Monitored Anesthesia Care Complications:            No immediate complications. Estimated Blood Loss:     Estimated blood loss: none. Procedure:                Pre-Anesthesia Assessment:                           - Prior to the procedure, a History and Physical                            was performed, and patient medications, allergies                            and sensitivities were reviewed. The patient's                            tolerance of previous anesthesia was reviewed.                           - The risks and benefits of the procedure and the                            sedation options and risks were discussed with the                            patient. All questions were answered and informed                            consent was obtained.                           - ASA Grade Assessment: II - A patient with mild                            systemic disease.                           After obtaining informed consent, the colonoscope                            was passed under direct vision. Throughout the  procedure, the patient's blood pressure, pulse, and                            oxygen saturations were monitored continuously. The                            [id number]-[id number] ([id number]) scope was introduced through                            the anus and advanced to the the cecum, identified                            by appendiceal orifice and  ileocecal valve. The                            colonoscopy was performed without difficulty. The                            patient tolerated the procedure well. The quality                            of the bowel preparation was adequate. Scope                            withdrawal time was 14 minutes. Scope In: 10:58:03 AM Scope Out: 11:24:04 AM Scope Withdrawal Time: 0 hours 14 minutes 10 seconds  Total Procedure Duration: 0 hours 26 minutes 1 second  Findings:      The perianal and digital rectal examinations were normal.      The colon (entire examined portion) appeared normal.      The retroflexed view of the distal rectum and anal verge was normal and       showed no anal or rectal abnormalities. Impression:               - The entire examined colon is normal.                           - The distal rectum and anal verge are normal on                            retroflexion view.                           - No specimens collected. Moderate Sedation:      Per Anesthesia Care Recommendation:           - Discharge patient to home (ambulatory).                           - Resume previous diet.                           - Repeat colonoscopy in 10 years for screening                            purposes. Procedure Code(s):        ---  Professional ---                           O1308, GC, Colorectal cancer screening; colonoscopy                            on individual not meeting criteria for high risk Diagnosis Code(s):        --- Professional ---                           Z12.11, Encounter for screening for malignant                            neoplasm of colon CPT copyright 2019 American Medical Association. All rights reserved. The codes documented in this report are preliminary and upon coder review may  be revised to meet current compliance requirements. [name], MD [name],  08/10/2020 11:28:41 AM This report has been signed electronically. Number of  Addenda: 0

## 2020-08-10 NOTE — Transfer of Care (Signed)
Immediate Anesthesia Transfer of Care Note  Patient: Gregory Duran  Procedure(s) Performed: COLONOSCOPY WITH PROPOFOL (N/A )  Patient Location: Endoscopy Unit  Anesthesia Type:General  Level of Consciousness: awake, alert , oriented and patient cooperative  Airway & Oxygen Therapy: Patient Spontanous Breathing  Post-op Assessment: Report given to RN, Post -op Vital signs reviewed and stable and Patient moving all extremities  Post vital signs: Reviewed and stable  Last Vitals:  Vitals Value Taken Time  BP    Temp    Pulse    Resp    SpO2      Last Pain:  Vitals:   08/10/20 1054  TempSrc:   PainSc: 10-Worst pain ever      Patients Stated Pain Goal: 10 (08/10/20 0943)  Complications: No complications documented.

## 2020-08-10 NOTE — Discharge Instructions (Signed)
You are being discharged to home.  Resume your previous diet.  Your physician has recommended a repeat colonoscopy in 10 years for screening purposes.    Colonoscopy, Adult, Care After This sheet gives you information about how to care for yourself after your procedure. Your doctor may also give you more specific instructions. If you have problems or questions, call your doctor. What can I expect after the procedure? After the procedure, it is common to have:  A small amount of blood in your poop (stool) for 24 hours.  Some gas.  Mild cramping or bloating in your belly (abdomen). Follow these instructions at home: Eating and drinking   Drink enough fluid to keep your pee (urine) pale yellow.  Follow instructions from your doctor about what you cannot eat or drink.  Return to your normal diet as told by your doctor. Avoid heavy or fried foods that are hard to digest. Activity  Rest as told by your doctor.  Do not sit for a long time without moving. Get up to take short walks every 1-2 hours. This is important. Ask for help if you feel weak or unsteady.  Return to your normal activities as told by your doctor. Ask your doctor what activities are safe for you. To help cramping and bloating:   Try walking around.  Put heat on your belly as told by your doctor. Use the heat source that your doctor recommends, such as a moist heat pack or a heating pad. ? Put a towel between your skin and the heat source. ? Leave the heat on for 20-30 minutes. ? Remove the heat if your skin turns bright red. This is very important if you are unable to feel pain, heat, or cold. You may have a greater risk of getting burned. General instructions  For the first 24 hours after the procedure: ? Do not drive or use machinery. ? Do not sign important documents. ? Do not drink alcohol. ? Do your daily activities more slowly than normal. ? Eat foods that are soft and easy to digest.  Take  over-the-counter or prescription medicines only as told by your doctor.  Keep all follow-up visits as told by your doctor. This is important. Contact a doctor if:  You have blood in your poop 2-3 days after the procedure. Get help right away if:  You have more than a small amount of blood in your poop.  You see large clumps of tissue (blood clots) in your poop.  Your belly is swollen.  You feel like you may vomit (nauseous).  You vomit.  You have a fever.  You have belly pain that gets worse, and medicine does not help your pain. Summary  After the procedure, it is common to have a small amount of blood in your poop. You may also have mild cramping and bloating in your belly.  For the first 24 hours after the procedure, do not drive or use machinery, do not sign important documents, and do not drink alcohol.  Get help right away if you have a lot of blood in your poop, feel like you may vomit, have a fever, or have more belly pain. This information is not intended to replace advice given to you by your health care provider. Make sure you discuss any questions you have with your health care provider. Document Revised: 04/13/2019 Document Reviewed: 04/13/2019 Elsevier Patient Education  2020 Elsevier Inc.  

## 2020-08-10 NOTE — Interval H&P Note (Signed)
History and Physical Interval Note:  08/10/2020 10:10 AM  59 y.o. male with past medical history of diabetes, hepatitis C status post treatment with Epclusa, who comes to the hospital for screening colonoscopy.   The patient has never had a colonoscopy in the past.  She denies having any complaints such as melena, hematochezia, abdominal distention, change in her bowel movement consistency or frequency, no changes in her weight recently.  No family history of colorectal cancer.   Laurina Bustle  has presented today for surgery, with the diagnosis of screening.  The various methods of treatment have been discussed with the patient and family. After consideration of risks, benefits and other options for treatment, the patient has consented to  Procedure(s) with comments: COLONOSCOPY WITH PROPOFOL (N/A) - 11:15 as a surgical intervention.  The patient's history has been reviewed, patient examined, no change in status, stable for surgery.  I have reviewed the patient's chart and labs.  Questions were answered to the patient's satisfaction.     [name] May[place]a

## 2020-08-10 NOTE — Anesthesia Preprocedure Evaluation (Addendum)
Anesthesia Evaluation  Patient identified by MRN, date of birth, ID band Patient awake    Reviewed: Allergy & Precautions, NPO status , Patient's Chart, lab work & pertinent test results  History of Anesthesia Complications Negative for: history of anesthetic complications  Airway Mallampati: I  TM Distance: >3 FB Neck ROM: Full    Dental  (+) Dental Advisory Given, Missing   Pulmonary neg pulmonary ROS,    Pulmonary exam normal breath sounds clear to auscultation       Cardiovascular Exercise Tolerance: Good hypertension, Pt. on medications Normal cardiovascular exam Rhythm:Regular Rate:Normal     Neuro/Psych  Neuromuscular disease negative psych ROS   GI/Hepatic GERD  Controlled,(+) Hepatitis -, C  Endo/Other  diabetes, Well Controlled, Type 2, Oral Hypoglycemic Agents  Renal/GU Renal disease     Musculoskeletal Back pain   Abdominal   Peds  Hematology  (+) anemia ,   Anesthesia Other Findings   Reproductive/Obstetrics negative OB ROS                            Anesthesia Physical Anesthesia Plan  ASA: III  Anesthesia Plan: General   Post-op Pain Management:    Induction: Intravenous  PONV Risk Score and Plan: TIVA  Airway Management Planned: Nasal Cannula and Natural Airway  Additional Equipment:   Intra-op Plan:   Post-operative Plan:   Informed Consent: I have reviewed the patients History and Physical, chart, labs and discussed the procedure including the risks, benefits and alternatives for the proposed anesthesia with the patient or authorized representative who has indicated his/her understanding and acceptance.     Dental advisory given  Plan Discussed with: CRNA and Surgeon  Anesthesia Plan Comments:         Anesthesia Quick Evaluation

## 2020-08-11 ENCOUNTER — Emergency Department
Admission: EM | Admit: 2020-08-11 | Discharge: 2020-08-11 | Disposition: A | Discharge status: Home / Self Care | Payer: Medicaid Other | Attending: Emergency Medicine | Admitting: Emergency Medicine

## 2020-08-11 ENCOUNTER — Other Ambulatory Visit: Payer: Self-pay

## 2020-08-11 ENCOUNTER — Encounter: Payer: Self-pay | Admitting: Emergency Medicine

## 2020-08-11 DIAGNOSIS — Z79899 Other long term (current) drug therapy: Secondary | ICD-10-CM | POA: Diagnosis not present

## 2020-08-11 DIAGNOSIS — I10 Essential (primary) hypertension: Secondary | ICD-10-CM | POA: Insufficient documentation

## 2020-08-11 DIAGNOSIS — G8929 Other chronic pain: Secondary | ICD-10-CM | POA: Diagnosis not present

## 2020-08-11 DIAGNOSIS — E119 Type 2 diabetes mellitus without complications: Secondary | ICD-10-CM | POA: Insufficient documentation

## 2020-08-11 DIAGNOSIS — Z7984 Long term (current) use of oral hypoglycemic drugs: Secondary | ICD-10-CM | POA: Diagnosis not present

## 2020-08-11 DIAGNOSIS — M545 Low back pain, unspecified: Secondary | ICD-10-CM | POA: Diagnosis present

## 2020-08-11 MED ORDER — DEXAMETHASONE SODIUM PHOSPHATE 10 MG/ML IJ SOLN
10.0000 mg | Freq: Once | INTRAMUSCULAR | Status: AC
  Administered 2020-08-11: 10 mg via INTRAMUSCULAR
  Filled 2020-08-11: qty 1

## 2020-08-11 MED ORDER — HYDROMORPHONE HCL 1 MG/ML IJ SOLN
1.0000 mg | Freq: Once | INTRAMUSCULAR | Status: DC
  Filled 2020-08-11: qty 1

## 2020-08-11 MED ORDER — HYDROMORPHONE HCL 1 MG/ML IJ SOLN
1.0000 mg | Freq: Once | INTRAMUSCULAR | Status: AC
  Administered 2020-08-11: 1 mg via INTRAMUSCULAR

## 2020-08-11 MED ORDER — PREDNISONE 10 MG PO TABS: ORAL_TABLET | ORAL | 0 refills | Status: DC

## 2020-08-11 MED ORDER — ORPHENADRINE CITRATE 30 MG/ML IJ SOLN
60.0000 mg | Freq: Two times a day (BID) | INTRAMUSCULAR | Status: DC
  Administered 2020-08-11: 60 mg via INTRAMUSCULAR
  Filled 2020-08-11: qty 2

## 2020-08-11 NOTE — ED Notes (Signed)
Pt states pain is no better, medication given did not decrease his pain at all. Will let PA know.

## 2020-08-11 NOTE — ED Provider Notes (Signed)
[facility] Emergency Department Provider Note   ____________________________________________   First MD Initiated Contact with Patient 08/11/20 [phone]     (approximate)  I have reviewed the triage vital signs and the nursing notes.   HISTORY  Chief Complaint Back Pain   HPI Gregory Duran is a 59 y.o. male presents to the ED with complaint of low back pain for the last 2 days. Patient states that he currently is a pain clinic patient and has chronic back pain on a regular basis. He denies any recent injuries to have an exacerbation of his chronic pain. He denies any urinary symptoms. He denies any incontinence of bowel or bladder. Currently he is being seen at [facility] where he has been told that he needs to have surgery. He states his next appointment is next month. Patient reports that currently he takes Percocet for pain. Currently rates his pain as a 10/10.     Past Medical History:  Diagnosis Date  . Chronic back pain   . Chronic shoulder pain   . Diabetes mellitus (HCC)   . Hepatitis C    Patient states that he does not have hep C.  Will bring test results.  . Hypertension   . Sciatica     Patient Active Problem List   Diagnosis Date Noted  . Abdominal mass 07/18/2020  . History of hepatitis C 07/18/2020  . Ear fullness, bilateral 07/18/2020  . GERD (gastroesophageal reflux disease) 06/29/2019  . Gastroesophageal reflux disease with esophagitis   . Acute blood loss anemia   . Controlled type 2 diabetes mellitus with complication, without long-term current use of insulin (HCC)   . Severe dehydration 11/24/2015  . AKI (acute kidney injury) (HCC) 11/24/2015  . Melena 11/24/2015  . Hyponatremia 11/24/2015  . Leukocytosis 11/24/2015  . GI bleed 11/24/2015  . Hypertension 05/05/2012  . Diabetes mellitus (HCC) 05/05/2012  . Chronic low back pain 05/05/2012  . Obesity 05/05/2012    Past Surgical History:  Procedure Laterality Date  .  ESOPHAGOGASTRODUODENOSCOPY (EGD) WITH PROPOFOL N/A 11/25/2015   Procedure: ESOPHAGOGASTRODUODENOSCOPY (EGD) WITH PROPOFOL;  Surgeon: Malissa Hippo, MD;  [facility]: [facility];  Service: Endoscopy;  Laterality: N/A;  . KIDNEY STONE SURGERY      Prior to Admission medications   Medication Sig Start Date End Date Taking? Authorizing Provider  dicyclomine (BENTYL) 10 MG capsule Take 1 capsule (10 mg total) by mouth 2 (two) times daily before a meal. 07/28/20   Marguerita Merles, Reuel Boom, MD  gabapentin (NEURONTIN) 300 MG capsule TAKE 1 CAPSULE BY MOUTH AT BEDTIME FOR 5 DAYS THEN INCREASE TO 1 CAPSULE TWICE DAILY FOR 5 DAYS THEN INCREASE TO 1 CAPSULE THREE TIMES DAILY 02/04/19   [provider]  gabapentin (NEURONTIN) 300 MG capsule Take 1 capsule (300 mg total) by mouth 3 (three) times daily for 5 days. 02/29/20 03/05/20  Willy Eddy, MD  glipiZIDE (GLUCOTROL) 10 MG tablet Take 2 tablets (20 mg total) by mouth daily for 5 days. 02/29/20 03/05/20  Willy Eddy, MD  glipiZIDE (GLUCOTROL) 10 MG tablet Take 10 mg by mouth daily before breakfast.    [provider]  lisinopril-hydrochlorothiazide (ZESTORETIC) 20-25 MG tablet Take 1 tablet by mouth daily for 5 days. 02/29/20 03/05/20  Willy Eddy, MD  lisinopril-hydrochlorothiazide (ZESTORETIC) 20-25 MG tablet Take 1 tablet by mouth in the morning and at bedtime.    [provider]  metFORMIN (GLUCOPHAGE) 500 MG tablet Take 1 tablet (500 mg total)  by mouth 2 (two) times daily with a meal for 5 days. 02/29/20 03/05/20  Willy Eddy, MD  metFORMIN (GLUCOPHAGE) 500 MG tablet Take 1,000 mg by mouth 2 (two) times daily with a meal.    [provider]  methocarbamol (ROBAXIN) 500 MG tablet Take 1 tablet (500 mg total) by mouth 4 (four) times daily. Patient not taking: Reported on 07/18/2020 05/11/20   Cuthriell, Delorise Royals, PA-C  oxycodone (OXY-IR) 5 MG capsule Take 5 mg by mouth every 4 (four) hours as needed.     [provider]  pantoprazole (PROTONIX) 40 MG tablet Take 1 tablet (40 mg total) by mouth daily before breakfast for 5 days. 02/29/20 03/05/20  Willy Eddy, MD  predniSONE (DELTASONE) 10 MG tablet Take 6 tablets  today, on day 2 take 5 tablets, day 3 take 4 tablets, day 4 take 3 tablets, day 5 take  2 tablets and 1 tablet the last day 08/11/20   Tommi Rumps, PA-C    Allergies Fish allergy  Family History  Problem Relation Age of Onset  . Heart disease Mother   . Heart disease Father   . Healthy Sister   . Hypertension Brother   . Hypertension Brother   . Hypertension Brother   . Diabetes Brother     Social History Social History   Tobacco Use  . Smoking status: Never Smoker  . Smokeless tobacco: Never Used  Vaping Use  . Vaping Use: Never used  Substance Use Topics  . Alcohol use: Not Currently  . Drug use: No    Review of Systems Constitutional: No fever/chills Cardiovascular: Denies chest pain. Respiratory: Denies shortness of breath. Gastrointestinal: No abdominal pain.  No nausea, no vomiting.  Musculoskeletal: Positive for low back pain both acute and chronic. Skin: Negative for rash. Neurological: Negative for headaches, focal weakness or numbness. ____________________________________________   PHYSICAL EXAM:  VITAL SIGNS: ED Triage Vitals  Enc Vitals Group     BP 08/11/20 0743 140/71     Pulse Rate 08/11/20 0743 87     Resp 08/11/20 0743 18     Temp 08/11/20 0743 98 F (36.7 C)     Temp Source 08/11/20 0743 Oral     SpO2 08/11/20 0743 95 %     Weight 08/11/20 0741 215 lb (97.5 kg)     Height 08/11/20 0741 5\' 10"  (1.778 m)     Head Circumference --      Peak Flow --      Pain Score 08/11/20 0741 10     Pain Loc --      Pain Edu? --      Excl. in GC? --    Constitutional: Alert and oriented. Well appearing and in no acute distress. Tearful. Eyes: Conjunctivae are normal.  Head: Atraumatic. Neck: No stridor.   Cardiovascular:  Normal rate, regular rhythm. Grossly normal heart sounds.  Good peripheral circulation. Respiratory: Normal respiratory effort.  No retractions. Lungs CTAB. Gastrointestinal: Soft and nontender. No distention. No CVA tenderness. Musculoskeletal: Examination of the back there is no gross deformity. There is generalized tenderness on palpation of L5-S1 and bilateral SI joint soft tissue. Range of motion is slow and guarded secondary to discomfort. No active muscle spasms were seen. Straight leg raises were negative. Good muscle strength bilaterally. Patient is able to stand and ambulate without any assistance. Neurologic:  Normal speech and language. No gross focal neurologic deficits are appreciated. No gait instability. Skin:  Skin is warm, dry and intact.  No ecchymosis or abrasions were noted to suggest injury. Psychiatric: Mood and affect are normal. Speech and behavior are normal.  ____________________________________________   LABS (all labs ordered are listed, but only abnormal results are displayed)  Labs Reviewed - No data to display ____________________________________________   RADIOLOGY Beaulah Corin, personally reviewed previous ordered MRI lumbar spine that was done on 06/08/2020.   PROCEDURES  Procedure(s) performed (including Critical Care):  Procedures   ____________________________________________   INITIAL IMPRESSION / ASSESSMENT AND PLAN / ED COURSE  As part of my medical decision making, I reviewed the following data within the electronic MEDICAL RECORD NUMBER Notes from prior ED visits and [place] Controlled Substance Database   59 year old male presents to the ED with complaint of acute on chronic low back pain. Patient denies any recent injury and states that he is being seen by [facility] in [place]. He had an MRI done in September which suggested that he needed to have surgery. He has a return appointment with [facility] in December. Currently he reports  that he is taking Percocet and gabapentin 3 times daily. Patient was given a prescription for prednisone taper. He is encouraged to call and make a follow-up appointment with [facility] and possibly being referred to a neurosurgeon for evaluation of his back.  ____________________________________________   FINAL CLINICAL IMPRESSION(S) / ED DIAGNOSES  Final diagnoses:  Acute exacerbation of chronic low back pain     ED Discharge Orders         Ordered    predniSONE (DELTASONE) 10 MG tablet        08/11/20 1055          *Please note:  Laurina Bustle was evaluated in Emergency Department on 08/11/2020 for the symptoms described in the history of present illness. He was evaluated in the context of the global COVID-19 pandemic, which necessitated consideration that the patient might be at risk for infection with the SARS-CoV-2 virus that causes COVID-19. Institutional protocols and algorithms that pertain to the evaluation of patients at risk for COVID-19 are in a state of rapid change based on information released by regulatory bodies including the CDC and federal and state organizations. These policies and algorithms were followed during the patient's care in the ED.  Some ED evaluations and interventions may be delayed as a result of limited staffing during and the pandemic.*   Note:  This document was prepared using Dragon voice recognition software and may include unintentional dictation errors.    Tommi Rumps, PA-C 08/11/20 1543    Arnaldo Natal, MD 08/11/20 [phone]

## 2020-08-11 NOTE — ED Notes (Signed)
Pt sitting on bedside, still in pain, dilaudid given per order.

## 2020-08-11 NOTE — ED Triage Notes (Signed)
Pt reports has a lot of back problems and for the past 2 days the pain has been worse and his medication is not working.

## 2020-08-11 NOTE — ED Notes (Signed)
Pt sitting in bed, tearful, states he is in a lot of pain, has already tried pain meds at home with no relief.

## 2020-08-11 NOTE — Discharge Instructions (Signed)
Call [facility] for an appointment time rather than waiting until next month to be seen.  Continue your methocarbamol that was prescribed for you as needed for muscle spasms.  A prescription for prednisone was sent to your pharmacy.  Also the pain medication you are currently taking as needed for pain.  If additional pain medication is needed you will need to follow-up with [facility] for a continued prescription.  May use ice or heat to your muscles as needed for discomfort.

## 2020-08-15 ENCOUNTER — Encounter (INDEPENDENT_AMBULATORY_CARE_PROVIDER_SITE_OTHER): Payer: Self-pay | Admitting: *Deleted

## 2020-08-16 ENCOUNTER — Encounter (HOSPITAL_COMMUNITY): Payer: Self-pay | Admitting: Gastroenterology

## 2020-08-16 ENCOUNTER — Ambulatory Visit
Admission: RE | Admit: 2020-08-16 | Discharge: 2020-08-16 | Disposition: A | Discharge status: Home / Self Care | Payer: Medicaid Other | Source: Ambulatory Visit | Attending: Gastroenterology | Admitting: Gastroenterology

## 2020-08-16 ENCOUNTER — Other Ambulatory Visit: Payer: Self-pay

## 2020-08-16 DIAGNOSIS — K7460 Unspecified cirrhosis of liver: Secondary | ICD-10-CM | POA: Insufficient documentation

## 2020-08-31 ENCOUNTER — Other Ambulatory Visit: Payer: Self-pay

## 2020-08-31 ENCOUNTER — Encounter: Payer: Self-pay | Admitting: Urology

## 2020-08-31 ENCOUNTER — Ambulatory Visit (INDEPENDENT_AMBULATORY_CARE_PROVIDER_SITE_OTHER): Payer: Medicaid Other | Admitting: Urology

## 2020-08-31 VITALS — BP 134/86 | HR 84 | Temp 97.8°F | Ht 70.0 in | Wt 220.0 lb

## 2020-08-31 DIAGNOSIS — N200 Calculus of kidney: Secondary | ICD-10-CM | POA: Diagnosis not present

## 2020-08-31 DIAGNOSIS — R351 Nocturia: Secondary | ICD-10-CM | POA: Diagnosis not present

## 2020-08-31 DIAGNOSIS — M545 Low back pain, unspecified: Secondary | ICD-10-CM

## 2020-08-31 DIAGNOSIS — R310 Gross hematuria: Secondary | ICD-10-CM

## 2020-08-31 LAB — URINALYSIS, ROUTINE W REFLEX MICROSCOPIC
Bilirubin, UA: NEGATIVE
Glucose, UA: NEGATIVE
Ketones, UA: NEGATIVE
Leukocytes,UA: NEGATIVE
Nitrite, UA: NEGATIVE
Protein,UA: NEGATIVE
RBC, UA: NEGATIVE
Specific Gravity, UA: 1.025 (ref 1.005–1.030)
Urobilinogen, Ur: 0.2 mg/dL (ref 0.2–1.0)
pH, UA: 5.5 (ref 5.0–7.5)

## 2020-08-31 MED ORDER — HYDROMORPHONE HCL 4 MG PO TABS
4.0000 mg | ORAL_TABLET | ORAL | 0 refills | Status: DC | PRN
Start: 2020-08-31 — End: 2020-09-12

## 2020-08-31 MED ORDER — ALFUZOSIN HCL ER 10 MG PO TB24: 10.0000 mg | ORAL_TABLET | Freq: Every day | ORAL | 11 refills | Status: DC

## 2020-08-31 NOTE — Patient Instructions (Signed)

## 2020-08-31 NOTE — Progress Notes (Signed)
Bladder Scan Patient can void: 38 ml Performed By: Bridgette Habermann, lpn  Urological Symptom Review  Patient is experiencing the following symptoms: Frequent urination Hard to postpone urination Get up at night to urinate Leakage of urine Blood in urine Erection problems (male only)  Kidney stone   Review of Systems  Gastrointestinal (upper)  : Negative for upper GI symptoms  Gastrointestinal (lower) : Negative for lower GI symptoms  Constitutional : Weight loss Fatigue  Skin: Negative for skin symptoms  Eyes: Negative for eye symptoms  Ear/Nose/Throat : Negative for Ear/Nose/Throat symptoms  Hematologic/Lymphatic: Negative for Hematologic/Lymphatic symptoms  Cardiovascular : Negative for cardiovascular symptoms  Respiratory : Negative for respiratory symptoms  Endocrine: Negative for endocrine symptoms  Musculoskeletal: Back pain  Neurological: Negative for neurological symptoms  Psychologic: Depression Anxiety

## 2020-08-31 NOTE — Progress Notes (Signed)
08/31/2020 9:28 AM   Gregory Duran [birth year] [id number]  Referring provider: No referring provider defined for this encounter.  Gross hematuria  HPI: Gregory Duran is a 59yo here for evaluation of gross hematuria. He underwent CT on 10/28 and was found to have an 44mm right renal calculus with mild perinephric stranding. Last stone event was 2008. He had gross hematuria last week. He has worsening urinary urgency, urinary frequency, nocturia 1-2x, weak stream. He is not on BPH therapy   PMH: Past Medical History:  Diagnosis Date  . Chronic back pain   . Chronic shoulder pain   . Diabetes mellitus (HCC)   . Hepatitis C    Patient states that he does not have hep C.  Will bring test results.  . Hypertension   . Sciatica     Surgical History: Past Surgical History:  Procedure Laterality Date  . COLONOSCOPY WITH PROPOFOL N/A 08/10/2020   Procedure: COLONOSCOPY WITH PROPOFOL;  Surgeon: Dolores Frame, MD;  Location[facility];  Service: Gastroenterology;  Laterality: N/A;  11:15  . ESOPHAGOGASTRODUODENOSCOPY (EGD) WITH PROPOFOL N/A 11/25/2015   Procedure: ESOPHAGOGASTRODUODENOSCOPY (EGD) WITH PROPOFOL;  Surgeon: Malissa Hippo, MD;  Location: [facility];  Service: Endoscopy;  Laterality: N/A;  . KIDNEY STONE SURGERY      Home Medications:  Allergies as of 08/31/2020      Reactions   Fish Allergy Nausea And Vomiting      Medication List       Accurate as of August 31, 2020  9:28 AM. If you have any questions, ask your nurse or doctor.        dicyclomine 10 MG capsule Commonly known as: Bentyl Take 1 capsule (10 mg total) by mouth 2 (two) times daily before a meal.   gabapentin 300 MG capsule Commonly known as: NEURONTIN TAKE 1 CAPSULE BY MOUTH AT BEDTIME FOR 5 DAYS THEN INCREASE TO 1 CAPSULE TWICE DAILY FOR 5 DAYS THEN INCREASE TO 1 CAPSULE THREE TIMES DAILY   gabapentin 300 MG capsule Commonly known as: Neurontin Take 1 capsule (300 mg total)  by mouth 3 (three) times daily for 5 days.   glipiZIDE 10 MG tablet Commonly known as: GLUCOTROL Take 10 mg by mouth daily before breakfast.   glipiZIDE 10 MG tablet Commonly known as: GLUCOTROL Take 2 tablets (20 mg total) by mouth daily for 5 days.   lisinopril-hydrochlorothiazide 20-25 MG tablet Commonly known as: ZESTORETIC Take 1 tablet by mouth in the morning and at bedtime.   lisinopril-hydrochlorothiazide 20-25 MG tablet Commonly known as: ZESTORETIC Take 1 tablet by mouth daily for 5 days.   metFORMIN 500 MG tablet Commonly known as: GLUCOPHAGE Take 1,000 mg by mouth 2 (two) times daily with a meal.   metFORMIN 500 MG tablet Commonly known as: GLUCOPHAGE Take 1 tablet (500 mg total) by mouth 2 (two) times daily with a meal for 5 days.   methocarbamol 500 MG tablet Commonly known as: Robaxin Take 1 tablet (500 mg total) by mouth 4 (four) times daily.   oxycodone 5 MG capsule Commonly known as: OXY-IR Take 5 mg by mouth every 4 (four) hours as needed.   pantoprazole 40 MG tablet Commonly known as: PROTONIX Take 1 tablet (40 mg total) by mouth daily before breakfast for 5 days.   predniSONE 10 MG tablet Commonly known as: DELTASONE Take 6 tablets  today, on day 2 take 5 tablets, day 3 take 4 tablets, day 4 take 3 tablets,  day 5 take  2 tablets and 1 tablet the last day       Allergies:  Allergies  Allergen Reactions  . Fish Allergy Nausea And Vomiting    Family History: Family History  Problem Relation Age of Onset  . Heart disease Mother   . Heart disease Father   . Healthy Sister   . Hypertension Brother   . Hypertension Brother   . Hypertension Brother   . Diabetes Brother     Social History:  reports that he has never smoked. He has never used smokeless tobacco. He reports previous alcohol use. He reports that he does not use drugs.  ROS: All other review of systems were reviewed and are negative except what is noted above in HPI  Physical  Exam: BP 134/86   Pulse 84   Temp 97.8 F (36.6 C)   Ht 5\' 10"  (1.778 m)   Wt 220 lb (99.8 kg)   BMI 31.57 kg/m   Constitutional:  Alert and oriented, No acute distress. HEENT: [place] AT, moist mucus membranes.  Trachea midline, no masses. Cardiovascular: No clubbing, cyanosis, or edema. Respiratory: Normal respiratory effort, no increased work of breathing. GI: Abdomen is soft, nontender, nondistended, no abdominal masses GU: No CVA tenderness.  Lymph: No cervical or inguinal lymphadenopathy. Skin: No rashes, bruises or suspicious lesions. Neurologic: Grossly intact, no focal deficits, moving all 4 extremities. Psychiatric: Normal mood and affect.  Laboratory Data: Lab Results  Component Value Date   WBC 7.8 07/18/2020   HGB 12.9 (L) 07/18/2020   HCT 38.5 07/18/2020   MCV 91.9 07/18/2020   PLT 190 07/18/2020    Lab Results  Component Value Date   CREATININE 0.82 08/09/2020    No results found for: PSA  No results found for: TESTOSTERONE  Lab Results  Component Value Date   HGBA1C 7.6 (H) 11/24/2015    Urinalysis    Component Value Date/Time   COLORURINE YELLOW (A) 02/27/2020 1031   APPEARANCEUR CLEAR (A) 02/27/2020 1031   LABSPEC 1.027 02/27/2020 1031   PHURINE 6.0 02/27/2020 1031   GLUCOSEU >=500 (A) 02/27/2020 1031   HGBUR SMALL (A) 02/27/2020 1031   BILIRUBINUR NEGATIVE 02/27/2020 1031   KETONESUR 5 (A) 02/27/2020 1031   PROTEINUR 30 (A) 02/27/2020 1031   UROBILINOGEN 0.2 04/15/2014 1543   NITRITE NEGATIVE 02/27/2020 1031   LEUKOCYTESUR NEGATIVE 02/27/2020 1031    Lab Results  Component Value Date   BACTERIA NONE SEEN 02/27/2020    Pertinent Imaging: CT abd/pelvis 07/28/2020: images reviewed and discussed No results found for this or any previous visit.  No results found for this or any previous visit.  No results found for this or any previous visit.  No results found for this or any previous visit.  Results for orders placed during the  hospital encounter of 08/19/15  08/21/15 Renal  Narrative CLINICAL DATA:  Follow-up of previously demonstrated prominence of left renal collecting system ; known kidney stones.  EXAM: RENAL / URINARY TRACT ULTRASOUND COMPLETE  COMPARISON:  Renal ultrasound of March 25, 2015  FINDINGS: Right Kidney:  Length: 12.9 cm. Echogenicity within normal limits. No hydronephrosis visualized. Probable 5 mm stone in a mid to lower pole infundibulum.  Left Kidney:  Length: 13.6 cm. Echogenicity within normal limits. No hydronephrosis visualized. There is a probable stone in the lower pole measuring 4 mm.  Bladder:  Appears normal for degree of bladder distention.  IMPRESSION: Bilateral kidney stones without evidence of obstruction. Previous fullness of  the left renal collecting system has resolved.   Electronically Signed By: David  [place] M.D. On: 08/19/2015 13:36  No results found for this or any previous visit.  No results found for this or any previous visit.  No results found for this or any previous visit.   Assessment & Plan:    1. Nocturia -uroxatral 10mg  qhs  2. Gross hematuria -likely related to right renal calculus.  -We discussed the management of kidney stones. These options include observation, ureteroscopy, shockwave lithotripsy (ESWL) and percutaneous nephrolithotomy (PCNL). We discussed which options are relevant to the patient's stone(s). We discussed the natural history of kidney stones as well as the complications of untreated stones and the impact on quality of life without treatment as well as with each of the above listed treatments. We also discussed the efficacy of each treatment in its ability to clear the stone burden. With any of these management options I discussed the signs and symptoms of infection and the need for emergent treatment should these be experienced. For each option we discussed the ability of each procedure to clear the patient of their stone  burden.   For observation I described the risks which include but are not limited to silent renal damage, life-threatening infection, need for emergent surgery, failure to pass stone and pain.   For ureteroscopy I described the risks which include bleeding, infection, damage to contiguous structures, positioning injury, ureteral stricture, ureteral avulsion, ureteral injury, need for prolonged ureteral stent, inability to perform ureteroscopy, need for an interval procedure, inability to clear stone burden, stent discomfort/pain, heart attack, stroke, pulmonary embolus and the inherent risks with general anesthesia.   For shockwave lithotripsy I described the risks which include arrhythmia, kidney contusion, kidney hemorrhage, need for transfusion, pain, inability to adequately break up stone, inability to pass stone fragments, Steinstrasse, infection associated with obstructing stones, need for alternate surgical procedure, need for repeat shockwave lithotripsy, MI, CVA, PE and the inherent risks with anesthesia/conscious sedation.   For PCNL I described the risks including positioning injury, pneumothorax, hydrothorax, need for chest tube, inability to clear stone burden, renal laceration, arterial venous fistula or malformation, need for embolization of kidney, loss of kidney or renal function, need for repeat procedure, need for prolonged nephrostomy tube, ureteral avulsion, MI, CVA, PE and the inherent risks of general anesthesia.   - The patient would like to proceed with right ureteral stone extraction - Urinalysis, Routine w reflex microscopic    No follow-ups on file.  , MD  [facility] [place]

## 2020-09-12 ENCOUNTER — Other Ambulatory Visit: Payer: Self-pay | Admitting: Urology

## 2020-09-12 IMAGING — CR DG CHEST 2V
1 series · 2 of 2 positions shown · non-contrast
Comparison: 06/04/2018

CLINICAL DATA: Pt stated his chest feels "full" pt has had cough
and symptoms for 1 week. Pt stated he had difficulty keeping water
down last night. Pt is a non-smoker. Pt has hx of HTN and
bronchitis. Pt has had no previous cardiac surgeries. Pt is a
diabetic

EXAM:
CHEST - 2 VIEW

[Series 1: dg chest 2 view · 0.14mm/px · 2 of 2 slices shown]
[im 1/2]
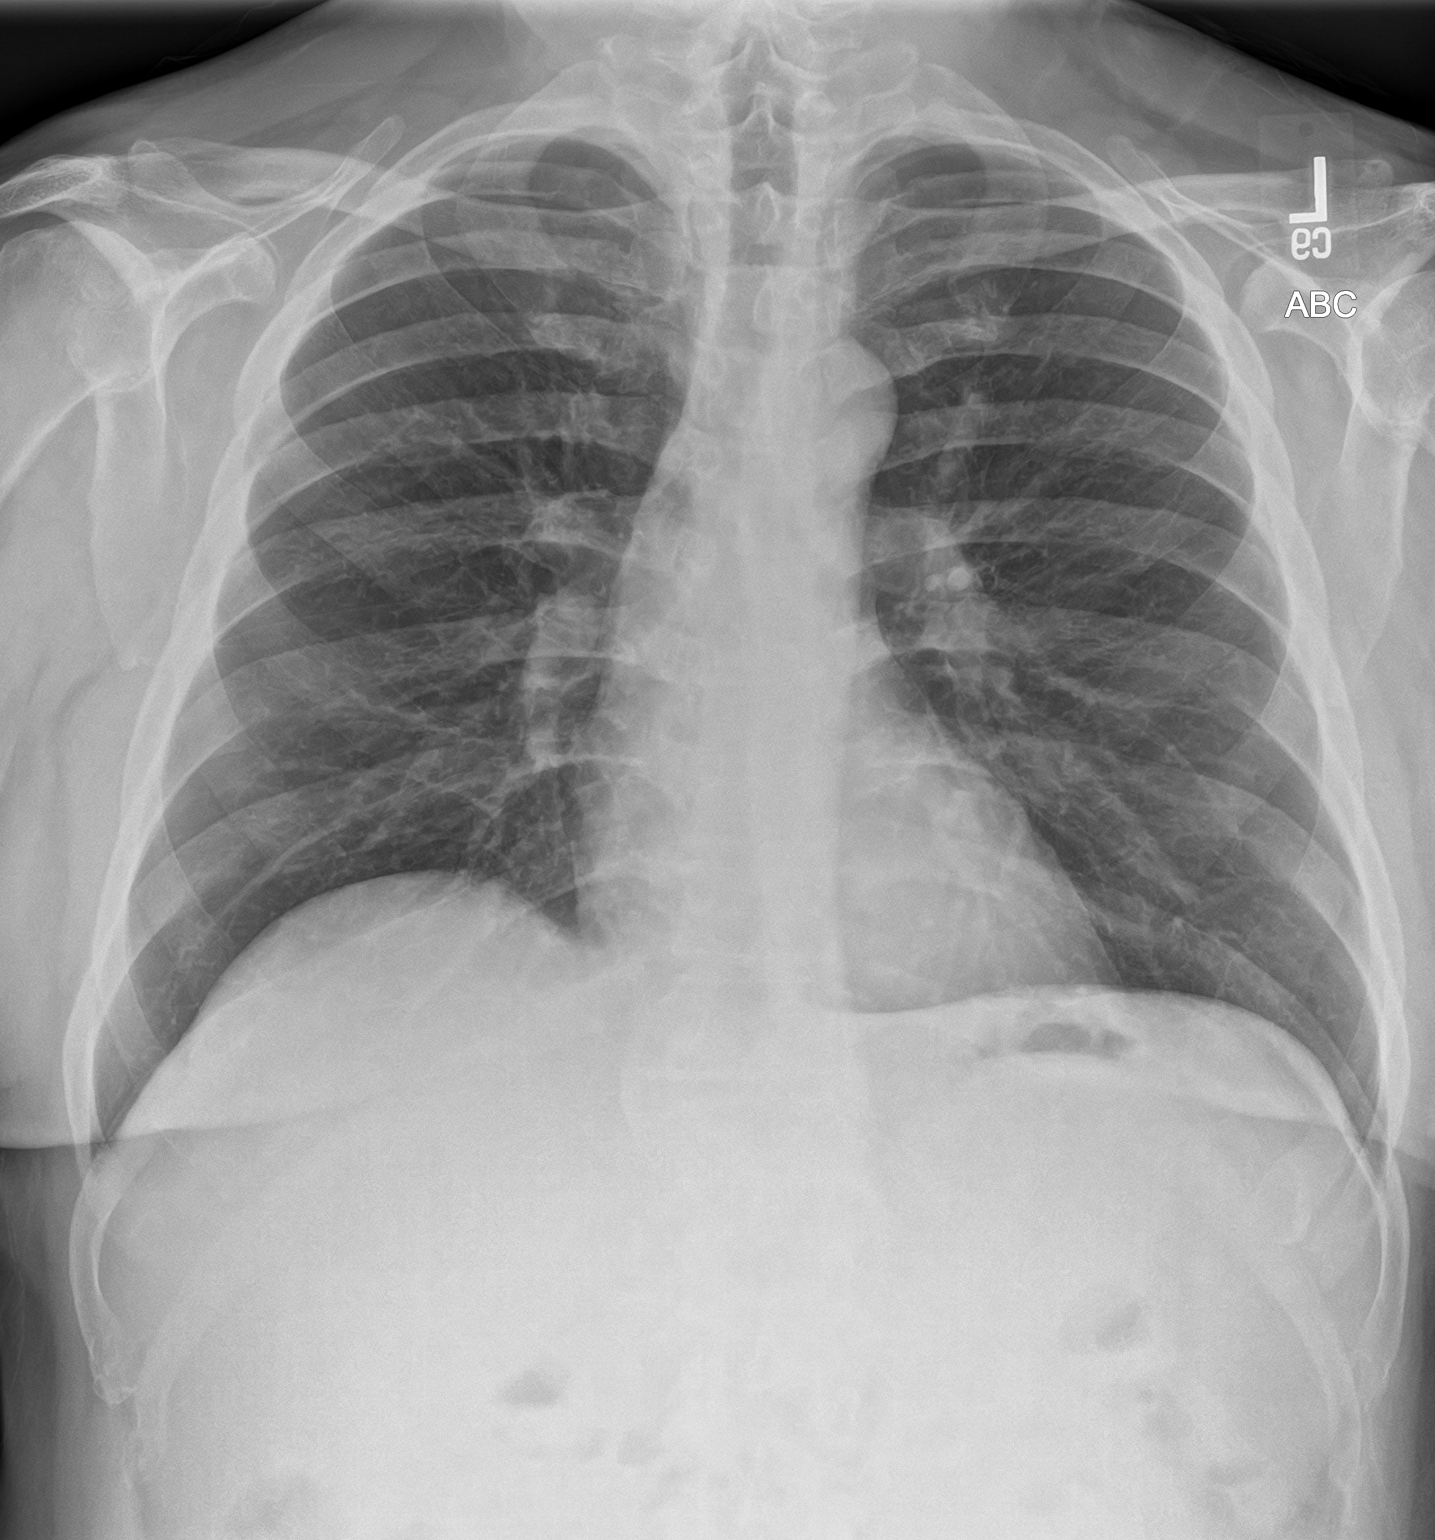
[im 2/2]
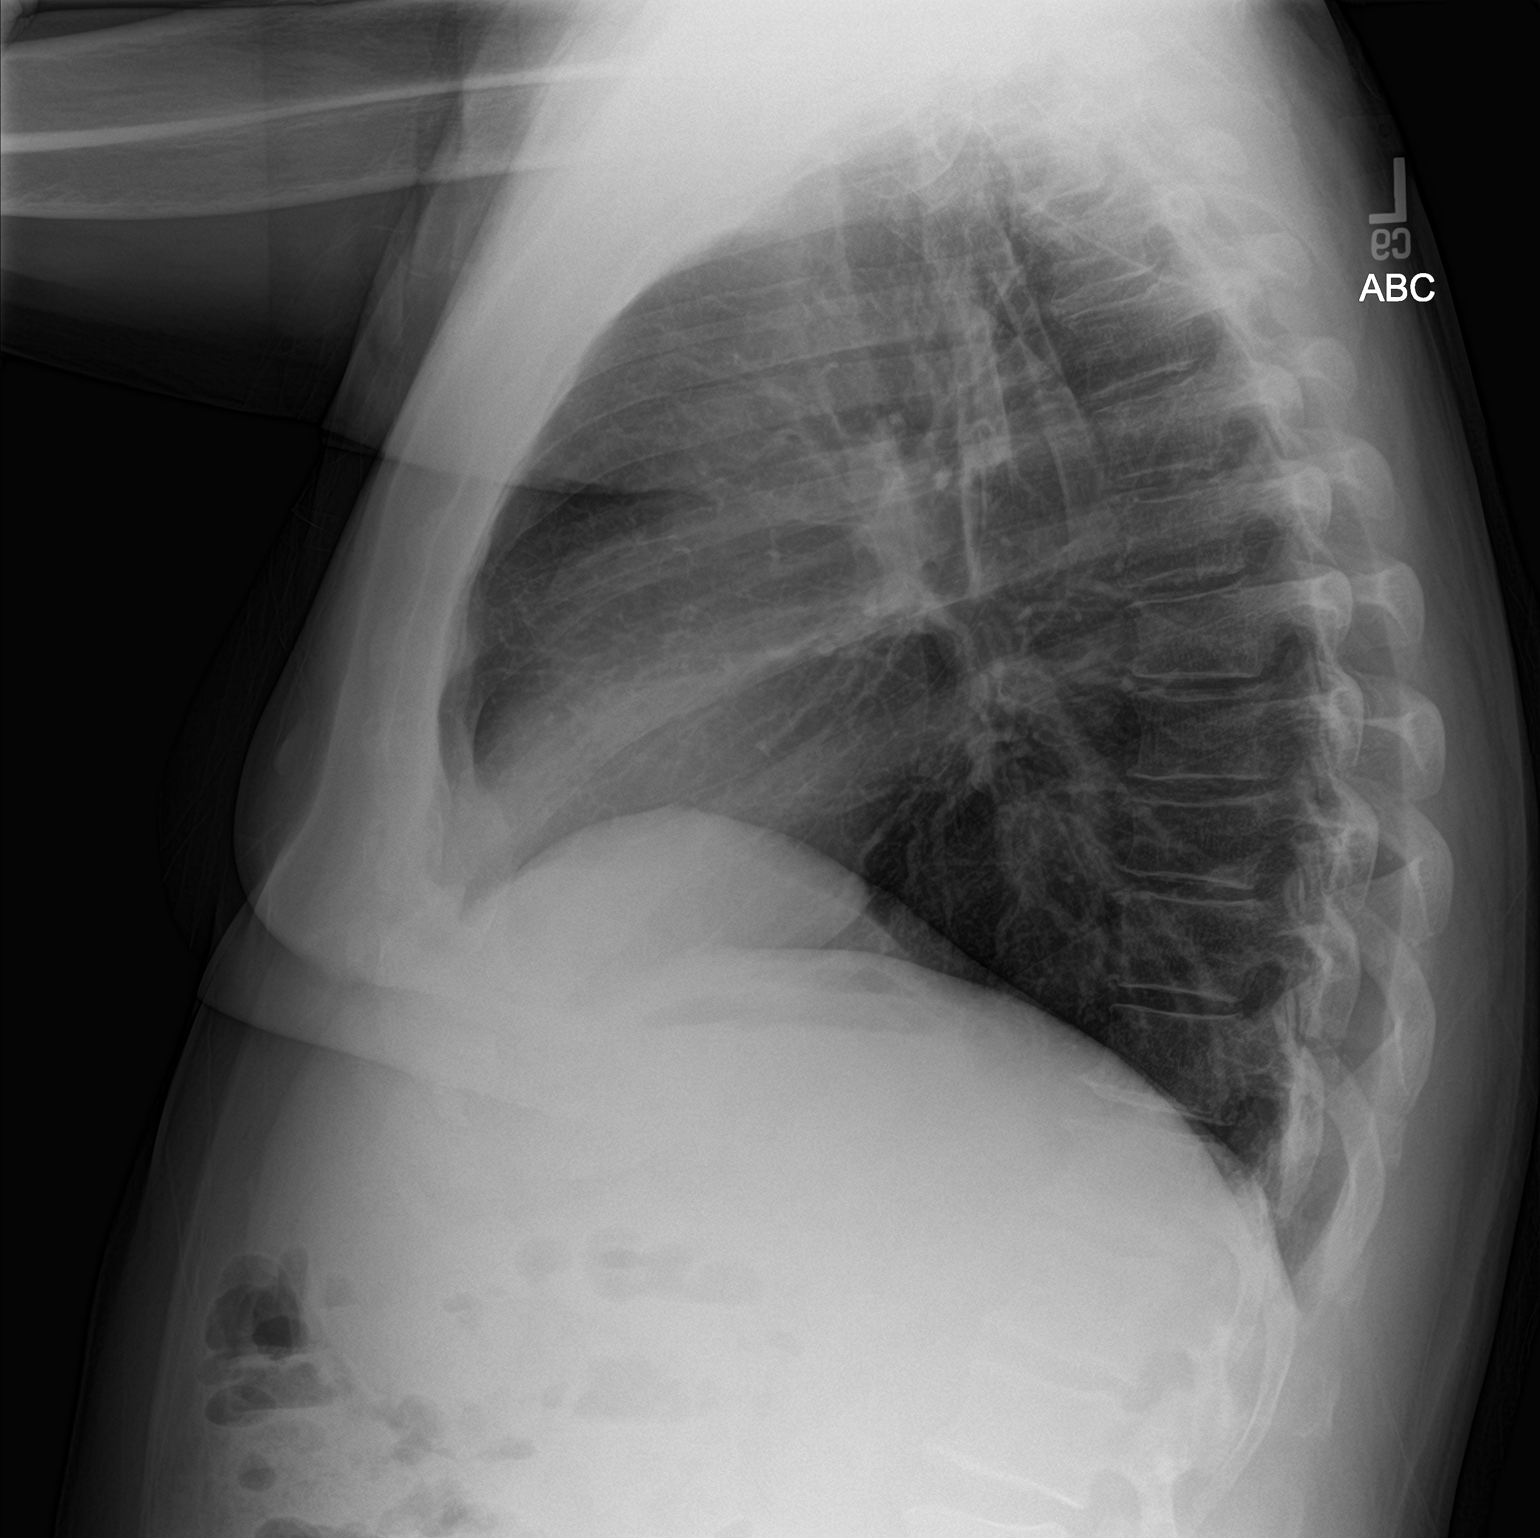

[2 of 2 positions shown; findings below may reference images not displayed]

FINDINGS: The heart size and mediastinal contours are within normal limits.
Both lungs are clear. No pleural effusion or pneumothorax. The
visualized skeletal structures are unremarkable.
IMPRESSION: No active cardiopulmonary disease.

## 2020-09-27 ENCOUNTER — Telehealth: Payer: Self-pay

## 2020-09-27 MED ORDER — HYDROMORPHONE HCL 4 MG PO TABS: 4.0000 mg | ORAL_TABLET | ORAL | 0 refills | Status: DC | PRN

## 2020-09-27 NOTE — Telephone Encounter (Signed)
rx refilled.

## 2020-09-27 NOTE — Telephone Encounter (Signed)
Rx sent 

## 2020-10-08 ENCOUNTER — Other Ambulatory Visit: Payer: Self-pay

## 2020-10-08 ENCOUNTER — Emergency Department
Admission: EM | Admit: 2020-10-08 | Discharge: 2020-10-08 | Disposition: A | Discharge status: Home / Self Care | Payer: Medicaid Other | Attending: Emergency Medicine | Admitting: Emergency Medicine

## 2020-10-08 DIAGNOSIS — N200 Calculus of kidney: Secondary | ICD-10-CM | POA: Insufficient documentation

## 2020-10-08 DIAGNOSIS — R109 Unspecified abdominal pain: Secondary | ICD-10-CM | POA: Insufficient documentation

## 2020-10-08 DIAGNOSIS — Z5321 Procedure and treatment not carried out due to patient leaving prior to being seen by health care provider: Secondary | ICD-10-CM | POA: Insufficient documentation

## 2020-10-08 LAB — BASIC METABOLIC PANEL
Anion gap: 14 (ref 5–15)
BUN: 19 mg/dL (ref 6–20)
CO2: 24 mmol/L (ref 22–32)
Calcium: 8.5 mg/dL — ABNORMAL LOW (ref 8.9–10.3)
Chloride: 100 mmol/L (ref 98–111)
Creatinine, Ser: 0.61 mg/dL (ref 0.61–1.24)
GFR, Estimated: >60 mL/min (ref >60)
Glucose, Bld: 288 mg/dL — ABNORMAL HIGH (ref 70–99)
Potassium: 3.7 mmol/L (ref 3.5–5.1)
Sodium: 138 mmol/L (ref 135–145)

## 2020-10-08 LAB — CBC
HCT: 43.7 % (ref 39.0–52.0)
Hemoglobin: 15.1 g/dL (ref 13.0–17.0)
MCH: 29.9 pg (ref 26.0–34.0)
MCHC: 34.6 g/dL (ref 30.0–36.0)
MCV: 86.5 fL (ref 80.0–100.0)
Platelets: 229 10*3/uL (ref 150–400)
RBC: 5.05 MIL/uL (ref 4.22–5.81)
RDW: 13.8 % (ref 11.5–15.5)
WBC: 7.2 10*3/uL (ref 4.0–10.5)
nRBC: 0 % (ref 0.0–0.2)

## 2020-10-08 LAB — URINALYSIS, COMPLETE (UACMP) WITH MICROSCOPIC
Bacteria, UA: NONE SEEN
Bilirubin Urine: NEGATIVE
Glucose, UA: >=500 mg/dL — AB
Hgb urine dipstick: NEGATIVE
Ketones, ur: NEGATIVE mg/dL
Leukocytes,Ua: NEGATIVE
Nitrite: NEGATIVE
Protein, ur: 30 mg/dL — AB
Specific Gravity, Urine: 1.023 (ref 1.005–1.030)
pH: 6 (ref 5.0–8.0)

## 2020-10-08 LAB — LIPASE, BLOOD: Lipase: 33 U/L (ref 11–51)

## 2020-10-08 NOTE — ED Triage Notes (Signed)
Pt comes pov with left sided flank pain. Hx of stones.

## 2020-10-08 NOTE — ED Notes (Signed)
Pt states that he is on pain medication and states that he doesn't need any more pain meds today.

## 2020-10-09 ENCOUNTER — Emergency Department: Payer: Medicaid Other

## 2020-10-09 ENCOUNTER — Emergency Department
Admission: EM | Admit: 2020-10-09 | Discharge: 2020-10-09 | Disposition: A | Discharge status: Home / Self Care | Payer: Medicaid Other | Attending: Emergency Medicine | Admitting: Emergency Medicine

## 2020-10-09 ENCOUNTER — Other Ambulatory Visit: Payer: Self-pay

## 2020-10-09 ENCOUNTER — Encounter: Payer: Self-pay | Admitting: Emergency Medicine

## 2020-10-09 DIAGNOSIS — F10930 Alcohol use, unspecified with withdrawal, uncomplicated: Secondary | ICD-10-CM

## 2020-10-09 DIAGNOSIS — F10230 Alcohol dependence with withdrawal, uncomplicated: Secondary | ICD-10-CM

## 2020-10-09 DIAGNOSIS — F10239 Alcohol dependence with withdrawal, unspecified: Secondary | ICD-10-CM | POA: Diagnosis not present

## 2020-10-09 DIAGNOSIS — N200 Calculus of kidney: Secondary | ICD-10-CM | POA: Diagnosis not present

## 2020-10-09 DIAGNOSIS — Z7984 Long term (current) use of oral hypoglycemic drugs: Secondary | ICD-10-CM | POA: Insufficient documentation

## 2020-10-09 DIAGNOSIS — Z79899 Other long term (current) drug therapy: Secondary | ICD-10-CM | POA: Insufficient documentation

## 2020-10-09 DIAGNOSIS — Y900 Blood alcohol level of less than 20 mg/100 ml: Secondary | ICD-10-CM | POA: Insufficient documentation

## 2020-10-09 DIAGNOSIS — I10 Essential (primary) hypertension: Secondary | ICD-10-CM | POA: Insufficient documentation

## 2020-10-09 DIAGNOSIS — E119 Type 2 diabetes mellitus without complications: Secondary | ICD-10-CM | POA: Diagnosis not present

## 2020-10-09 DIAGNOSIS — R109 Unspecified abdominal pain: Secondary | ICD-10-CM | POA: Diagnosis present

## 2020-10-09 LAB — CBC
HCT: 41.8 % (ref 39.0–52.0)
Hemoglobin: 14.8 g/dL (ref 13.0–17.0)
MCH: 30.3 pg (ref 26.0–34.0)
MCHC: 35.4 g/dL (ref 30.0–36.0)
MCV: 85.7 fL (ref 80.0–100.0)
Platelets: 221 10*3/uL (ref 150–400)
RBC: 4.88 MIL/uL (ref 4.22–5.81)
RDW: 13.6 % (ref 11.5–15.5)
WBC: 8.9 10*3/uL (ref 4.0–10.5)
nRBC: 0 % (ref 0.0–0.2)

## 2020-10-09 LAB — SALICYLATE LEVEL: Salicylate Lvl: <7 mg/dL — ABNORMAL LOW (ref 7.0–30.0)

## 2020-10-09 LAB — URINE DRUG SCREEN, QUALITATIVE (ARMC ONLY)
Amphetamines, Ur Screen: NOT DETECTED
Barbiturates, Ur Screen: NOT DETECTED
Benzodiazepine, Ur Scrn: NOT DETECTED
Cannabinoid 50 Ng, Ur ~~LOC~~: NOT DETECTED
Cocaine Metabolite,Ur ~~LOC~~: NOT DETECTED
MDMA (Ecstasy)Ur Screen: NOT DETECTED
Methadone Scn, Ur: NOT DETECTED
Opiate, Ur Screen: POSITIVE — AB
Phencyclidine (PCP) Ur S: NOT DETECTED
Tricyclic, Ur Screen: NOT DETECTED

## 2020-10-09 LAB — COMPREHENSIVE METABOLIC PANEL
ALT: 115 U/L — ABNORMAL HIGH (ref 0–44)
AST: 150 U/L — ABNORMAL HIGH (ref 15–41)
Albumin: 3.6 g/dL (ref 3.5–5.0)
Alkaline Phosphatase: 63 U/L (ref 38–126)
Anion gap: 18 — ABNORMAL HIGH (ref 5–15)
BUN: 20 mg/dL (ref 6–20)
CO2: 19 mmol/L — ABNORMAL LOW (ref 22–32)
Calcium: 8.7 mg/dL — ABNORMAL LOW (ref 8.9–10.3)
Chloride: 97 mmol/L — ABNORMAL LOW (ref 98–111)
Creatinine, Ser: 0.66 mg/dL (ref 0.61–1.24)
GFR, Estimated: >60 mL/min (ref >60)
Glucose, Bld: 156 mg/dL — ABNORMAL HIGH (ref 70–99)
Potassium: 4.2 mmol/L (ref 3.5–5.1)
Sodium: 134 mmol/L — ABNORMAL LOW (ref 135–145)
Total Bilirubin: 2.1 mg/dL — ABNORMAL HIGH (ref 0.3–1.2)
Total Protein: 6.4 g/dL — ABNORMAL LOW (ref 6.5–8.1)

## 2020-10-09 LAB — URINALYSIS, COMPLETE (UACMP) WITH MICROSCOPIC
Bacteria, UA: NONE SEEN
Bilirubin Urine: NEGATIVE
Glucose, UA: 50 mg/dL — AB
Ketones, ur: 20 mg/dL — AB
Leukocytes,Ua: NEGATIVE
Nitrite: NEGATIVE
Protein, ur: 30 mg/dL — AB
Specific Gravity, Urine: 1.029 (ref 1.005–1.030)
pH: 6 (ref 5.0–8.0)

## 2020-10-09 LAB — ACETAMINOPHEN LEVEL: Acetaminophen (Tylenol), Serum: <10 ug/mL — ABNORMAL LOW (ref 10–30)

## 2020-10-09 LAB — ETHANOL: Alcohol, Ethyl (B): <10 mg/dL (ref <10)

## 2020-10-09 MED ORDER — CHLORDIAZEPOXIDE HCL 25 MG PO CAPS
50.0000 mg | ORAL_CAPSULE | Freq: Once | ORAL | Status: AC
  Administered 2020-10-09: 50 mg via ORAL
  Filled 2020-10-09: qty 2

## 2020-10-09 MED ORDER — ONDANSETRON HCL 4 MG/2ML IJ SOLN
4.0000 mg | Freq: Once | INTRAMUSCULAR | Status: AC
  Administered 2020-10-09: 4 mg via INTRAVENOUS
  Filled 2020-10-09: qty 2

## 2020-10-09 MED ORDER — ONDANSETRON 4 MG PO TBDP: 4.0000 mg | ORAL_TABLET | Freq: Three times a day (TID) | ORAL | 0 refills | Status: DC | PRN

## 2020-10-09 MED ORDER — THIAMINE HCL 100 MG PO TABS: 100.0000 mg | ORAL_TABLET | Freq: Every day | ORAL | 0 refills | Status: AC

## 2020-10-09 MED ORDER — CHLORDIAZEPOXIDE HCL 25 MG PO CAPS: 25.0000 mg | ORAL_CAPSULE | Freq: Three times a day (TID) | ORAL | 0 refills | Status: DC | PRN

## 2020-10-09 MED ORDER — LORAZEPAM 2 MG/ML IJ SOLN
2.0000 mg | Freq: Once | INTRAMUSCULAR | Status: AC
  Administered 2020-10-09: 2 mg via INTRAVENOUS
  Filled 2020-10-09: qty 1

## 2020-10-09 MED ORDER — SODIUM CHLORIDE 0.9 % IV BOLUS
1000.0000 mL | Freq: Once | INTRAVENOUS | Status: AC
  Administered 2020-10-09: 1000 mL via INTRAVENOUS

## 2020-10-09 NOTE — ED Provider Notes (Signed)
[facility] Emergency Department Provider Note  ____________________________________________  Time seen: Approximately 12:20 PM  I have reviewed the triage vital signs and the nursing notes.   HISTORY  Chief Complaint No chief complaint on file.    HPI Gregory BustleWilliam A Steenson is a 60 y.o. male who presents the emergency department for multiple complaints.  Patient primary complaint is concerned that he may have overdosed on pain medication.  Patient states that  he has had increased flank pain from known kidney stones, had had some decreased urination and was concerned that he may have kidney stone trying to move.  Patient states that he had taken Percocet earlier today and then took another dose "too quickly."  Patient states that he had taken 10 mg of Percocet followed by another 10 mg of Percocet within approximately an hour's time frame.  After taking this amount of medication he walked to the fire department to be evaluated as he started to feel "flushed."  Patient had EMS transport from the fire department to the emergency department with IV fluids administered.  Patient was not given any Narcan, did not become somnolent and has had no respiratory issues.  Patient states that the pain medication consumption was not an intentional overdose.  Patient states that he was trying to control his pain.  He is not suicidal homicidal at this time.  Patient is concerned about his flank pain, states that he has a history of kidney stones.  He states that he has multiple known kidney stones to the left kidney, states that he has a few stones in the right kidney including one that measures "half an inch."  Patient states that he has had lithotripsy and stents in the past.  Patient would prefer not to have stents as he has had complications from stent placement in the past.  He does have intermittent hematuria, is currently experiencing hematuria.  He states that he also notices that he is  urinated slightly less than normal over the past day or 2.  Patient denies any fevers or dysuria.  No penile discharge.  Patient is also concerned for withdrawal from alcohol.  Patient states that he drinks a significant amount of alcohol every day.  Patient is unable to exactly quantify but it appears that he is drinking approximately 1 L bottles of 10% alcohol daily.  It appears that patient could be drinking upwards of 12-14 bottles a day.  Patient states that his last alcohol consumption was last night.  He states that he does have a history of alcoholism and has had withdrawals in the past.  Patient states that he has successfully quit alcohol for time.  But has been drinking again, quite heavily over the last month.  Patient is concerned that he is going to have withdrawals from his alcohol consumption.  Patient with a history of nephrolithiasis, hepatitis C, GERD, anemia, hyponatremia, GI bleed, diabetes, hypertension.        Past Medical History:  Diagnosis Date  . Chronic back pain   . Chronic shoulder pain   . Diabetes mellitus (HCC)   . Hepatitis C    Patient states that he does not have hep C.  Will bring test results.  . Hypertension   . Sciatica     Patient Active Problem List   Diagnosis Date Noted  . Nephrolithiasis 08/31/2020  . Abdominal mass 07/18/2020  . History of hepatitis C 07/18/2020  . Ear fullness, bilateral 07/18/2020  . GERD (gastroesophageal reflux disease)  06/29/2019  . Gastroesophageal reflux disease with esophagitis   . Acute blood loss anemia   . Controlled type 2 diabetes mellitus with complication, without long-term current use of insulin (HCC)   . Severe dehydration 11/24/2015  . AKI (acute kidney injury) (HCC) 11/24/2015  . Melena 11/24/2015  . Hyponatremia 11/24/2015  . Leukocytosis 11/24/2015  . GI bleed 11/24/2015  . Hypertension 05/05/2012  . Diabetes mellitus (HCC) 05/05/2012  . Chronic low back pain 05/05/2012  . Obesity 05/05/2012     Past Surgical History:  Procedure Laterality Date  . COLONOSCOPY WITH PROPOFOL N/A 08/10/2020   Procedure: COLONOSCOPY WITH PROPOFOL;  Surgeon: Dolores Frameastaneda Mayorga, Daniel, MD;  Location: [facility];  Service: Gastroenterology;  Laterality: N/A;  11:15  . ESOPHAGOGASTRODUODENOSCOPY (EGD) WITH PROPOFOL N/A 11/25/2015   Procedure: ESOPHAGOGASTRODUODENOSCOPY (EGD) WITH PROPOFOL;  Surgeon: Malissa HippoNajeeb U Rehman, MD;  Location: [facility];  Service: Endoscopy;  Laterality: N/A;  . KIDNEY STONE SURGERY      Prior to Admission medications   Medication Sig Start Date End Date Taking? Authorizing Provider  chlordiazePOXIDE (LIBRIUM) 25 MG capsule Take 1 capsule (25 mg total) by mouth 3 (three) times daily as needed for anxiety. Take 50 mg twice daily on day 1, 25 mg twice daily on day 2, 25 10/09/20  Yes Nakiyah Beverley, Delorise RoyalsJonathan D, PA-C  ondansetron (ZOFRAN-ODT) 4 MG disintegrating tablet Take 1 tablet (4 mg total) by mouth every 8 (eight) hours as needed for nausea or vomiting. 10/09/20  Yes Neema Barreira, Delorise RoyalsJonathan D, PA-C  thiamine 100 MG tablet Take 1 tablet (100 mg total) by mouth daily for 1 day. 10/09/20 10/10/20 Yes Rodrigues Urbanek, Delorise RoyalsJonathan D, PA-C  alfuzosin (UROXATRAL) 10 MG 24 hr tablet Take 1 tablet (10 mg total) by mouth daily with breakfast. 08/31/20   McKenzie, Mardene CelestePatrick L, MD  dicyclomine (BENTYL) 10 MG capsule Take 1 capsule (10 mg total) by mouth 2 (two) times daily before a meal. 07/28/20   Marguerita Merlesastaneda Mayorga, Reuel Boomaniel, MD  gabapentin (NEURONTIN) 300 MG capsule TAKE 1 CAPSULE BY MOUTH AT BEDTIME FOR 5 DAYS THEN INCREASE TO 1 CAPSULE TWICE DAILY FOR 5 DAYS THEN INCREASE TO 1 CAPSULE THREE TIMES DAILY 02/04/19   [provider]  gabapentin (NEURONTIN) 300 MG capsule Take 1 capsule (300 mg total) by mouth 3 (three) times daily for 5 days. 02/29/20 03/05/20  Willy Eddyobinson, Patrick, MD  glipiZIDE (GLUCOTROL) 10 MG tablet Take 2 tablets (20 mg total) by mouth daily for 5 days. 02/29/20 03/05/20  Willy Eddyobinson, Patrick, MD   glipiZIDE (GLUCOTROL) 10 MG tablet Take 10 mg by mouth daily before breakfast.    [provider]  HYDROmorphone (DILAUDID) 4 MG tablet Take 1 tablet (4 mg total) by mouth every 4 (four) hours as needed for moderate pain or severe pain. 09/27/20   McKenzie, Mardene CelestePatrick L, MD  lisinopril-hydrochlorothiazide (ZESTORETIC) 20-25 MG tablet Take 1 tablet by mouth daily for 5 days. 02/29/20 03/05/20  Willy Eddyobinson, Patrick, MD  lisinopril-hydrochlorothiazide (ZESTORETIC) 20-25 MG tablet Take 1 tablet by mouth in the morning and at bedtime.    [provider]  metFORMIN (GLUCOPHAGE) 500 MG tablet Take 1 tablet (500 mg total) by mouth 2 (two) times daily with a meal for 5 days. 02/29/20 03/05/20  Willy Eddyobinson, Patrick, MD  metFORMIN (GLUCOPHAGE) 500 MG tablet Take 1,000 mg by mouth 2 (two) times daily with a meal.    [provider]  methocarbamol (ROBAXIN) 500 MG tablet Take 1 tablet (500 mg total) by mouth 4 (four) times daily.  05/11/20   Anabell Swint, Delorise Royals, PA-C  oxycodone (OXY-IR) 5 MG capsule Take 5 mg by mouth every 4 (four) hours as needed.    [provider]  pantoprazole (PROTONIX) 40 MG tablet Take 1 tablet (40 mg total) by mouth daily before breakfast for 5 days. 02/29/20 03/05/20  Willy Eddy, MD  predniSONE (DELTASONE) 10 MG tablet Take 6 tablets  today, on day 2 take 5 tablets, day 3 take 4 tablets, day 4 take 3 tablets, day 5 take  2 tablets and 1 tablet the last day 08/11/20   Tommi Rumps, PA-C    Allergies Fish allergy  Family History  Problem Relation Age of Onset  . Heart disease Mother   . Heart disease Father   . Healthy Sister   . Hypertension Brother   . Hypertension Brother   . Hypertension Brother   . Diabetes Brother     Social History Social History   Tobacco Use  . Smoking status: Never Smoker  . Smokeless tobacco: Never Used  Vaping Use  . Vaping Use: Never used  Substance Use Topics  . Alcohol use: Not Currently  . Drug use: No      Review of Systems  Constitutional: No fever/chills.  Positive for anxiousness, tremors, reports that he is likely withdrawing from alcohol as he drinks "tremendous amount of alcohol."  Last drink was last night. Eyes: No visual changes. No discharge ENT: No upper respiratory complaints. Cardiovascular: no chest pain. Respiratory: no cough. No SOB. Gastrointestinal: No abdominal pain.  Positive for nausea, dry heaves with some mild emesis starting this morning after stopping alcohol..  No diarrhea.  No constipation. Genitourinary: Negative for dysuria.  Positive for bilateral flank pain worse on left than right.  Positive for hematuria.  Reports decreased urinary output over the last 1 to 2 days. Musculoskeletal: Negative for musculoskeletal pain. Skin: Negative for rash, abrasions, lacerations, ecchymosis. Neurological: Negative for headaches, focal weakness or numbness.  10 System ROS otherwise negative.  ____________________________________________   PHYSICAL EXAM:  VITAL SIGNS: ED Triage Vitals  Enc Vitals Group     BP 10/09/20 1201 (!) 154/88     Pulse Rate 10/09/20 1201 94     Resp 10/09/20 1201 (!) 22     Temp 10/09/20 1201 97.8 F (36.6 C)     Temp Source 10/09/20 1201 Oral     SpO2 10/09/20 1201 95 %     Weight 10/09/20 1158 225 lb (102.1 kg)     Height 10/09/20 1158 5\' 10"  (1.778 m)     Head Circumference --      Peak Flow --      Pain Score 10/09/20 1158 10     Pain Loc --      Pain Edu? --      Excl. in GC? --      Constitutional: Alert and oriented. Well appearing and in no acute distress. Eyes: Conjunctivae are normal. PERRL. EOMI. Head: Atraumatic. ENT:      Ears:       Nose: No congestion/rhinnorhea.      Mouth/Throat: Mucous membranes are moist.  Neck: No stridor.    Cardiovascular: Normal rate, regular rhythm. Normal S1 and S2.  Good peripheral circulation. Respiratory: Normal respiratory effort without tachypnea or retractions. Lungs CTAB.  Good air entry to the bases with no decreased or absent breath sounds. Gastrointestinal: Bowel sounds 4 quadrants. Soft and nontender to palpation. No guarding or rigidity. No palpable masses. No distention.  Bilateral  CVA tenderness, worse on the left than right. Musculoskeletal: Full range of motion to all extremities. No gross deformities appreciated. Neurologic:  Normal speech and language. No gross focal neurologic deficits are appreciated.  Skin:  Skin is warm, dry and intact. No rash noted. Psychiatric: Mood and affect are normal. Speech and behavior are normal. Patient exhibits appropriate insight and judgement.   ____________________________________________   LABS (all labs ordered are listed, but only abnormal results are displayed)  Labs Reviewed  COMPREHENSIVE METABOLIC PANEL - Abnormal; Notable for the following components:      Result Value   Sodium 134 (*)    Chloride 97 (*)    CO2 19 (*)    Glucose, Bld 156 (*)    Calcium 8.7 (*)    Total Protein 6.4 (*)    AST 150 (*)    ALT 115 (*)    Total Bilirubin 2.1 (*)    Anion gap 18 (*)    All other components within normal limits  SALICYLATE LEVEL - Abnormal; Notable for the following components:   Salicylate Lvl <7.0 (*)    All other components within normal limits  ACETAMINOPHEN LEVEL - Abnormal; Notable for the following components:   Acetaminophen (Tylenol), Serum <10 (*)    All other components within normal limits  URINE DRUG SCREEN, QUALITATIVE ([facility] ONLY) - Abnormal; Notable for the following components:   Opiate, Ur Screen POSITIVE (*)    All other components within normal limits  URINALYSIS, COMPLETE (UACMP) WITH MICROSCOPIC - Abnormal; Notable for the following components:   Color, Urine AMBER (*)    APPearance CLEAR (*)    Glucose, UA 50 (*)    Hgb urine dipstick SMALL (*)    Ketones, ur 20 (*)    Protein, ur 30 (*)    All other components within normal limits  ETHANOL  CBC    ____________________________________________  EKG   ____________________________________________  RADIOLOGY I personally viewed and evaluated these images as part of my medical decision making, as well as reviewing the written report by the radiologist.  ED Provider Interpretation: Bilateral nephrolithiasis is appreciated.  No evidence of nephrolithiasis and ureters or evidence of hydronephrosis concerning for obstruction.  CT Renal Stone Study  Result Date: 10/09/2020 CLINICAL DATA:  Pain. EXAM: CT ABDOMEN AND PELVIS WITHOUT CONTRAST TECHNIQUE: Multidetector CT imaging of the abdomen and pelvis was performed following the standard protocol without IV contrast. COMPARISON:  July 28, 2020 FINDINGS: Lower chest: No acute abnormality. Hepatobiliary: Evaluation for liver masses is limited due the lack of contrast. No discrete masses identified on this unenhanced study. The gallbladder is distended which is new in the interval but there is no adjacent fat stranding. The portal vein could not be assessed. Pancreas: Unremarkable. No pancreatic ductal dilatation or surrounding inflammatory changes. Spleen: Normal in size without focal abnormality. Adrenals/Urinary Tract: Bilateral renal stones are identified. The largest is in a right lower pole calyx measuring 7 mm. No hydronephrosis. Mild bilateral perinephric stranding is nonacute. Ureters are normal with no obstruction or stones. The bladder is unremarkable. Stomach/Bowel: Stomach is within normal limits. Appendix appears normal. No evidence of bowel wall thickening, distention, or inflammatory changes. Vascular/Lymphatic: No significant vascular findings are present. No enlarged abdominal or pelvic lymph nodes. Reproductive: Prostate is unremarkable. Other: No abdominal wall hernia or abnormality. No abdominopelvic ascites. Musculoskeletal: No acute or significant osseous findings. IMPRESSION: 1. The gallbladder is distended which is new in the  interval but there is no adjacent fat stranding.  If there is concern for acute cholecystitis, recommend ultrasound for further evaluation. 2. Nonobstructive stones in both kidneys. Electronically Signed   By: Gerome Sam III M.D   On: 10/09/2020 14:31    ____________________________________________    PROCEDURES  Procedure(s) performed:    Procedures    Medications  ondansetron [facility]) injection 4 mg (4 mg Intravenous Given 10/09/20 1224)  sodium chloride 0.9 % bolus 1,000 mL (0 mLs Intravenous Stopped 10/09/20 1420)  LORazepam (ATIVAN) injection 2 mg (2 mg Intravenous Given 10/09/20 1257)  chlordiazePOXIDE (LIBRIUM) capsule 50 mg (50 mg Oral Given 10/09/20 1551)     ____________________________________________   INITIAL IMPRESSION / ASSESSMENT AND PLAN / ED COURSE  Pertinent labs & imaging results that were available during my care of the patient were reviewed by me and considered in my medical decision making (see chart for details).  Review of the [place] CSRS was performed in accordance of the [facility] prior to dispensing any controlled drugs.           Patient's diagnosis is consistent with nephrolithiasis, alcohol withdrawal.  Patient presented to emergency department for multiple complaints.  Patient was primarily concerned that he may have accidentally overdosed on oxycodone.  Patient has been scribed oxycodone for his flank pain from known kidney stones.  Patient is scheduled to have percutaneous nephrolithotomy in 8 days.  Patient had an increase in his flank pain, took 2 doses of his pain medicine within an hour.  Patient is positive for opiates on his drug screen which is consistent with his history but patient had no evidence of overdose by physical exam.  Patient had not received any medications by EMS such as Narcan prior to his arrival either.  Again this was unintentional and he has no suicidal or homicidal ideations.  Patient states that he drinks alcohol copiously and  states that he is drinking what appears to be 1 L bottles of alcohol at 10% alcohol by volume.  Patient reports that he drinks 12-14 of these a day.  Patient stopped drinking yesterday and believes that he is having some withdrawal symptoms today.  Patient does have a history of alcoholism, has gone through withdrawals and had stopped alcohol use for some period of time.  Patient states that he slowly returned to drinking alcohol and over the past month has been drinking significant amounts of alcohol as described above.  We discussed the patient's withdrawal symptoms and he does have a CIWA score of 26.  Patient was advised that I would talk to the hospitalist service about admitting him for the CIWA protocol but he only wanted treatment for this evening.  I advised that we would not admit him to the hospitalist service if he only wanted a few hours of medications.  Patient states that he would attempt to continue his cessation of alcohol consumption at home and I will prescribe Librium for same.  First dose is administered here.  Patient will have a dose of thiamine and antiemetics at home.  Patient is nephrolithiasis is stable with no evidence of obstruction and again he will follow up with urology for percutaneous removal in 8 days.  Return precautions discussed with the patient..  Patient is given ED precautions to return to the ED for any worsening or new symptoms.     ____________________________________________  FINAL CLINICAL IMPRESSION(S) / ED DIAGNOSES  Final diagnoses:  Nephrolithiasis  Alcohol withdrawal syndrome without complication (HCC)      NEW MEDICATIONS STARTED DURING THIS VISIT:  ED Discharge Orders         Ordered    chlordiazePOXIDE (LIBRIUM) 25 MG capsule  3 times daily PRN        10/09/20 1532    thiamine 100 MG tablet  Daily        10/09/20 1532    ondansetron (ZOFRAN-ODT) 4 MG disintegrating tablet  Every 8 hours PRN        10/09/20 1532              This  chart was dictated using voice recognition software/Dragon. Despite best efforts to proofread, errors can occur which can change the meaning. Any change was purely unintentional.    Racheal Patches, PA-C 10/09/20 1934    Sharman Cheek, MD 10/10/20 [phone]

## 2020-10-09 NOTE — ED Triage Notes (Signed)
Pt to ED via EMS, states "I drank a tremendous amount of alcohol", pt endorses drank alcohol yesterday. Pt states stopped drinking last night. Pt states took oxycodone last night and again this morning. Pt appears anxious in triage. Pt A&O x4, ambulatory without difficulty. Pt also c/o pain.   Pt states known kidney stones, c/o 10/10 back pain from the kidney stones.   Pt denies SI/HI, states was not trying to hurt himself.   Pt also with noted tremors, appears anxious in triage, appears flushed on assessment. Pt endorses daily drinking, "here lately". Pt states last drink was last night, unable to state what time.

## 2020-10-09 NOTE — ED Triage Notes (Signed)
Pt in via EMS from the fire station. Pt walked over to see them because he took to many of his oxy. Pt took 10mg  an hour ago and then another 10mg  at a time he cannot remember and began to feel flushed. #18 g to left hand, pt was given some fluid, 148/85, 98% RA

## 2020-10-09 NOTE — ED Notes (Signed)
Pt ambulated to restroom at this time without difficulty. Urine sample obtained and sent to lab at this time.

## 2020-10-12 NOTE — Patient Instructions (Signed)
Gregory Duran  10/12/2020     @PREFPERIOPPHARMACY @   Your procedure is scheduled on  10/17/2020.  Report to 10/19/2020 at  0700  A.M.  Call this number if you have problems the morning of surgery:  [phone]   Remember:  Do not eat or drink after midnight.                          Take these medicines the morning of surgery with A SIP OF WATER  Uroxatrol, librium, gabapentin, dilaudid or oxy IR, robaxin, zofran(if needed), protonix.    Do not wear jewelry, make-up or nail polish.  Do not wear lotions, powders, or perfumes, or deodorant. Please brush your teeth.  Do not shave 48 hours prior to surgery.  Men may shave face and neck.  Do not bring valuables to the hospital.  [facility] is not responsible for any belongings or valuables.  Contacts, dentures or bridgework may not be worn into surgery.  Leave your suitcase in the car.  After surgery it may be brought to your room.  For patients admitted to the hospital, discharge time will be determined by your treatment team.  Patients discharged the day of surgery will not be allowed to drive home.   Name and phone number of your driver:   family Special instructions:  DO NOT smoke the morning of your procedure.  Please read over the following fact sheets that you were given. Anesthesia Post-op Instructions and Care and Recovery After Surgery       Ureteral Stent Implantation, Care After This sheet gives you information about how to care for yourself after your procedure. Your health care provider may also give you more specific instructions. If you have problems or questions, contact your health care provider. What can I expect after the procedure? After the procedure, it is common to have:  Nausea.  Mild pain when you urinate. You may feel this pain in your lower back or lower abdomen. The pain should stop within a few minutes after you urinate. This may last for up to 1 week.  A small amount of blood in  your urine for several days. Follow these instructions at home: Medicines  Take over-the-counter and prescription medicines only as told by your health care provider.  If you were prescribed an antibiotic medicine, take it as told by your health care provider. Do not stop taking the antibiotic even if you start to feel better.  Do not drive for 24 hours if you were given a sedative during your procedure.  Ask your health care provider if the medicine prescribed to you requires you to avoid driving or using heavy machinery. Activity  Rest as told by your health care provider.  Avoid sitting for a long time without moving. Get up to take short walks every 1-2 hours. This is important to improve blood flow and breathing. Ask for help if you feel weak or unsteady.  Return to your normal activities as told by your health care provider. Ask your health care provider what activities are safe for you. General instructions  Watch for any blood in your urine. Call your health care provider if the amount of blood in your urine increases.  If you have a catheter: ? Follow instructions from your health care provider about taking care of your catheter and collection bag. ? Do not take baths, swim, or use a hot tub  until your health care provider approves. Ask your health care provider if you may take showers. You may only be allowed to take sponge baths.  Drink enough fluid to keep your urine pale yellow.  Do not use any products that contain nicotine or tobacco, such as cigarettes, e-cigarettes, and chewing tobacco. These can delay healing after surgery. If you need help quitting, ask your health care provider.  Keep all follow-up visits as told by your health care provider. This is important.   Contact a health care provider if:  You have pain that gets worse or does not get better with medicine, especially pain when you urinate.  You have difficulty urinating.  You feel nauseous or you  vomit repeatedly during a period of more than 2 days after the procedure. Get help right away if:  Your urine is dark red or has blood clots in it.  You are leaking urine (have incontinence).  The end of the stent comes out of your urethra.  You cannot urinate.  You have sudden, sharp, or severe pain in your abdomen or lower back.  You have a fever.  You have swelling or pain in your legs.  You have difficulty breathing. Summary  After the procedure, it is common to have mild pain when you urinate that goes away within a few minutes after you urinate. This may last for up to 1 week.  Watch for any blood in your urine. Call your health care provider if the amount of blood in your urine increases.  Take over-the-counter and prescription medicines only as told by your health care provider.  Drink enough fluid to keep your urine pale yellow. This information is not intended to replace advice given to you by your health care provider. Make sure you discuss any questions you have with your health care provider. Document Revised: 06/24/2018 Document Reviewed: 06/25/2018 Elsevier Patient Education  2021 [employer] Monitored Anesthesia Care, Care After This sheet gives you information about how to care for yourself after your procedure. Your health care provider may also give you more specific instructions. If you have problems or questions, contact your health care provider. What can I expect after the procedure? After the procedure, it is common to have:  Tiredness.  Forgetfulness about what happened after the procedure.  Impaired judgment for important decisions.  Nausea or vomiting.  Some difficulty with balance. Follow these instructions at home: For the time period you were told by your health care provider:  Rest as needed.  Do not participate in activities where you could fall or become injured.  Do not drive or use machinery.  Do not drink alcohol.  Do not  take sleeping pills or medicines that cause drowsiness.  Do not make important decisions or sign legal documents.  Do not take care of children on your own.      Eating and drinking  Follow the diet that is recommended by your health care provider.  Drink enough fluid to keep your urine pale yellow.  If you vomit: ? Drink water, juice, or soup when you can drink without vomiting. ? Make sure you have little or no nausea before eating solid foods. General instructions  Have a responsible adult stay with you for the time you are told. It is important to have someone help care for you until you are awake and alert.  Take over-the-counter and prescription medicines only as told by your health care provider.  If you have sleep apnea, surgery  and certain medicines can increase your risk for breathing problems. Follow instructions from your health care provider about wearing your sleep device: ? Anytime you are sleeping, including during daytime naps. ? While taking prescription pain medicines, sleeping medicines, or medicines that make you drowsy.  Avoid smoking.  Keep all follow-up visits as told by your health care provider. This is important. Contact a health care provider if:  You keep feeling nauseous or you keep vomiting.  You feel light-headed.  You are still sleepy or having trouble with balance after 24 hours.  You develop a rash.  You have a fever.  You have redness or swelling around the IV site. Get help right away if:  You have trouble breathing.  You have new-onset confusion at home. Summary  For several hours after your procedure, you may feel tired. You may also be forgetful and have poor judgment.  Have a responsible adult stay with you for the time you are told. It is important to have someone help care for you until you are awake and alert.  Rest as told. Do not drive or operate machinery. Do not drink alcohol or take sleeping pills.  Get help right  away if you have trouble breathing, or if you suddenly become confused. This information is not intended to replace advice given to you by your health care provider. Make sure you discuss any questions you have with your health care provider. Document Revised: 06/02/2020 Document Reviewed: 08/20/2019 Elsevier Patient Education  2021 [employer].

## 2020-10-13 ENCOUNTER — Encounter (HOSPITAL_COMMUNITY): Payer: Self-pay | Admitting: Anesthesiology

## 2020-10-14 ENCOUNTER — Other Ambulatory Visit (HOSPITAL_COMMUNITY)
Admission: RE | Admit: 2020-10-14 | Discharge: 2020-10-14 | Disposition: A | Discharge status: Home / Self Care | Payer: Medicaid Other | Source: Ambulatory Visit | Attending: Urology | Admitting: Urology

## 2020-10-14 ENCOUNTER — Encounter (HOSPITAL_COMMUNITY): Payer: Self-pay

## 2020-10-14 ENCOUNTER — Encounter (HOSPITAL_COMMUNITY)
Admission: RE | Admit: 2020-10-14 | Discharge: 2020-10-14 | Disposition: A | Discharge status: Home / Self Care | Payer: Medicaid Other | Source: Ambulatory Visit | Attending: Urology | Admitting: Urology

## 2020-10-14 ENCOUNTER — Other Ambulatory Visit: Payer: Self-pay

## 2020-10-14 DIAGNOSIS — Z20822 Contact with and (suspected) exposure to covid-19: Secondary | ICD-10-CM | POA: Diagnosis not present

## 2020-10-14 DIAGNOSIS — Z01812 Encounter for preprocedural laboratory examination: Secondary | ICD-10-CM | POA: Diagnosis present

## 2020-10-14 HISTORY — DX: Spinal stenosis, site unspecified: M48.00

## 2020-10-14 HISTORY — DX: Anxiety disorder, unspecified: F41.9

## 2020-10-14 HISTORY — DX: Scoliosis, unspecified: M41.9

## 2020-10-14 HISTORY — DX: Sleep apnea, unspecified: G47.30

## 2020-10-14 LAB — SARS CORONAVIRUS 2 (TAT 6-24 HRS): SARS Coronavirus 2: NEGATIVE

## 2020-10-14 LAB — PROTIME-INR
INR: 0.9 (ref 0.8–1.2)
Prothrombin Time: 11.8 seconds (ref 11.4–15.2)

## 2020-10-14 LAB — HEMOGLOBIN A1C
Hgb A1c MFr Bld: 5.9 % — ABNORMAL HIGH (ref 4.8–5.6)
Mean Plasma Glucose: 122.63 mg/dL

## 2020-10-17 DIAGNOSIS — N200 Calculus of kidney: Secondary | ICD-10-CM

## 2020-10-21 ENCOUNTER — Other Ambulatory Visit: Payer: Medicaid Other

## 2020-10-24 ENCOUNTER — Other Ambulatory Visit: Payer: Medicaid Other | Admitting: Urology

## 2020-10-27 ENCOUNTER — Other Ambulatory Visit: Payer: Self-pay

## 2020-10-27 ENCOUNTER — Encounter (HOSPITAL_COMMUNITY)
Admission: RE | Admit: 2020-10-27 | Discharge: 2020-10-27 | Disposition: A | Discharge status: Home / Self Care | Payer: Medicaid Other | Source: Ambulatory Visit | Attending: Urology | Admitting: Urology

## 2020-10-31 ENCOUNTER — Other Ambulatory Visit (HOSPITAL_COMMUNITY): Payer: Medicaid Other

## 2020-11-01 DIAGNOSIS — N200 Calculus of kidney: Secondary | ICD-10-CM

## 2020-11-09 ENCOUNTER — Other Ambulatory Visit: Payer: Medicaid Other | Admitting: Urology

## 2020-11-10 ENCOUNTER — Emergency Department: Payer: Medicaid Other

## 2020-11-10 ENCOUNTER — Emergency Department
Admission: EM | Admit: 2020-11-10 | Discharge: 2020-11-10 | Disposition: A | Discharge status: Home / Self Care | Payer: Medicaid Other | Attending: Student in an Organized Health Care Education/Training Program | Admitting: Student in an Organized Health Care Education/Training Program

## 2020-11-10 ENCOUNTER — Other Ambulatory Visit: Payer: Self-pay

## 2020-11-10 DIAGNOSIS — M545 Low back pain, unspecified: Secondary | ICD-10-CM | POA: Diagnosis present

## 2020-11-10 DIAGNOSIS — I10 Essential (primary) hypertension: Secondary | ICD-10-CM | POA: Diagnosis not present

## 2020-11-10 DIAGNOSIS — E1169 Type 2 diabetes mellitus with other specified complication: Secondary | ICD-10-CM | POA: Insufficient documentation

## 2020-11-10 DIAGNOSIS — F10929 Alcohol use, unspecified with intoxication, unspecified: Secondary | ICD-10-CM | POA: Diagnosis not present

## 2020-11-10 DIAGNOSIS — Z79899 Other long term (current) drug therapy: Secondary | ICD-10-CM | POA: Diagnosis not present

## 2020-11-10 DIAGNOSIS — E119 Type 2 diabetes mellitus without complications: Secondary | ICD-10-CM | POA: Diagnosis not present

## 2020-11-10 DIAGNOSIS — M5440 Lumbago with sciatica, unspecified side: Secondary | ICD-10-CM

## 2020-11-10 DIAGNOSIS — G8929 Other chronic pain: Secondary | ICD-10-CM | POA: Diagnosis not present

## 2020-11-10 DIAGNOSIS — F10920 Alcohol use, unspecified with intoxication, uncomplicated: Secondary | ICD-10-CM

## 2020-11-10 DIAGNOSIS — Z7984 Long term (current) use of oral hypoglycemic drugs: Secondary | ICD-10-CM | POA: Insufficient documentation

## 2020-11-10 LAB — CBC WITH DIFFERENTIAL/PLATELET
Abs Immature Granulocytes: 0.02 10*3/uL (ref 0.00–0.07)
Basophils Absolute: 0 10*3/uL (ref 0.0–0.1)
Basophils Relative: 1 %
Eosinophils Absolute: 0.1 10*3/uL (ref 0.0–0.5)
Eosinophils Relative: 2 %
HCT: 39.2 % (ref 39.0–52.0)
Hemoglobin: 13.2 g/dL (ref 13.0–17.0)
Immature Granulocytes: 0 %
Lymphocytes Relative: 23 %
Lymphs Abs: 1.3 10*3/uL (ref 0.7–4.0)
MCH: 30.4 pg (ref 26.0–34.0)
MCHC: 33.7 g/dL (ref 30.0–36.0)
MCV: 90.3 fL (ref 80.0–100.0)
Monocytes Absolute: 0.6 10*3/uL (ref 0.1–1.0)
Monocytes Relative: 10 %
Neutro Abs: 3.6 10*3/uL (ref 1.7–7.7)
Neutrophils Relative %: 64 %
Platelets: 175 10*3/uL (ref 150–400)
RBC: 4.34 MIL/uL (ref 4.22–5.81)
RDW: 14.9 % (ref 11.5–15.5)
WBC: 5.7 10*3/uL (ref 4.0–10.5)
nRBC: 0 % (ref 0.0–0.2)

## 2020-11-10 LAB — COMPREHENSIVE METABOLIC PANEL
ALT: 26 U/L (ref 0–44)
AST: 38 U/L (ref 15–41)
Albumin: 3.5 g/dL (ref 3.5–5.0)
Alkaline Phosphatase: 45 U/L (ref 38–126)
Anion gap: 14 (ref 5–15)
BUN: 9 mg/dL (ref 6–20)
CO2: 24 mmol/L (ref 22–32)
Calcium: 8.1 mg/dL — ABNORMAL LOW (ref 8.9–10.3)
Chloride: 101 mmol/L (ref 98–111)
Creatinine, Ser: 0.68 mg/dL (ref 0.61–1.24)
GFR, Estimated: >60 mL/min (ref >60)
Glucose, Bld: 219 mg/dL — ABNORMAL HIGH (ref 70–99)
Potassium: 3.9 mmol/L (ref 3.5–5.1)
Sodium: 139 mmol/L (ref 135–145)
Total Bilirubin: 0.6 mg/dL (ref 0.3–1.2)
Total Protein: 6.4 g/dL — ABNORMAL LOW (ref 6.5–8.1)

## 2020-11-10 LAB — ETHANOL: Alcohol, Ethyl (B): 296 mg/dL — ABNORMAL HIGH (ref <10)

## 2020-11-10 MED ORDER — KETOROLAC TROMETHAMINE 30 MG/ML IJ SOLN
30.0000 mg | Freq: Once | INTRAMUSCULAR | Status: AC
  Administered 2020-11-10: 30 mg via INTRAVENOUS
  Filled 2020-11-10: qty 1

## 2020-11-10 MED ORDER — LIDOCAINE 5 % EX PTCH
1.0000 | MEDICATED_PATCH | CUTANEOUS | Status: DC
  Administered 2020-11-10: 1 via TRANSDERMAL
  Filled 2020-11-10: qty 1

## 2020-11-10 MED ORDER — SODIUM CHLORIDE 0.9 % IV BOLUS
1000.0000 mL | Freq: Once | INTRAVENOUS | Status: AC
  Administered 2020-11-10: 1000 mL via INTRAVENOUS

## 2020-11-10 NOTE — ED Notes (Signed)
Patient transported to X-ray 

## 2020-11-10 NOTE — ED Provider Notes (Signed)
[facility] Provider Note  ____________________________________________   Event Date/Time   First MD Initiated Contact with Patient 11/10/20 1109     (approximate)  I have reviewed the triage vital signs and the nursing notes.   HISTORY  Chief Complaint No chief complaint on file.    HPI PARTHIV MUCCI is a 60 y.o. male  C/o low back pain for for several days, patient has a history of scoliosis, no known injury, pain is worse with movement, increased with bending over, denies numbness, tingling, or changes in bowel/urinary habits, did not use any OTC medicines.  Patient's been drinking since yesterday.  States "I do not think  I have had as much as you will think " remainder ros neg   Past Medical History:  Diagnosis Date  . Anxiety   . Chronic back pain   . Chronic shoulder pain   . Diabetes mellitus (HCC)   . Hepatitis C    Patient states that he does not have hep C.  Will bring test results.  . Hypertension   . Sciatica   . Scoliosis   . Sleep apnea   . Spinal stenosis     Patient Active Problem List   Diagnosis Date Noted  . Nephrolithiasis 08/31/2020  . Abdominal mass 07/18/2020  . History of hepatitis C 07/18/2020  . Ear fullness, bilateral 07/18/2020  . GERD (gastroesophageal reflux disease) 06/29/2019  . Gastroesophageal reflux disease with esophagitis   . Acute blood loss anemia   . Controlled type 2 diabetes mellitus with complication, without long-term current use of insulin (HCC)   . Severe dehydration 11/24/2015  . AKI (acute kidney injury) (HCC) 11/24/2015  . Melena 11/24/2015  . Hyponatremia 11/24/2015  . Leukocytosis 11/24/2015  . GI bleed 11/24/2015  . Hypertension 05/05/2012  . Diabetes mellitus (HCC) 05/05/2012  . Chronic low back pain 05/05/2012  . Obesity 05/05/2012    Past Surgical History:  Procedure Laterality Date  . COLONOSCOPY WITH PROPOFOL N/A 08/10/2020   Procedure: COLONOSCOPY  WITH PROPOFOL;  Surgeon: Dolores Frame, MD;  Location: [facility];  Service: Gastroenterology;  Laterality: N/A;  11:15  . ESOPHAGOGASTRODUODENOSCOPY (EGD) WITH PROPOFOL N/A 11/25/2015   Procedure: ESOPHAGOGASTRODUODENOSCOPY (EGD) WITH PROPOFOL;  Surgeon: Malissa Hippo, MD;  Location: [facility];  Service: Endoscopy;  Laterality: N/A;  . KIDNEY STONE SURGERY      Prior to Admission medications   Medication Sig Start Date End Date Taking? Authorizing Provider  gabapentin (NEURONTIN) 300 MG capsule Take 1 capsule (300 mg total) by mouth 3 (three) times daily for 5 days. 02/29/20 03/05/20  Willy Eddy, MD  metFORMIN (GLUCOPHAGE-XR) 500 MG 24 hr tablet Take 1,000 mg by mouth 2 (two) times daily.    [provider]  Oxycodone HCl 10 MG TABS Take 10 mg by mouth 4 (four) times daily as needed (pain).    [provider]  pantoprazole (PROTONIX) 40 MG tablet Take 1 tablet (40 mg total) by mouth daily before breakfast for 5 days. 02/29/20 03/05/20  Willy Eddy, MD  dicyclomine (BENTYL) 10 MG capsule Take 1 capsule (10 mg total) by mouth 2 (two) times daily before a meal. Patient not taking: Reported on 10/12/2020 07/28/20 11/10/20  Dolores Frame, MD  glipiZIDE (GLUCOTROL) 10 MG tablet Take 2 tablets (20 mg total) by mouth daily for 5 days. Patient taking differently: Take 10 mg by mouth 2 (two) times daily. 02/29/20 11/10/20  Willy Eddy, MD  lisinopril-hydrochlorothiazide (ZESTORETIC) 20-25 MG tablet Take 1 tablet by mouth daily for 5 days. Patient taking differently: Take 1 tablet by mouth 2 (two) times daily. 02/29/20 11/10/20  Willy Eddy, MD    Allergies Fish allergy  Family History  Problem Relation Age of Onset  . Heart disease Mother   . Heart disease Father   . Healthy Sister   . Hypertension Brother   . Hypertension Brother   . Hypertension Brother   . Diabetes Brother     Social History Social History   Tobacco Use  .  Smoking status: Never Smoker  . Smokeless tobacco: Never Used  Vaping Use  . Vaping Use: Never used  Substance Use Topics  . Alcohol use: Not Currently  . Drug use: No    Review of Systems  Constitutional: No fever/chills Eyes: No visual changes. ENT: No sore throat. Respiratory: Denies cough Genitourinary: Negative for dysuria. Musculoskeletal: Positive for back pain. Skin: Negative for rash.    ____________________________________________   PHYSICAL EXAM:  VITAL SIGNS: ED Triage Vitals  Enc Vitals Group     BP 11/10/20 1111 (!) 155/97     Pulse Rate 11/10/20 1111 96     Resp --      Temp 11/10/20 1111 97.6 F (36.4 C)     Temp Source 11/10/20 1111 Oral     SpO2 11/10/20 1111 96 %     Weight --      Height --      Head Circumference --      Peak Flow --      Pain Score 11/10/20 1059 10     Pain Loc --      Pain Edu? --      Excl. in GC? --    Exam is limited due to patient's EtOH intake Constitutional: Alert and oriented. Well appearing and in no acute distress.  Patient smells of EtOH Eyes: Conjunctivae are normal.  Head: Atraumatic. Nose: No congestion/rhinnorhea. Mouth/Throat: Mucous membranes are moist.  Neck:  supple no lymphadenopathy noted Cardiovascular: Normal rate, regular rhythm. Heart sounds are normal Respiratory: Normal respiratory effort.  No retractions, lungs c t a  Abd: soft nontender bs normal all 4 quad GU: deferred Musculoskeletal: FROM all extremities, warm and well perfused.  Decreased rom of back due to discomfort, lumbar spine tender, negative slr, 5/5 strength in great toes b/l, 5/5 strength in lower legs, n/v intact Neurologic:  Normal speech and language.  Skin:  Skin is warm, dry and intact. No rash noted. Psychiatric: Mood and affect are normal. Speech and behavior are normal.  ____________________________________________   LABS (all labs ordered are listed, but only abnormal results are displayed)  Labs Reviewed   COMPREHENSIVE METABOLIC PANEL - Abnormal; Notable for the following components:      Result Value   Glucose, Bld 219 (*)    Calcium 8.1 (*)    Total Protein 6.4 (*)    All other components within normal limits  ETHANOL - Abnormal; Notable for the following components:   Alcohol, Ethyl (B) 296 (*)    All other components within normal limits  CBC WITH DIFFERENTIAL/PLATELET   ____________________________________________   ____________________________________________  RADIOLOGY  X-ray lumbar spine shows chronic changes  ____________________________________________   PROCEDURES  Procedure(s) performed: No  Procedures    ____________________________________________   INITIAL IMPRESSION / ASSESSMENT AND PLAN / ED COURSE  Pertinent labs & imaging results that were available during my care of the patient were reviewed by me and  considered in my medical decision making (see chart for details).   Patient is a 60 year old male with history of diabetes and scoliosis presents with low back pain.  See HPI.  Physical exam shows patient to have a odor of EtOH on his breath.  Lumbar spines tender.  Remainder exams unremarkable  DDx: EtOH intoxication, lumbar strain, chronic back pain  CBC, metabolic panel, EtOH, Toradol 30 mg IV, normal saline 1 L IV  X-ray which was reviewed by me and confirmed by radiology shows degenerative changes only.  Clinical Course as of 11/10/20 1332  Thu Nov 10, 2020  1257 Ethanol [SF]    Clinical Course User Index [SF] Faythe Ghee, PA-C   CBC is normal, comprehensive metabolic panel shows elevated glucose 219, patient was given 1 L normal saline to help correct glucose level, patient's EtOH level was 296.  We hope that the IV fluids of help lower that level also.  Patient is ready to leave.  States he is in trouble with his chronic pain doctor for his drinking.  I did offer detox.  He states he wants to go home and think about it.  States he  might come back to talk to me about it later.  I feel he is in stable condition at this time.  return as needed.  No prescriptions were given to the patient.  Lidoderm patch was applied prior to discharge  As part of my medical decision making, I reviewed the following data within the electronic MEDICAL RECORD NUMBER Nursing notes reviewed and incorporated, Labs reviewed , Old chart reviewed, Radiograph reviewed , Notes from prior ED visits and [place] Controlled Substance Database  ____________________________________________   FINAL CLINICAL IMPRESSION(S) / ED DIAGNOSES  Final diagnoses:  Chronic low back pain with sciatica, sciatica laterality unspecified, unspecified back pain laterality  Alcoholic intoxication without complication (HCC)      NEW MEDICATIONS STARTED DURING THIS VISIT:  Current Discharge Medication List       Note:  This document was prepared using Dragon voice recognition software and may include unintentional dictation errors.     Faythe Ghee, PA-C 11/10/20 1333    Willy Eddy, MD 11/10/20 [phone]

## 2020-11-10 NOTE — ED Triage Notes (Signed)
Pt comes via EMS with c/o lower back pain. Pt states this is a chronic issues. Pt has hx of scoliosis. Pt out of medication. Pt is intoxicated and has been drinking since yesterday.  EMS reports BP 168/103, 98% RA, CBG-241  Pt has hx of diabetes and HTN. Pt hasn't taken medication today.

## 2020-11-10 NOTE — ED Notes (Signed)
Pt calm , collective upon discharge. Ambulatory without assistance

## 2020-11-10 NOTE — ED Notes (Signed)
Per Lab re-top needs to be redrawn

## 2020-11-11 ENCOUNTER — Other Ambulatory Visit: Payer: Self-pay

## 2020-11-11 ENCOUNTER — Emergency Department
Admission: EM | Admit: 2020-11-11 | Discharge: 2020-11-11 | Disposition: A | Discharge status: Home / Self Care | Payer: Medicaid Other | Attending: Emergency Medicine | Admitting: Emergency Medicine

## 2020-11-11 DIAGNOSIS — Z5321 Procedure and treatment not carried out due to patient leaving prior to being seen by health care provider: Secondary | ICD-10-CM | POA: Diagnosis not present

## 2020-11-11 DIAGNOSIS — M549 Dorsalgia, unspecified: Secondary | ICD-10-CM | POA: Insufficient documentation

## 2020-11-11 LAB — CBC
HCT: 41.9 % (ref 39.0–52.0)
Hemoglobin: 14.2 g/dL (ref 13.0–17.0)
MCH: 30.9 pg (ref 26.0–34.0)
MCHC: 33.9 g/dL (ref 30.0–36.0)
MCV: 91.3 fL (ref 80.0–100.0)
Platelets: 173 10*3/uL (ref 150–400)
RBC: 4.59 MIL/uL (ref 4.22–5.81)
RDW: 15.6 % — ABNORMAL HIGH (ref 11.5–15.5)
WBC: 7.1 10*3/uL (ref 4.0–10.5)
nRBC: 0 % (ref 0.0–0.2)

## 2020-11-11 LAB — ETHANOL: Alcohol, Ethyl (B): 314 mg/dL (ref <10)

## 2020-11-11 NOTE — ED Notes (Signed)
Lab Called Critical ETOH 314

## 2020-11-11 NOTE — ED Notes (Signed)
Called pt several times no answer RN walked around waiting oom no asnwer, walked outside no answer

## 2020-11-11 NOTE — ED Triage Notes (Signed)
Patient report here for back pain and would like detox from alcohol.  Patient states last drink was earlier today.

## 2020-11-12 ENCOUNTER — Encounter: Payer: Self-pay | Admitting: Emergency Medicine

## 2020-11-12 ENCOUNTER — Emergency Department: Payer: No Typology Code available for payment source

## 2020-11-12 ENCOUNTER — Emergency Department
Admission: EM | Admit: 2020-11-12 | Discharge: 2020-11-12 | Disposition: A | Discharge status: Home / Self Care | Payer: No Typology Code available for payment source | Attending: Emergency Medicine | Admitting: Emergency Medicine

## 2020-11-12 ENCOUNTER — Emergency Department
Admission: EM | Admit: 2020-11-12 | Discharge: 2020-11-13 | Disposition: A | Discharge status: Home / Self Care | Payer: No Typology Code available for payment source | Source: Home / Self Care | Attending: Emergency Medicine | Admitting: Emergency Medicine

## 2020-11-12 DIAGNOSIS — Z79899 Other long term (current) drug therapy: Secondary | ICD-10-CM | POA: Insufficient documentation

## 2020-11-12 DIAGNOSIS — I10 Essential (primary) hypertension: Secondary | ICD-10-CM | POA: Diagnosis not present

## 2020-11-12 DIAGNOSIS — Z7984 Long term (current) use of oral hypoglycemic drugs: Secondary | ICD-10-CM | POA: Diagnosis not present

## 2020-11-12 DIAGNOSIS — F10120 Alcohol abuse with intoxication, uncomplicated: Secondary | ICD-10-CM | POA: Insufficient documentation

## 2020-11-12 DIAGNOSIS — E119 Type 2 diabetes mellitus without complications: Secondary | ICD-10-CM | POA: Diagnosis not present

## 2020-11-12 DIAGNOSIS — F10230 Alcohol dependence with withdrawal, uncomplicated: Secondary | ICD-10-CM | POA: Diagnosis not present

## 2020-11-12 DIAGNOSIS — F1010 Alcohol abuse, uncomplicated: Secondary | ICD-10-CM | POA: Diagnosis present

## 2020-11-12 DIAGNOSIS — M549 Dorsalgia, unspecified: Secondary | ICD-10-CM | POA: Insufficient documentation

## 2020-11-12 DIAGNOSIS — F10920 Alcohol use, unspecified with intoxication, uncomplicated: Secondary | ICD-10-CM

## 2020-11-12 DIAGNOSIS — F10930 Alcohol use, unspecified with withdrawal, uncomplicated: Secondary | ICD-10-CM

## 2020-11-12 DIAGNOSIS — G8929 Other chronic pain: Secondary | ICD-10-CM | POA: Diagnosis not present

## 2020-11-12 LAB — CBC
HCT: 42.4 % (ref 39.0–52.0)
Hemoglobin: 14.3 g/dL (ref 13.0–17.0)
MCH: 30.7 pg (ref 26.0–34.0)
MCHC: 33.7 g/dL (ref 30.0–36.0)
MCV: 91 fL (ref 80.0–100.0)
Platelets: 156 10*3/uL (ref 150–400)
RBC: 4.66 MIL/uL (ref 4.22–5.81)
RDW: 15.6 % — ABNORMAL HIGH (ref 11.5–15.5)
WBC: 5.4 10*3/uL (ref 4.0–10.5)
nRBC: 0 % (ref 0.0–0.2)

## 2020-11-12 LAB — COMPREHENSIVE METABOLIC PANEL
ALT: 32 U/L (ref 0–44)
ALT: 31 U/L (ref 0–44)
AST: 48 U/L — ABNORMAL HIGH (ref 15–41)
AST: 45 U/L — ABNORMAL HIGH (ref 15–41)
Albumin: 4.1 g/dL (ref 3.5–5.0)
Albumin: 4.1 g/dL (ref 3.5–5.0)
Alkaline Phosphatase: 63 U/L (ref 38–126)
Alkaline Phosphatase: 61 U/L (ref 38–126)
Anion gap: 15 (ref 5–15)
Anion gap: 14 (ref 5–15)
BUN: 9 mg/dL (ref 6–20)
BUN: 11 mg/dL (ref 6–20)
CO2: 24 mmol/L (ref 22–32)
CO2: 25 mmol/L (ref 22–32)
Calcium: 8.9 mg/dL (ref 8.9–10.3)
Calcium: 8.8 mg/dL — ABNORMAL LOW (ref 8.9–10.3)
Chloride: 109 mmol/L (ref 98–111)
Chloride: 105 mmol/L (ref 98–111)
Creatinine, Ser: 0.77 mg/dL (ref 0.61–1.24)
Creatinine, Ser: 0.81 mg/dL (ref 0.61–1.24)
GFR, Estimated: >60 mL/min (ref >60)
GFR, Estimated: >60 mL/min (ref >60)
Glucose, Bld: 97 mg/dL (ref 70–99)
Glucose, Bld: 143 mg/dL — ABNORMAL HIGH (ref 70–99)
Potassium: 3.8 mmol/L (ref 3.5–5.1)
Potassium: 3.6 mmol/L (ref 3.5–5.1)
Sodium: 148 mmol/L — ABNORMAL HIGH (ref 135–145)
Sodium: 144 mmol/L (ref 135–145)
Total Bilirubin: 0.7 mg/dL (ref 0.3–1.2)
Total Bilirubin: 1 mg/dL (ref 0.3–1.2)
Total Protein: 7.5 g/dL (ref 6.5–8.1)
Total Protein: 7.7 g/dL (ref 6.5–8.1)

## 2020-11-12 LAB — URINE DRUG SCREEN, QUALITATIVE (ARMC ONLY)
Amphetamines, Ur Screen: NOT DETECTED
Amphetamines, Ur Screen: NOT DETECTED
Barbiturates, Ur Screen: NOT DETECTED
Barbiturates, Ur Screen: NOT DETECTED
Benzodiazepine, Ur Scrn: POSITIVE — AB
Benzodiazepine, Ur Scrn: NOT DETECTED
Cannabinoid 50 Ng, Ur ~~LOC~~: NOT DETECTED
Cannabinoid 50 Ng, Ur ~~LOC~~: NOT DETECTED
Cocaine Metabolite,Ur ~~LOC~~: NOT DETECTED
Cocaine Metabolite,Ur ~~LOC~~: NOT DETECTED
MDMA (Ecstasy)Ur Screen: NOT DETECTED
MDMA (Ecstasy)Ur Screen: NOT DETECTED
Methadone Scn, Ur: NOT DETECTED
Methadone Scn, Ur: NOT DETECTED
Opiate, Ur Screen: NOT DETECTED
Opiate, Ur Screen: NOT DETECTED
Phencyclidine (PCP) Ur S: NOT DETECTED
Phencyclidine (PCP) Ur S: NOT DETECTED
Tricyclic, Ur Screen: NOT DETECTED
Tricyclic, Ur Screen: NOT DETECTED

## 2020-11-12 LAB — ETHANOL: Alcohol, Ethyl (B): 183 mg/dL — ABNORMAL HIGH (ref <10)

## 2020-11-12 MED ORDER — THIAMINE HCL 100 MG/ML IJ SOLN
Freq: Once | INTRAVENOUS | Status: AC
  Filled 2020-11-12: qty 1000

## 2020-11-12 MED ORDER — ONDANSETRON 4 MG PO TBDP
4.0000 mg | ORAL_TABLET | Freq: Once | ORAL | Status: AC
  Administered 2020-11-12: 4 mg via ORAL
  Filled 2020-11-12: qty 1

## 2020-11-12 MED ORDER — THIAMINE HCL 100 MG PO TABS: 100.0000 mg | ORAL_TABLET | Freq: Every day | ORAL | Status: DC

## 2020-11-12 MED ORDER — LORAZEPAM 2 MG/ML IJ SOLN: 0.0000 mg | Freq: Four times a day (QID) | INTRAMUSCULAR | Status: DC

## 2020-11-12 MED ORDER — LORAZEPAM 2 MG/ML IJ SOLN: 0.0000 mg | Freq: Two times a day (BID) | INTRAMUSCULAR | Status: DC

## 2020-11-12 MED ORDER — THIAMINE HCL 100 MG/ML IJ SOLN: 100.0000 mg | Freq: Every day | INTRAMUSCULAR | Status: DC

## 2020-11-12 MED ORDER — LORAZEPAM 2 MG PO TABS: 0.0000 mg | ORAL_TABLET | Freq: Two times a day (BID) | ORAL | Status: DC

## 2020-11-12 MED ORDER — LORAZEPAM 2 MG PO TABS
0.0000 mg | ORAL_TABLET | Freq: Four times a day (QID) | ORAL | Status: DC
  Administered 2020-11-12: 2 mg via ORAL
  Filled 2020-11-12: qty 1

## 2020-11-12 MED ORDER — THIAMINE HCL 100 MG PO TABS
100.0000 mg | ORAL_TABLET | Freq: Every day | ORAL | Status: DC
  Administered 2020-11-12: 100 mg via ORAL
  Filled 2020-11-12: qty 1

## 2020-11-12 MED ORDER — CHLORDIAZEPOXIDE HCL 25 MG PO CAPS: ORAL_CAPSULE | ORAL | 0 refills | Status: AC

## 2020-11-12 MED ORDER — CHLORDIAZEPOXIDE HCL 25 MG PO CAPS
50.0000 mg | ORAL_CAPSULE | Freq: Once | ORAL | Status: AC
  Administered 2020-11-12: 50 mg via ORAL
  Filled 2020-11-12: qty 2

## 2020-11-12 NOTE — ED Provider Notes (Signed)
[facility] Emergency Department Provider Note   ____________________________________________   Event Date/Time   First MD Initiated Contact with Patient 11/12/20 [phone]     (approximate)  I have reviewed the triage vital signs and the nursing notes.   HISTORY  Chief Complaint Alcohol Problem    HPI Gregory Duran is a 60 y.o. male with past medical history of hypertension, diabetes, and chronic back pain who presents to the ED for alcohol abuse.  Patient states that he is "the most hopeless alcoholic you will ever meet" and he would now like help with alcohol detox.  He states he will drink whatever he can get his hands on, has been consuming as much as 1/5 of liquor and 12 beers daily.  He states his last drink was sometime last night, but he is not sure of the exact time as he eventually passed out.  He reports feeling nauseous and is concerned that he is going into withdrawal.  He complains of some chronic back pain, but otherwise denies any abdominal pain, hematemesis, or changes in bowel movements.  He denies any suicidal or homicidal ideation.  He denies any substance abuse outside of alcohol.        Past Medical History:  Diagnosis Date  . Anxiety   . Chronic back pain   . Chronic shoulder pain   . Diabetes mellitus (HCC)   . Hepatitis C    Patient states that he does not have hep C.  Will bring test results.  . Hypertension   . Sciatica   . Scoliosis   . Sleep apnea   . Spinal stenosis     Patient Active Problem List   Diagnosis Date Noted  . Nephrolithiasis 08/31/2020  . Abdominal mass 07/18/2020  . History of hepatitis C 07/18/2020  . Ear fullness, bilateral 07/18/2020  . GERD (gastroesophageal reflux disease) 06/29/2019  . Gastroesophageal reflux disease with esophagitis   . Acute blood loss anemia   . Controlled type 2 diabetes mellitus with complication, without long-term current use of insulin (HCC)   . Severe dehydration  11/24/2015  . AKI (acute kidney injury) (HCC) 11/24/2015  . Melena 11/24/2015  . Hyponatremia 11/24/2015  . Leukocytosis 11/24/2015  . GI bleed 11/24/2015  . Hypertension 05/05/2012  . Diabetes mellitus (HCC) 05/05/2012  . Chronic low back pain 05/05/2012  . Obesity 05/05/2012    Past Surgical History:  Procedure Laterality Date  . COLONOSCOPY WITH PROPOFOL N/A 08/10/2020   Procedure: COLONOSCOPY WITH PROPOFOL;  Surgeon: Dolores Frame, MD;  Location: [facility];  Service: Gastroenterology;  Laterality: N/A;  11:15  . ESOPHAGOGASTRODUODENOSCOPY (EGD) WITH PROPOFOL N/A 11/25/2015   Procedure: ESOPHAGOGASTRODUODENOSCOPY (EGD) WITH PROPOFOL;  Surgeon: Malissa Hippo, MD;  Location: [facility];  Service: Endoscopy;  Laterality: N/A;  . KIDNEY STONE SURGERY      Prior to Admission medications   Medication Sig Start Date End Date Taking? Authorizing Provider  chlordiazePOXIDE (LIBRIUM) 25 MG capsule Take 1 capsule (25 mg total) by mouth 5 (five) times daily for 1 day, THEN 1 capsule (25 mg total) 4 (four) times daily for 1 day, THEN 1 capsule (25 mg total) 3 (three) times daily for 1 day, THEN 1 capsule (25 mg total) 2 (two) times daily for 1 day, THEN 1 capsule (25 mg total) daily for 1 day. 11/12/20 11/17/20 Yes Chesley Noon, MD  gabapentin (NEURONTIN) 300 MG capsule Take 1 capsule (300 mg total) by mouth 3 (three)  times daily for 5 days. 02/29/20 03/05/20  Willy Eddy, MD  metFORMIN (GLUCOPHAGE-XR) 500 MG 24 hr tablet Take 1,000 mg by mouth 2 (two) times daily.    [provider]  Oxycodone HCl 10 MG TABS Take 10 mg by mouth 4 (four) times daily as needed (pain).    [provider]  pantoprazole (PROTONIX) 40 MG tablet Take 1 tablet (40 mg total) by mouth daily before breakfast for 5 days. 02/29/20 03/05/20  Willy Eddy, MD  dicyclomine (BENTYL) 10 MG capsule Take 1 capsule (10 mg total) by mouth 2 (two) times daily before a meal. Patient not  taking: Reported on 10/12/2020 07/28/20 11/10/20  Dolores Frame, MD  glipiZIDE (GLUCOTROL) 10 MG tablet Take 2 tablets (20 mg total) by mouth daily for 5 days. Patient taking differently: Take 10 mg by mouth 2 (two) times daily. 02/29/20 11/10/20  Willy Eddy, MD  lisinopril-hydrochlorothiazide (ZESTORETIC) 20-25 MG tablet Take 1 tablet by mouth daily for 5 days. Patient taking differently: Take 1 tablet by mouth 2 (two) times daily. 02/29/20 11/10/20  Willy Eddy, MD    Allergies Fish allergy  Family History  Problem Relation Age of Onset  . Heart disease Mother   . Heart disease Father   . Healthy Sister   . Hypertension Brother   . Hypertension Brother   . Hypertension Brother   . Diabetes Brother     Social History Social History   Tobacco Use  . Smoking status: Never Smoker  . Smokeless tobacco: Never Used  Vaping Use  . Vaping Use: Never used  Substance Use Topics  . Alcohol use: Yes    Comment: 12 pack and 1/5th of liquor daily  . Drug use: No    Review of Systems  Constitutional: No fever/chills Eyes: No visual changes. ENT: No sore throat. Cardiovascular: Denies chest pain. Respiratory: Denies shortness of breath. Gastrointestinal: No abdominal pain.  Positive for nausea, no vomiting.  No diarrhea.  No constipation. Genitourinary: Negative for dysuria. Musculoskeletal: Negative for back pain. Skin: Negative for rash. Neurological: Negative for headaches, focal weakness or numbness.  ____________________________________________   PHYSICAL EXAM:  VITAL SIGNS: ED Triage Vitals [11/12/20 0829]  Enc Vitals Group     BP (!) 141/91     Pulse Rate (!) 108     Resp 20     Temp 98 F (36.7 C)     Temp Source Oral     SpO2 98 %     Weight      Height      Head Circumference      Peak Flow      Pain Score      Pain Loc      Pain Edu?      Excl. in GC?     Constitutional: Alert and oriented. Eyes: Conjunctivae are normal. Head:  Atraumatic. Nose: No congestion/rhinnorhea. Mouth/Throat: Mucous membranes are moist. Neck: Normal ROM Cardiovascular: Tachycardic, regular rhythm. Grossly normal heart sounds. Respiratory: Normal respiratory effort.  No retractions. Lungs CTAB. Gastrointestinal: Soft and nontender. No distention. Genitourinary: deferred Musculoskeletal: No lower extremity tenderness nor edema. Neurologic:  Normal speech and language. No gross focal neurologic deficits are appreciated. Skin:  Skin is warm, dry and intact. No rash noted. Psychiatric: Mood and affect are normal. Speech and behavior are normal.  ____________________________________________   LABS (all labs ordered are listed, but only abnormal results are displayed)  Labs Reviewed  COMPREHENSIVE METABOLIC PANEL - Abnormal; Notable for the following components:  Result Value   Glucose, Bld 143 (*)    Calcium 8.8 (*)    AST 45 (*)    All other components within normal limits  ETHANOL - Abnormal; Notable for the following components:   Alcohol, Ethyl (B) 183 (*)    All other components within normal limits  CBC - Abnormal; Notable for the following components:   RDW 15.6 (*)    All other components within normal limits  URINE DRUG SCREEN, QUALITATIVE ([facility] ONLY) - Abnormal; Notable for the following components:   Benzodiazepine, Ur Scrn POSITIVE (*)    All other components within normal limits  RESP PANEL BY RT-PCR (FLU A&B, COVID) ARPGX2    PROCEDURES  Procedure(s) performed (including Critical Care):  Procedures   ____________________________________________   INITIAL IMPRESSION / ASSESSMENT AND PLAN / ED COURSE       60 year old male with past medical history of hypertension, diabetes, and chronic back pain who presents to the ED requesting assistance with alcohol detox.  He does appear to be developing some mild withdrawal symptoms given tachycardia and mild tremulousness.  We will treat with Librium and start on  CIWA protocol.  Lab work is reassuring at this time and we will have patient assessed by TTS for potential detox placement.  There is no dictation for IVC or psychiatric consult at this time.  His back pain is chronic and unchanged today, no symptoms or exam findings concerning for cauda equina.  Unfortunately, TTS is not available today for any potential detox placement.  Patient's withdrawal symptoms are improved and he is agreeable to plan for discharge home with outpatient resources for detox.  He will be prescribed Librium taper to manage his withdrawal symptoms.  He was counseled to return to the ED for new worsening symptoms, patient agrees with plan.      ____________________________________________   FINAL CLINICAL IMPRESSION(S) / ED DIAGNOSES  Final diagnoses:  Alcohol abuse  Alcohol withdrawal syndrome without complication [facility])     ED Discharge Orders         Ordered    chlordiazePOXIDE (LIBRIUM) 25 MG capsule        11/12/20 1212           Note:  This document was prepared using Dragon voice recognition software and may include unintentional dictation errors.   Chesley Noon, MD 11/12/20 1214

## 2020-11-12 NOTE — BH Assessment (Signed)
Called patient's nurse to set up the TTS machine.

## 2020-11-12 NOTE — ED Notes (Signed)
Pt eloped prior to d/c paperwork being given. Unable to reach pt by phone after 3 attempts. [facility] TTS aware. [place] MD aware.

## 2020-11-12 NOTE — ED Triage Notes (Signed)
Pt in w/request for ETOH detox, last drink yesterday. No thoughts of harming self or others, just wants help giving up ETOH. Typically drinks 12pack and 1/5th of liquor daily.

## 2020-11-12 NOTE — ED Triage Notes (Signed)
Patient from a bar via [facility] with complaints of being "kicked out of bar for being too drunk", EMS reports transporting individual yesterday for same reason - no further report  Pt with snoring respirations withdraws from noxious stimuli but does answer questions, pt is urine soaked  After initial VS pt sats fell to 82%, 3 lpm [place] added to get pt to 97%  History of elopement after sobering last 2 days

## 2020-11-12 NOTE — ED Provider Notes (Incomplete)
[facility] Emergency Department Provider Note   ____________________________________________   Event Date/Time   First MD Initiated Contact with Patient 11/12/20 2323     (approximate)  I have reviewed the triage vital signs and the nursing notes.   HISTORY  Chief Complaint Alcohol Intoxication  Level V caveat: Limited by intoxication  HPI Gregory Duran is a 60 y.o. male brought to the ED via EMS from a local bar with a chief complaint of "kicked out of bar for being too drunk".  Patient has a history of alcohol abuse and was seen yesterday in the ED for same.  Rest of history is limited secondary to patient's extreme intoxication.  There is no report of trauma.     Past Medical History:  Diagnosis Date  . Anxiety   . Chronic back pain   . Chronic shoulder pain   . Diabetes mellitus (HCC)   . Hepatitis C    Patient states that he does not have hep C.  Will bring test results.  . Hypertension   . Sciatica   . Scoliosis   . Sleep apnea   . Spinal stenosis     Patient Active Problem List   Diagnosis Date Noted  . Nephrolithiasis 08/31/2020  . Abdominal mass 07/18/2020  . History of hepatitis C 07/18/2020  . Ear fullness, bilateral 07/18/2020  . GERD (gastroesophageal reflux disease) 06/29/2019  . Gastroesophageal reflux disease with esophagitis   . Acute blood loss anemia   . Controlled type 2 diabetes mellitus with complication, without long-term current use of insulin (HCC)   . Severe dehydration 11/24/2015  . AKI (acute kidney injury) (HCC) 11/24/2015  . Melena 11/24/2015  . Hyponatremia 11/24/2015  . Leukocytosis 11/24/2015  . GI bleed 11/24/2015  . Hypertension 05/05/2012  . Diabetes mellitus (HCC) 05/05/2012  . Chronic low back pain 05/05/2012  . Obesity 05/05/2012    Past Surgical History:  Procedure Laterality Date  . COLONOSCOPY WITH PROPOFOL N/A 08/10/2020   Procedure: COLONOSCOPY WITH PROPOFOL;  Surgeon: Dolores Frame, MD;  [facility];  Service: Gastroenterology;  Laterality: N/A;  11:15  . ESOPHAGOGASTRODUODENOSCOPY (EGD) WITH PROPOFOL N/A 11/25/2015   Procedure: ESOPHAGOGASTRODUODENOSCOPY (EGD) WITH PROPOFOL;  Surgeon: Malissa Hippo, MD;  Location: [facility];  Service: Endoscopy;  Laterality: N/A;  . KIDNEY STONE SURGERY      Prior to Admission medications   Medication Sig Start Date End Date Taking? Authorizing Provider  chlordiazePOXIDE (LIBRIUM) 25 MG capsule Take 1 capsule (25 mg total) by mouth 5 (five) times daily for 1 day, THEN 1 capsule (25 mg total) 4 (four) times daily for 1 day, THEN 1 capsule (25 mg total) 3 (three) times daily for 1 day, THEN 1 capsule (25 mg total) 2 (two) times daily for 1 day, THEN 1 capsule (25 mg total) daily for 1 day. 11/12/20 11/17/20  Chesley Noon, MD  gabapentin (NEURONTIN) 300 MG capsule Take 1 capsule (300 mg total) by mouth 3 (three) times daily for 5 days. 02/29/20 03/05/20  Willy Eddy, MD  metFORMIN (GLUCOPHAGE-XR) 500 MG 24 hr tablet Take 1,000 mg by mouth 2 (two) times daily.    [provider]  Oxycodone HCl 10 MG TABS Take 10 mg by mouth 4 (four) times daily as needed (pain).    [provider]  pantoprazole (PROTONIX) 40 MG tablet Take 1 tablet (40 mg total) by mouth daily before breakfast for 5 days. 02/29/20 03/05/20  Willy Eddy,  MD  dicyclomine (BENTYL) 10 MG capsule Take 1 capsule (10 mg total) by mouth 2 (two) times daily before a meal. Patient not taking: Reported on 10/12/2020 07/28/20 11/10/20  Dolores Frame, MD  glipiZIDE (GLUCOTROL) 10 MG tablet Take 2 tablets (20 mg total) by mouth daily for 5 days. Patient taking differently: Take 10 mg by mouth 2 (two) times daily. 02/29/20 11/10/20  Willy Eddy, MD  lisinopril-hydrochlorothiazide (ZESTORETIC) 20-25 MG tablet Take 1 tablet by mouth daily for 5 days. Patient taking differently: Take 1 tablet by mouth 2 (two) times daily.  02/29/20 11/10/20  Willy Eddy, MD    Allergies Fish allergy  Family History  Problem Relation Age of Onset  . Heart disease Mother   . Heart disease Father   . Healthy Sister   . Hypertension Brother   . Hypertension Brother   . Hypertension Brother   . Diabetes Brother     Social History Social History   Tobacco Use  . Smoking status: Never Smoker  . Smokeless tobacco: Never Used  Vaping Use  . Vaping Use: Never used  Substance Use Topics  . Alcohol use: Yes    Comment: 12 pack and 1/5th of liquor daily  . Drug use: No    Review of Systems  Constitutional: Positive for intoxication.  No fever/chills Eyes: No visual changes. ENT: No sore throat. Cardiovascular: Denies chest pain. Respiratory: Denies shortness of breath. Gastrointestinal: No abdominal pain.  No nausea, no vomiting.  No diarrhea.  No constipation. Genitourinary: Negative for dysuria. Musculoskeletal: Negative for back pain. Skin: Negative for rash. Neurological: Negative for headaches, focal weakness or numbness.   ____________________________________________   PHYSICAL EXAM:  VITAL SIGNS: ED Triage Vitals  Enc Vitals Group     BP 11/12/20 2050 (!) 141/83     Pulse Rate 11/12/20 2050 98     Resp 11/12/20 2050 20     Temp 11/12/20 2050 98.6 F (37 C)     Temp Source 11/12/20 2050 Oral     SpO2 11/12/20 2050 97 %     Weight 11/12/20 2103 260 lb (117.9 kg)     Height 11/12/20 2103 5\' 11"  (1.803 m)     Head Circumference --      Peak Flow --      Pain Score 11/12/20 2103 Asleep     Pain Loc --      Pain Edu? --      Excl. in GC? --     Constitutional: Sleeping.  Intoxicated appearing and in no acute distress. Eyes: Conjunctivae are normal. PERRL. EOMI. Head: Atraumatic. Nose: Atraumatic. Mouth/Throat: Mucous membranes are moist.  No dental occlusion. Neck: No stridor.  No cervical spine tenderness to palpation. Cardiovascular: Normal rate, regular rhythm. Grossly normal heart  sounds.  Good peripheral circulation. Respiratory: Normal respiratory effort.  No retractions. Lungs CTAB. Gastrointestinal: Soft and nontender to light and deep palpation. No distention. No abdominal bruits. No CVA tenderness. Musculoskeletal: No lower extremity tenderness nor edema.  No joint effusions. Neurologic:  Intoxicated. No gross focal neurologic deficits are appreciated. MAEx4 to painful stimuli. Skin:  Skin is warm, dry and intact. No rash noted. Psychiatric: Unable to assess. ____________________________________________   LABS (all labs ordered are listed, but only abnormal results are displayed)  Labs Reviewed  URINE DRUG SCREEN, QUALITATIVE ([facility] ONLY)  ETHANOL  COMPREHENSIVE METABOLIC PANEL  CBC   ____________________________________________  EKG  None ____________________________________________  RADIOLOGY I, Darrion Wyszynski J, personally viewed and evaluated these images (plain  radiographs) as part of my medical decision making, as well as reviewing the written report by the radiologist.  ED MD interpretation:  ***  Official radiology report(s): No results found.  ____________________________________________   PROCEDURES  Procedure(s) performed (including Critical Care):  .1-3 Lead EKG Interpretation Performed by: Irean Hong, MD Authorized by: Irean Hong, MD     Interpretation: normal     ECG rate:  98   ECG rate assessment: normal     Rhythm: sinus rhythm     Ectopy: none     Conduction: normal   Comments:     Patient placed on cardiac monitor to evaluate for arrhythmias     ____________________________________________   INITIAL IMPRESSION / ASSESSMENT AND PLAN / ED COURSE  As part of my medical decision making, I reviewed the following data within the electronic MEDICAL RECORD NUMBER Nursing notes reviewed and incorporated, Labs reviewed, Old chart reviewed, Radiograph reviewed and Notes from prior ED visits     60 year old male with  alcohol abuse who presents with intoxication. Differential diagnosis includes, but is not limited to, alcohol, illicit or prescription medications, or other toxic ingestion; intracranial pathology such as stroke or intracerebral hemorrhage; fever or infectious causes including sepsis; hypoxemia and/or hypercarbia; uremia; trauma; endocrine related disorders such as diabetes, hypoglycemia, and thyroid-related diseases; hypertensive encephalopathy; etc.  Will obtain basic lab work, x-ray; initiate IV fluid resuscitation.  Will monitor in the ED until sobriety.      ____________________________________________   FINAL CLINICAL IMPRESSION(S) / ED DIAGNOSES  Final diagnoses:  Alcoholic intoxication without complication (HCC)  Alcohol abuse     ED Discharge Orders    None      *Please note:  Gregory Duran was evaluated in Emergency Department on 11/12/2020 for the symptoms described in the history of present illness. He was evaluated in the context of the global COVID-19 pandemic, which necessitated consideration that the patient might be at risk for infection with the SARS-CoV-2 virus that causes COVID-19. Institutional protocols and algorithms that pertain to the evaluation of patients at risk for COVID-19 are in a state of rapid change based on information released by regulatory bodies including the CDC and federal and state organizations. These policies and algorithms were followed during the patient's care in the ED.  Some ED evaluations and interventions may be delayed as a result of limited staffing during and the pandemic.*   Note:  This document was prepared using Dragon voice recognition software and may include unintentional dictation errors.

## 2020-11-12 NOTE — ED Provider Notes (Signed)
[facility] Emergency Department Provider Note   ____________________________________________   Event Date/Time   First MD Initiated Contact with Patient 11/12/20 2323     (approximate)  I have reviewed the triage vital signs and the nursing notes.   HISTORY  Chief Complaint Alcohol Intoxication  Level V caveat: Limited by intoxication  HPI Gregory Duran is a 60 y.o. male brought to the ED via EMS from a local bar with a chief complaint of "kicked out of bar for being too drunk".  Patient has a history of alcohol abuse and was seen yesterday in the ED for same.  Rest of history is limited secondary to patient's extreme intoxication.  There is no report of trauma.     Past Medical History:  Diagnosis Date  . Anxiety   . Chronic back pain   . Chronic shoulder pain   . Diabetes mellitus (HCC)   . Hepatitis C    Patient states that he does not have hep C.  Will bring test results.  . Hypertension   . Sciatica   . Scoliosis   . Sleep apnea   . Spinal stenosis     Patient Active Problem List   Diagnosis Date Noted  . Nephrolithiasis 08/31/2020  . Abdominal mass 07/18/2020  . History of hepatitis C 07/18/2020  . Ear fullness, bilateral 07/18/2020  . GERD (gastroesophageal reflux disease) 06/29/2019  . Gastroesophageal reflux disease with esophagitis   . Acute blood loss anemia   . Controlled type 2 diabetes mellitus with complication, without long-term current use of insulin (HCC)   . Severe dehydration 11/24/2015  . AKI (acute kidney injury) (HCC) 11/24/2015  . Melena 11/24/2015  . Hyponatremia 11/24/2015  . Leukocytosis 11/24/2015  . GI bleed 11/24/2015  . Hypertension 05/05/2012  . Diabetes mellitus (HCC) 05/05/2012  . Chronic low back pain 05/05/2012  . Obesity 05/05/2012    Past Surgical History:  Procedure Laterality Date  . COLONOSCOPY WITH PROPOFOL N/A 08/10/2020   Procedure: COLONOSCOPY WITH PROPOFOL;  Surgeon: Dolores Frame, MD;  Location: [facility];  Service: Gastroenterology;  Laterality: N/A;  11:15  . ESOPHAGOGASTRODUODENOSCOPY (EGD) WITH PROPOFOL N/A 11/25/2015   Procedure: ESOPHAGOGASTRODUODENOSCOPY (EGD) WITH PROPOFOL;  Surgeon: Malissa Hippo, MD;  Location: [facility];  Service: Endoscopy;  Laterality: N/A;  . KIDNEY STONE SURGERY      Prior to Admission medications   Medication Sig Start Date End Date Taking? Authorizing Provider  chlordiazePOXIDE (LIBRIUM) 25 MG capsule Take 1 capsule (25 mg total) by mouth 5 (five) times daily for 1 day, THEN 1 capsule (25 mg total) 4 (four) times daily for 1 day, THEN 1 capsule (25 mg total) 3 (three) times daily for 1 day, THEN 1 capsule (25 mg total) 2 (two) times daily for 1 day, THEN 1 capsule (25 mg total) daily for 1 day. 11/12/20 11/17/20  Chesley Noon, MD  gabapentin (NEURONTIN) 300 MG capsule Take 1 capsule (300 mg total) by mouth 3 (three) times daily for 5 days. 02/29/20 03/05/20  Willy Eddy, MD  metFORMIN (GLUCOPHAGE-XR) 500 MG 24 hr tablet Take 1,000 mg by mouth 2 (two) times daily.    [provider]  Oxycodone HCl 10 MG TABS Take 10 mg by mouth 4 (four) times daily as needed (pain).    [provider]  pantoprazole (PROTONIX) 40 MG tablet Take 1 tablet (40 mg total) by mouth daily before breakfast for 5 days. 02/29/20 03/05/20  Willy Eddy,  MD  dicyclomine (BENTYL) 10 MG capsule Take 1 capsule (10 mg total) by mouth 2 (two) times daily before a meal. Patient not taking: Reported on 10/12/2020 07/28/20 11/10/20  Dolores Frameastaneda Mayorga, Daniel, MD  glipiZIDE (GLUCOTROL) 10 MG tablet Take 2 tablets (20 mg total) by mouth daily for 5 days. Patient taking differently: Take 10 mg by mouth 2 (two) times daily. 02/29/20 11/10/20  Willy Eddyobinson, Patrick, MD  lisinopril-hydrochlorothiazide (ZESTORETIC) 20-25 MG tablet Take 1 tablet by mouth daily for 5 days. Patient taking differently: Take 1 tablet by mouth 2 (two) times daily.  02/29/20 11/10/20  Willy Eddyobinson, Patrick, MD    Allergies Fish allergy  Family History  Problem Relation Age of Onset  . Heart disease Mother   . Heart disease Father   . Healthy Sister   . Hypertension Brother   . Hypertension Brother   . Hypertension Brother   . Diabetes Brother     Social History Social History   Tobacco Use  . Smoking status: Never Smoker  . Smokeless tobacco: Never Used  Vaping Use  . Vaping Use: Never used  Substance Use Topics  . Alcohol use: Yes    Comment: 12 pack and 1/5th of liquor daily  . Drug use: No    Review of Systems  Constitutional: Positive for intoxication.  No fever/chills Eyes: No visual changes. ENT: No sore throat. Cardiovascular: Denies chest pain. Respiratory: Denies shortness of breath. Gastrointestinal: No abdominal pain.  No nausea, no vomiting.  No diarrhea.  No constipation. Genitourinary: Negative for dysuria. Musculoskeletal: Negative for back pain. Skin: Negative for rash. Neurological: Negative for headaches, focal weakness or numbness.   ____________________________________________   PHYSICAL EXAM:  VITAL SIGNS: ED Triage Vitals  Enc Vitals Group     BP 11/12/20 2050 (!) 141/83     Pulse Rate 11/12/20 2050 98     Resp 11/12/20 2050 20     Temp 11/12/20 2050 98.6 F (37 C)     Temp Source 11/12/20 2050 Oral     SpO2 11/12/20 2050 97 %     Weight 11/12/20 2103 260 lb (117.9 kg)     Height 11/12/20 2103 5\' 11"  (1.803 m)     Head Circumference --      Peak Flow --      Pain Score 11/12/20 2103 Asleep     Pain Loc --      Pain Edu? --      Excl. in GC? --     Constitutional: Sleeping.  Intoxicated appearing and in no acute distress. Eyes: Conjunctivae are normal. PERRL. EOMI. Head: Atraumatic. Nose: Atraumatic. Mouth/Throat: Mucous membranes are moist.  No dental occlusion. Neck: No stridor.  No cervical spine tenderness to palpation. Cardiovascular: Normal rate, regular rhythm. Grossly normal heart  sounds.  Good peripheral circulation. Respiratory: Normal respiratory effort.  No retractions. Lungs CTAB. Gastrointestinal: Soft and nontender to light and deep palpation. No distention. No abdominal bruits. No CVA tenderness. Musculoskeletal: No lower extremity tenderness nor edema.  No joint effusions. Neurologic:  Intoxicated. No gross focal neurologic deficits are appreciated. MAEx4 to painful stimuli. Skin:  Skin is warm, dry and intact. No rash noted. Psychiatric: Unable to assess. ____________________________________________   LABS (all labs ordered are listed, but only abnormal results are displayed)  Labs Reviewed  ETHANOL - Abnormal; Notable for the following components:      Result Value   Alcohol, Ethyl (B) 298 (*)    All other components within normal limits  COMPREHENSIVE METABOLIC  PANEL - Abnormal; Notable for the following components:   Potassium 3.1 (*)    Glucose, Bld 105 (*)    Calcium 8.5 (*)    AST 47 (*)    All other components within normal limits  CBC - Abnormal; Notable for the following components:   HCT 38.4 (*)    Platelets 128 (*)    All other components within normal limits  URINE DRUG SCREEN, QUALITATIVE ([facility] ONLY)   ____________________________________________  EKG  None ____________________________________________  RADIOLOGY I, Arelene Moroni J, personally viewed and evaluated these images (plain radiographs) as part of my medical decision making, as well as reviewing the written report by the radiologist.  ED MD interpretation:  No acute cardiopulmonary process  Official radiology report(s): DG Chest Port 1 View  Result Date: 11/13/2020 CLINICAL DATA:  ETOH. Patient reports he was kicked out of a bar for being too drunk. EMS reports transport yesterday for same. EXAM: PORTABLE CHEST 1 VIEW COMPARISON:  Radiograph 11/17/2018 FINDINGS: Lung volumes are low.The cardiomediastinal contours are normal for degree of inspiration. Pulmonary  vasculature is normal. No consolidation, pleural effusion, or pneumothorax. No acute osseous abnormalities are seen. IMPRESSION: Low lung volumes without acute abnormality. Electronically Signed   By: Narda Rutherford M.D.   On: 11/13/2020 00:06    ____________________________________________   PROCEDURES  Procedure(s) performed (including Critical Care):  .1-3 Lead EKG Interpretation Performed by: Irean Hong, MD Authorized by: Irean Hong, MD     Interpretation: normal     ECG rate:  98   ECG rate assessment: normal     Rhythm: sinus rhythm     Ectopy: none     Conduction: normal   Comments:     Patient placed on cardiac monitor to evaluate for arrhythmias     ____________________________________________   INITIAL IMPRESSION / ASSESSMENT AND PLAN / ED COURSE  As part of my medical decision making, I reviewed the following data within the electronic MEDICAL RECORD NUMBER Nursing notes reviewed and incorporated, Labs reviewed, Old chart reviewed, Radiograph reviewed and Notes from prior ED visits     60 year old male with alcohol abuse who presents with intoxication. Differential diagnosis includes, but is not limited to, alcohol, illicit or prescription medications, or other toxic ingestion; intracranial pathology such as stroke or intracerebral hemorrhage; fever or infectious causes including sepsis; hypoxemia and/or hypercarbia; uremia; trauma; endocrine related disorders such as diabetes, hypoglycemia, and thyroid-related diseases; hypertensive encephalopathy; etc.  Will obtain basic lab work, x-ray; initiate IV fluid resuscitation.  Will monitor in the ED until sobriety.  Clinical Course as of 11/13/20 8413  Wynelle Link Nov 13, 2020  2440 Patient resting in no acute distress.  We will continue to monitor in the emergency department. [JS]  [id number] Patient awake, asking for meal tray. [JS]  0605 Patient ate a meal tray and fell back asleep.  He is now again awake and ready for  discharge home.  Strict return precautions given.  Patient verbalizes understanding agrees with plan of care. [JS]    Clinical Course User Index [JS] Irean Hong, MD     ____________________________________________   FINAL CLINICAL IMPRESSION(S) / ED DIAGNOSES  Final diagnoses:  Alcoholic intoxication without complication (HCC)  Alcohol abuse     ED Discharge Orders    None      *Please note:  Gregory Duran was evaluated in Emergency Department on 11/13/2020 for the symptoms described in the history of present illness. He was evaluated in the context of  the global COVID-19 pandemic, which necessitated consideration that the patient might be at risk for infection with the SARS-CoV-2 virus that causes COVID-19. Institutional protocols and algorithms that pertain to the evaluation of patients at risk for COVID-19 are in a state of rapid change based on information released by regulatory bodies including the CDC and federal and state organizations. These policies and algorithms were followed during the patient's care in the ED.  Some ED evaluations and interventions may be delayed as a result of limited staffing during and the pandemic.*   Note:  This document was prepared using Dragon voice recognition software and may include unintentional dictation errors.   Irean Hong, MD 11/13/20 [id number]

## 2020-11-13 ENCOUNTER — Emergency Department
Admission: EM | Admit: 2020-11-13 | Discharge: 2020-11-14 | Disposition: A | Discharge status: Home / Self Care | Payer: No Typology Code available for payment source | Attending: Emergency Medicine | Admitting: Emergency Medicine

## 2020-11-13 ENCOUNTER — Other Ambulatory Visit: Payer: Self-pay

## 2020-11-13 DIAGNOSIS — Z7984 Long term (current) use of oral hypoglycemic drugs: Secondary | ICD-10-CM | POA: Insufficient documentation

## 2020-11-13 DIAGNOSIS — Z79899 Other long term (current) drug therapy: Secondary | ICD-10-CM | POA: Diagnosis not present

## 2020-11-13 DIAGNOSIS — E119 Type 2 diabetes mellitus without complications: Secondary | ICD-10-CM | POA: Insufficient documentation

## 2020-11-13 DIAGNOSIS — F10920 Alcohol use, unspecified with intoxication, uncomplicated: Secondary | ICD-10-CM

## 2020-11-13 DIAGNOSIS — F10129 Alcohol abuse with intoxication, unspecified: Secondary | ICD-10-CM | POA: Diagnosis not present

## 2020-11-13 DIAGNOSIS — Y908 Blood alcohol level of 240 mg/100 ml or more: Secondary | ICD-10-CM | POA: Insufficient documentation

## 2020-11-13 DIAGNOSIS — I10 Essential (primary) hypertension: Secondary | ICD-10-CM | POA: Insufficient documentation

## 2020-11-13 LAB — CBC WITH DIFFERENTIAL/PLATELET
Abs Immature Granulocytes: 0.02 10*3/uL (ref 0.00–0.07)
Basophils Absolute: 0 10*3/uL (ref 0.0–0.1)
Basophils Relative: 0 %
Eosinophils Absolute: 0.1 10*3/uL (ref 0.0–0.5)
Eosinophils Relative: 2 %
HCT: 43.3 % (ref 39.0–52.0)
Hemoglobin: 14.3 g/dL (ref 13.0–17.0)
Immature Granulocytes: 0 %
Lymphocytes Relative: 37 %
Lymphs Abs: 1.9 10*3/uL (ref 0.7–4.0)
MCH: 30.3 pg (ref 26.0–34.0)
MCHC: 33 g/dL (ref 30.0–36.0)
MCV: 91.7 fL (ref 80.0–100.0)
Monocytes Absolute: 0.5 10*3/uL (ref 0.1–1.0)
Monocytes Relative: 10 %
Neutro Abs: 2.7 10*3/uL (ref 1.7–7.7)
Neutrophils Relative %: 51 %
Platelets: 124 10*3/uL — ABNORMAL LOW (ref 150–400)
RBC: 4.72 MIL/uL (ref 4.22–5.81)
RDW: 14.9 % (ref 11.5–15.5)
WBC: 5.2 10*3/uL (ref 4.0–10.5)
nRBC: 0 % (ref 0.0–0.2)

## 2020-11-13 LAB — COMPREHENSIVE METABOLIC PANEL
ALT: 32 U/L (ref 0–44)
ALT: 44 U/L (ref 0–44)
AST: 47 U/L — ABNORMAL HIGH (ref 15–41)
AST: 70 U/L — ABNORMAL HIGH (ref 15–41)
Albumin: 3.6 g/dL (ref 3.5–5.0)
Albumin: 4 g/dL (ref 3.5–5.0)
Alkaline Phosphatase: 58 U/L (ref 38–126)
Alkaline Phosphatase: 64 U/L (ref 38–126)
Anion gap: 11 (ref 5–15)
Anion gap: 13 (ref 5–15)
BUN: 6 mg/dL (ref 6–20)
BUN: 5 mg/dL — ABNORMAL LOW (ref 6–20)
CO2: 27 mmol/L (ref 22–32)
CO2: 22 mmol/L (ref 22–32)
Calcium: 8.5 mg/dL — ABNORMAL LOW (ref 8.9–10.3)
Calcium: 8.6 mg/dL — ABNORMAL LOW (ref 8.9–10.3)
Chloride: 107 mmol/L (ref 98–111)
Chloride: 104 mmol/L (ref 98–111)
Creatinine, Ser: 0.74 mg/dL (ref 0.61–1.24)
Creatinine, Ser: 0.52 mg/dL — ABNORMAL LOW (ref 0.61–1.24)
GFR, Estimated: >60 mL/min (ref >60)
GFR, Estimated: >60 mL/min (ref >60)
Glucose, Bld: 105 mg/dL — ABNORMAL HIGH (ref 70–99)
Glucose, Bld: 148 mg/dL — ABNORMAL HIGH (ref 70–99)
Potassium: 3.1 mmol/L — ABNORMAL LOW (ref 3.5–5.1)
Potassium: 3.2 mmol/L — ABNORMAL LOW (ref 3.5–5.1)
Sodium: 145 mmol/L (ref 135–145)
Sodium: 139 mmol/L (ref 135–145)
Total Bilirubin: 0.9 mg/dL (ref 0.3–1.2)
Total Bilirubin: 1 mg/dL (ref 0.3–1.2)
Total Protein: 6.7 g/dL (ref 6.5–8.1)
Total Protein: 7.1 g/dL (ref 6.5–8.1)

## 2020-11-13 LAB — URINE DRUG SCREEN, QUALITATIVE (ARMC ONLY)
Amphetamines, Ur Screen: NOT DETECTED
Barbiturates, Ur Screen: NOT DETECTED
Benzodiazepine, Ur Scrn: NOT DETECTED
Cannabinoid 50 Ng, Ur ~~LOC~~: NOT DETECTED
Cocaine Metabolite,Ur ~~LOC~~: NOT DETECTED
MDMA (Ecstasy)Ur Screen: NOT DETECTED
Methadone Scn, Ur: NOT DETECTED
Opiate, Ur Screen: NOT DETECTED
Phencyclidine (PCP) Ur S: NOT DETECTED
Tricyclic, Ur Screen: NOT DETECTED

## 2020-11-13 LAB — CBC
HCT: 38.4 % — ABNORMAL LOW (ref 39.0–52.0)
Hemoglobin: 13.1 g/dL (ref 13.0–17.0)
MCH: 31 pg (ref 26.0–34.0)
MCHC: 34.1 g/dL (ref 30.0–36.0)
MCV: 91 fL (ref 80.0–100.0)
Platelets: 128 10*3/uL — ABNORMAL LOW (ref 150–400)
RBC: 4.22 MIL/uL (ref 4.22–5.81)
RDW: 15.4 % (ref 11.5–15.5)
WBC: 5.1 10*3/uL (ref 4.0–10.5)
nRBC: 0 % (ref 0.0–0.2)

## 2020-11-13 LAB — ETHANOL
Alcohol, Ethyl (B): 298 mg/dL — ABNORMAL HIGH
Alcohol, Ethyl (B): 370 mg/dL (ref <10)

## 2020-11-13 MED ORDER — LACTATED RINGERS IV BOLUS: 1000.0000 mL | Freq: Once | INTRAVENOUS | Status: DC

## 2020-11-13 MED ORDER — POTASSIUM CHLORIDE CRYS ER 20 MEQ PO TBCR
40.0000 meq | EXTENDED_RELEASE_TABLET | Freq: Once | ORAL | Status: AC
  Administered 2020-11-13: 40 meq via ORAL
  Filled 2020-11-13: qty 2

## 2020-11-13 MED ORDER — SODIUM CHLORIDE 0.9 % IV BOLUS
1000.0000 mL | Freq: Once | INTRAVENOUS | Status: AC
  Administered 2020-11-13: 1000 mL via INTRAVENOUS

## 2020-11-13 NOTE — ED Notes (Signed)
Warmed fluids started, placed on bear hugger   Pt talking, soft, slow, somewhat repetitive. Pt answers questions appropriately. Pt denies falls. Reports chronic pain from scoliosis

## 2020-11-13 NOTE — ED Triage Notes (Signed)
Pt arrives via [facility] with complaint of cold exposure and ETOH intox. Pt d/c'd yesterday. Per EMS wants help for ETOH detox.  Pt found outside bar, wet, smells of urine. Pt responsive on arrival   temp 95 oral per EMS  316 cbg 144/86 bp

## 2020-11-13 NOTE — ED Notes (Signed)
Pt sleeping deeply. Snoring. Unable to obtain oral temp at this time, axillary used instead. Pt still on bear hugger, has finished 1L warmed NS bolus

## 2020-11-13 NOTE — Discharge Instructions (Addendum)
Drink alcohol only in moderation.  Return to the ER for worsening symptoms, persistent vomiting, difficulty breathing or other concerns. 

## 2020-11-13 NOTE — ED Notes (Signed)
Pt states he had covid about 2 weeks ago

## 2020-11-13 NOTE — ED Provider Notes (Signed)
[facility] Emergency Department Provider Note   ____________________________________________   Event Date/Time   First MD Initiated Contact with Patient 11/13/20 1952     (approximate)  I have reviewed the triage vital signs and the nursing notes.   HISTORY  Chief Complaint Cold Exposure and Alcohol Intoxication    HPI Gregory Duran is a 60 y.o. male with past medical history of hypertension, diabetes, chronic back pain, and alcohol abuse who presents to the ED for alcohol intoxication.  History is limited due to patient's extreme alcohol intoxication.  Per EMS, patient was found outside in a ditch with cold and wet clothes.  Patient admits to alcohol consumption but denies any drug use.  He denies any medical complaints at this time other than feeling cold.  He was found to have a temp of 95 orally by EMS.  He denies any thoughts of harming himself or others.        Past Medical History:  Diagnosis Date  . Anxiety   . Chronic back pain   . Chronic shoulder pain   . Diabetes mellitus (HCC)   . Hepatitis C    Patient states that he does not have hep C.  Will bring test results.  . Hypertension   . Sciatica   . Scoliosis   . Sleep apnea   . Spinal stenosis     Patient Active Problem List   Diagnosis Date Noted  . Nephrolithiasis 08/31/2020  . Abdominal mass 07/18/2020  . History of hepatitis C 07/18/2020  . Ear fullness, bilateral 07/18/2020  . GERD (gastroesophageal reflux disease) 06/29/2019  . Gastroesophageal reflux disease with esophagitis   . Acute blood loss anemia   . Controlled type 2 diabetes mellitus with complication, without long-term current use of insulin (HCC)   . Severe dehydration 11/24/2015  . AKI (acute kidney injury) (HCC) 11/24/2015  . Melena 11/24/2015  . Hyponatremia 11/24/2015  . Leukocytosis 11/24/2015  . GI bleed 11/24/2015  . Hypertension 05/05/2012  . Diabetes mellitus (HCC) 05/05/2012  . Chronic low  back pain 05/05/2012  . Obesity 05/05/2012    Past Surgical History:  Procedure Laterality Date  . COLONOSCOPY WITH PROPOFOL N/A 08/10/2020   Procedure: COLONOSCOPY WITH PROPOFOL;  Surgeon: Dolores Frame, MD;  Location: [facility];  Service: Gastroenterology;  Laterality: N/A;  11:15  . ESOPHAGOGASTRODUODENOSCOPY (EGD) WITH PROPOFOL N/A 11/25/2015   Procedure: ESOPHAGOGASTRODUODENOSCOPY (EGD) WITH PROPOFOL;  Surgeon: Malissa Hippo, MD;  Location: [facility];  Service: Endoscopy;  Laterality: N/A;  . KIDNEY STONE SURGERY      Prior to Admission medications   Medication Sig Start Date End Date Taking? Authorizing Provider  chlordiazePOXIDE (LIBRIUM) 25 MG capsule Take 1 capsule (25 mg total) by mouth 5 (five) times daily for 1 day, THEN 1 capsule (25 mg total) 4 (four) times daily for 1 day, THEN 1 capsule (25 mg total) 3 (three) times daily for 1 day, THEN 1 capsule (25 mg total) 2 (two) times daily for 1 day, THEN 1 capsule (25 mg total) daily for 1 day. 11/12/20 11/17/20  Chesley Noon, MD  gabapentin (NEURONTIN) 300 MG capsule Take 1 capsule (300 mg total) by mouth 3 (three) times daily for 5 days. 02/29/20 03/05/20  Willy Eddy, MD  metFORMIN (GLUCOPHAGE-XR) 500 MG 24 hr tablet Take 1,000 mg by mouth 2 (two) times daily.    [provider]  Oxycodone HCl 10 MG TABS Take 10 mg by mouth 4 (four)  times daily as needed (pain).    [provider]  pantoprazole (PROTONIX) 40 MG tablet Take 1 tablet (40 mg total) by mouth daily before breakfast for 5 days. 02/29/20 03/05/20  Willy Eddy, MD  dicyclomine (BENTYL) 10 MG capsule Take 1 capsule (10 mg total) by mouth 2 (two) times daily before a meal. Patient not taking: Reported on 10/12/2020 07/28/20 11/10/20  Dolores Frame, MD  glipiZIDE (GLUCOTROL) 10 MG tablet Take 2 tablets (20 mg total) by mouth daily for 5 days. Patient taking differently: Take 10 mg by mouth 2 (two) times daily. 02/29/20  11/10/20  Willy Eddy, MD  lisinopril-hydrochlorothiazide (ZESTORETIC) 20-25 MG tablet Take 1 tablet by mouth daily for 5 days. Patient taking differently: Take 1 tablet by mouth 2 (two) times daily. 02/29/20 11/10/20  Willy Eddy, MD    Allergies Fish allergy  Family History  Problem Relation Age of Onset  . Heart disease Mother   . Heart disease Father   . Healthy Sister   . Hypertension Brother   . Hypertension Brother   . Hypertension Brother   . Diabetes Brother     Social History Social History   Tobacco Use  . Smoking status: Never Smoker  . Smokeless tobacco: Never Used  Vaping Use  . Vaping Use: Never used  Substance Use Topics  . Alcohol use: Yes    Comment: 12 pack and 1/5th of liquor daily  . Drug use: No    Review of Systems  Constitutional: No fever/chills.  Positive for alcohol intoxication. Eyes: No visual changes. ENT: No sore throat. Cardiovascular: Denies chest pain. Respiratory: Denies shortness of breath. Gastrointestinal: No abdominal pain.  No nausea, no vomiting.  No diarrhea.  No constipation. Genitourinary: Negative for dysuria. Musculoskeletal: Negative for back pain. Skin: Negative for rash. Neurological: Negative for headaches, focal weakness or numbness.  ____________________________________________   PHYSICAL EXAM:  VITAL SIGNS: ED Triage Vitals [11/13/20 1950]  Enc Vitals Group     BP      Pulse      Resp      Temp      Temp src      SpO2      Weight 257 lb 15 oz (117 kg)     Height 5\' 11"  (1.803 m)     Head Circumference      Peak Flow      Pain Score      Pain Loc      Pain Edu?      Excl. in GC?     Constitutional: Awake and alert, intoxicated appearing. Eyes: Conjunctivae are normal. Head: Atraumatic. Nose: No congestion/rhinnorhea. Mouth/Throat: Mucous membranes are moist. Neck: Normal ROM Cardiovascular: Normal rate, regular rhythm. Grossly normal heart sounds. Respiratory: Normal respiratory  effort.  No retractions. Lungs CTAB. Gastrointestinal: Soft and nontender. No distention. Genitourinary: deferred Musculoskeletal: No lower extremity tenderness nor edema. Neurologic:  Normal speech and language. No gross focal neurologic deficits are appreciated. Skin:  Skin is warm, dry and intact. No rash noted. Psychiatric: Mood and affect are normal. Speech and behavior are normal.  ____________________________________________   LABS (all labs ordered are listed, but only abnormal results are displayed)  Labs Reviewed  CBC WITH DIFFERENTIAL/PLATELET - Abnormal; Notable for the following components:      Result Value   Platelets 124 (*)    All other components within normal limits  COMPREHENSIVE METABOLIC PANEL - Abnormal; Notable for the following components:   Potassium 3.2 (*)  Glucose, Bld 148 (*)    BUN 5 (*)    Creatinine, Ser 0.52 (*)    Calcium 8.6 (*)    AST 70 (*)    All other components within normal limits  ETHANOL - Abnormal; Notable for the following components:   Alcohol, Ethyl (B) 370 (*)    All other components within normal limits  URINE DRUG SCREEN, QUALITATIVE ([facility] ONLY)     PROCEDURES  Procedure(s) performed (including Critical Care):  Procedures   ____________________________________________   INITIAL IMPRESSION / ASSESSMENT AND PLAN / ED COURSE       60 year old male with past medical history of hypertension, diabetes, chronic back pain, and alcohol abuse who presents to the ED for alcohol intoxication, also found by EMS to be hypothermic with a temp of 95.  He was changed out of his cold wet close and we will redress with warm blankets for his mild hypothermia.  Patient also noted to be hyperglycemic with EMS and we will hydrate with IV fluids, screen labs for DKA or other electrolyte abnormality.  If work-up is unremarkable, patient will be observed until he is clinically sober.  Lab work is unremarkable and patient's temperature is  gradually improving with warm blankets and IV fluids.  Patient remains quite intoxicated and somnolent.  Patient turned over to oncoming provider pending further observation until he is clinically sober.      ____________________________________________   FINAL CLINICAL IMPRESSION(S) / ED DIAGNOSES  Final diagnoses:  Alcoholic intoxication without complication [facility])     ED Discharge Orders    None       Note:  This document was prepared using Dragon voice recognition software and may include unintentional dictation errors.   Chesley Noon, MD 11/13/20 2253

## 2020-11-13 NOTE — ED Notes (Signed)
Pt moved up in bed, repositioned, provided with meal tray and cup of water   Pt appears improved, no longer cold to the touch. Denies further needs

## 2020-11-13 NOTE — ED Notes (Signed)
Pt continues sleeping at this time.

## 2020-11-13 NOTE — ED Notes (Signed)
EDP notified of critical ETOH value. No orders received at this time

## 2020-11-14 ENCOUNTER — Other Ambulatory Visit: Payer: Self-pay

## 2020-11-14 ENCOUNTER — Encounter: Payer: Self-pay | Admitting: Emergency Medicine

## 2020-11-14 ENCOUNTER — Emergency Department
Admission: EM | Admit: 2020-11-14 | Discharge: 2020-11-15 | Disposition: A | Discharge status: Home / Self Care | Payer: No Typology Code available for payment source | Source: Home / Self Care | Attending: Emergency Medicine | Admitting: Emergency Medicine

## 2020-11-14 DIAGNOSIS — Z7984 Long term (current) use of oral hypoglycemic drugs: Secondary | ICD-10-CM | POA: Insufficient documentation

## 2020-11-14 DIAGNOSIS — F10129 Alcohol abuse with intoxication, unspecified: Secondary | ICD-10-CM | POA: Insufficient documentation

## 2020-11-14 DIAGNOSIS — Z79899 Other long term (current) drug therapy: Secondary | ICD-10-CM | POA: Insufficient documentation

## 2020-11-14 DIAGNOSIS — E119 Type 2 diabetes mellitus without complications: Secondary | ICD-10-CM | POA: Insufficient documentation

## 2020-11-14 DIAGNOSIS — Y908 Blood alcohol level of 240 mg/100 ml or more: Secondary | ICD-10-CM | POA: Insufficient documentation

## 2020-11-14 DIAGNOSIS — I10 Essential (primary) hypertension: Secondary | ICD-10-CM | POA: Insufficient documentation

## 2020-11-14 DIAGNOSIS — F10920 Alcohol use, unspecified with intoxication, uncomplicated: Secondary | ICD-10-CM

## 2020-11-14 LAB — COMPREHENSIVE METABOLIC PANEL
ALT: 44 U/L (ref 0–44)
AST: 70 U/L — ABNORMAL HIGH (ref 15–41)
Albumin: 3.5 g/dL (ref 3.5–5.0)
Alkaline Phosphatase: 75 U/L (ref 38–126)
Anion gap: 10 (ref 5–15)
BUN: <5 mg/dL — ABNORMAL LOW (ref 6–20)
CO2: 22 mmol/L (ref 22–32)
Calcium: 8.3 mg/dL — ABNORMAL LOW (ref 8.9–10.3)
Chloride: 103 mmol/L (ref 98–111)
Creatinine, Ser: 0.74 mg/dL (ref 0.61–1.24)
GFR, Estimated: >60 mL/min (ref >60)
Glucose, Bld: 112 mg/dL — ABNORMAL HIGH (ref 70–99)
Potassium: 3.5 mmol/L (ref 3.5–5.1)
Sodium: 135 mmol/L (ref 135–145)
Total Bilirubin: 1.3 mg/dL — ABNORMAL HIGH (ref 0.3–1.2)
Total Protein: 6.6 g/dL (ref 6.5–8.1)

## 2020-11-14 LAB — CBC
HCT: 36.5 % — ABNORMAL LOW (ref 39.0–52.0)
Hemoglobin: 12.8 g/dL — ABNORMAL LOW (ref 13.0–17.0)
MCH: 31.8 pg (ref 26.0–34.0)
MCHC: 35.1 g/dL (ref 30.0–36.0)
MCV: 90.6 fL (ref 80.0–100.0)
Platelets: 83 10*3/uL — ABNORMAL LOW (ref 150–400)
RBC: 4.03 MIL/uL — ABNORMAL LOW (ref 4.22–5.81)
RDW: 15.2 % (ref 11.5–15.5)
WBC: 6.6 10*3/uL (ref 4.0–10.5)
nRBC: 0 % (ref 0.0–0.2)

## 2020-11-14 LAB — ETHANOL: Alcohol, Ethyl (B): 272 mg/dL — ABNORMAL HIGH (ref <10)

## 2020-11-14 MED ORDER — THIAMINE HCL 100 MG PO TABS: 100.0000 mg | ORAL_TABLET | Freq: Every day | ORAL | Status: DC

## 2020-11-14 MED ORDER — LORAZEPAM 2 MG/ML IJ SOLN
0.0000 mg | Freq: Four times a day (QID) | INTRAMUSCULAR | Status: DC
  Filled 2020-11-14: qty 1

## 2020-11-14 MED ORDER — SODIUM CHLORIDE 0.9 % IV BOLUS
1000.0000 mL | Freq: Once | INTRAVENOUS | Status: AC
  Administered 2020-11-14: 1000 mL via INTRAVENOUS

## 2020-11-14 MED ORDER — THIAMINE HCL 100 MG PO TABS
100.0000 mg | ORAL_TABLET | Freq: Every day | ORAL | Status: DC
  Administered 2020-11-14: 100 mg via ORAL

## 2020-11-14 MED ORDER — LORAZEPAM 2 MG/ML IJ SOLN
0.0000 mg | Freq: Four times a day (QID) | INTRAMUSCULAR | Status: DC
  Administered 2020-11-14: 2 mg via INTRAVENOUS
  Filled 2020-11-14: qty 1

## 2020-11-14 NOTE — ED Notes (Signed)
Pt resting at this time, call bell within reach, pt placed back on monitors. Given ice water, denies other needs

## 2020-11-14 NOTE — ED Notes (Signed)
Report to heather, rn

## 2020-11-14 NOTE — ED Notes (Signed)
Pt sleeping. 

## 2020-11-14 NOTE — ED Provider Notes (Signed)
-----------------------------------------   6:00 AM on 11/14/2020 -----------------------------------------  Patient awake, desires alcohol detox.  He is tachycardic.  Will infuse IV fluids, placed on CIWA scale.   Irean Hong, MD 11/14/20 0600

## 2020-11-14 NOTE — ED Notes (Signed)
Pt walked to the desk for a second time, states that he thanks [place] for caring for him and that hes leaving. Pt visualized walking without difficulty.

## 2020-11-14 NOTE — ED Notes (Signed)
Hourly rounding reveals patient in room. No complaints, stable, in no acute distress. Q15 minute rounds and monitoring via Rover and Officer to continue.   

## 2020-11-14 NOTE — ED Provider Notes (Signed)
[facility] Emergency Department Provider Note  Time seen: 8:07 PM  I have reviewed the triage vital signs and the nursing notes.   HISTORY  Chief Complaint Alcohol Intoxication   HPI Gregory Duran is a 60 y.o. male with a past medical history of anxiety, chronic pain, diabetes, hypertension, presents to the emergency department with alcohol intoxication.   According to report a bystander brought the patient to the emergency department after finding the patient outside covered in urine and feces.  Patient admits to significant alcohol intoxication, denies any drug use.  Patient does awaken to voice, answers questions.  Has no specific medical complaint.  Past Medical History:  Diagnosis Date  . Anxiety   . Chronic back pain   . Chronic shoulder pain   . Diabetes mellitus (HCC)   . Hepatitis C    Patient states that he does not have hep C.  Will bring test results.  . Hypertension   . Sciatica   . Scoliosis   . Sleep apnea   . Spinal stenosis     Patient Active Problem List   Diagnosis Date Noted  . Nephrolithiasis 08/31/2020  . Abdominal mass 07/18/2020  . History of hepatitis C 07/18/2020  . Ear fullness, bilateral 07/18/2020  . GERD (gastroesophageal reflux disease) 06/29/2019  . Gastroesophageal reflux disease with esophagitis   . Acute blood loss anemia   . Controlled type 2 diabetes mellitus with complication, without long-term current use of insulin (HCC)   . Severe dehydration 11/24/2015  . AKI (acute kidney injury) (HCC) 11/24/2015  . Melena 11/24/2015  . Hyponatremia 11/24/2015  . Leukocytosis 11/24/2015  . GI bleed 11/24/2015  . Hypertension 05/05/2012  . Diabetes mellitus (HCC) 05/05/2012  . Chronic low back pain 05/05/2012  . Obesity 05/05/2012    Past Surgical History:  Procedure Laterality Date  . COLONOSCOPY WITH PROPOFOL N/A 08/10/2020   Procedure: COLONOSCOPY WITH PROPOFOL;  Surgeon: Dolores Frame, MD;   [facility];  Service: Gastroenterology;  Laterality: N/A;  11:15  . ESOPHAGOGASTRODUODENOSCOPY (EGD) WITH PROPOFOL N/A 11/25/2015   Procedure: ESOPHAGOGASTRODUODENOSCOPY (EGD) WITH PROPOFOL;  Surgeon: Malissa Hippo, MD;  Location: [facility];  Service: Endoscopy;  Laterality: N/A;  . KIDNEY STONE SURGERY      Prior to Admission medications   Medication Sig Start Date End Date Taking? Authorizing Provider  chlordiazePOXIDE (LIBRIUM) 25 MG capsule Take 1 capsule (25 mg total) by mouth 5 (five) times daily for 1 day, THEN 1 capsule (25 mg total) 4 (four) times daily for 1 day, THEN 1 capsule (25 mg total) 3 (three) times daily for 1 day, THEN 1 capsule (25 mg total) 2 (two) times daily for 1 day, THEN 1 capsule (25 mg total) daily for 1 day. 11/12/20 11/17/20  Chesley Noon, MD  gabapentin (NEURONTIN) 300 MG capsule Take 1 capsule (300 mg total) by mouth 3 (three) times daily for 5 days. 02/29/20 03/05/20  Willy Eddy, MD  metFORMIN (GLUCOPHAGE-XR) 500 MG 24 hr tablet Take 1,000 mg by mouth 2 (two) times daily.    [provider]  Oxycodone HCl 10 MG TABS Take 10 mg by mouth 4 (four) times daily as needed (pain).    [provider]  pantoprazole (PROTONIX) 40 MG tablet Take 1 tablet (40 mg total) by mouth daily before breakfast for 5 days. 02/29/20 03/05/20  Willy Eddy, MD  dicyclomine (BENTYL) 10 MG capsule Take 1 capsule (10 mg total) by mouth 2 (two)  times daily before a meal. Patient not taking: Reported on 10/12/2020 07/28/20 11/10/20  Dolores Frame, MD  glipiZIDE (GLUCOTROL) 10 MG tablet Take 2 tablets (20 mg total) by mouth daily for 5 days. Patient taking differently: Take 10 mg by mouth 2 (two) times daily. 02/29/20 11/10/20  Willy Eddy, MD  lisinopril-hydrochlorothiazide (ZESTORETIC) 20-25 MG tablet Take 1 tablet by mouth daily for 5 days. Patient taking differently: Take 1 tablet by mouth 2 (two) times daily. 02/29/20 11/10/20   Willy Eddy, MD    Allergies  Allergen Reactions  . Fish Allergy Nausea And Vomiting    Family History  Problem Relation Age of Onset  . Heart disease Mother   . Heart disease Father   . Healthy Sister   . Hypertension Brother   . Hypertension Brother   . Hypertension Brother   . Diabetes Brother     Social History Social History   Tobacco Use  . Smoking status: Never Smoker  . Smokeless tobacco: Never Used  Vaping Use  . Vaping Use: Never used  Substance Use Topics  . Alcohol use: Yes    Comment: 12 pack and 1/5th of liquor daily  . Drug use: No    Review of Systems Constitutional: Negative for fever Cardiovascular: Negative for chest pain. Respiratory: Negative for shortness of breath. Gastrointestinal: Negative for abdominal pain Musculoskeletal: States chronic back pain due to scoliosis and spinal stenosis. Neurological: Negative for headache All other ROS negative, although likely limited due to alcohol intoxication  ____________________________________________   PHYSICAL EXAM:  VITAL SIGNS: ED Triage Vitals  Enc Vitals Group     BP 11/14/20 1852 (!) 141/78     Pulse Rate 11/14/20 1852 78     Resp 11/14/20 1852 18     Temp 11/14/20 1852 98.2 F (36.8 C)     Temp Source 11/14/20 1852 Oral     SpO2 11/14/20 1852 98 %     Weight 11/14/20 1904 257 lb 15 oz (117 kg)     Height 11/14/20 1904 5\' 11"  (1.803 m)     Head Circumference --      Peak Flow --      Pain Score 11/14/20 1903 0     Pain Loc --      Pain Edu? --      Excl. in GC? --    Constitutional: Patient awakens to voice.  Answers questions appropriately mildly slurred speech.  Admits alcohol intoxication. Eyes: Normal exam ENT      Head: Normocephalic and atraumatic.      Mouth/Throat: Mucous membranes are moist. Cardiovascular: Normal rate, regular rhythm Respiratory: Normal respiratory effort without tachypnea nor retractions. Breath sounds are clear  Gastrointestinal: Soft and  nontender. No distention.  Musculoskeletal: Nontender with normal range of motion in all extremities.  Neurologic:   No gross focal neurologic deficits  Skin:  Skin is warm, dry and intact.  Psychiatric: Mood and affect are normal.   ____________________________________________   INITIAL IMPRESSION / ASSESSMENT AND PLAN / ED COURSE  Pertinent labs & imaging results that were available during my care of the patient were reviewed by me and considered in my medical decision making (see chart for details).   Patient presents emergency department brought by bystander due to alcohol intoxication with urine and feces on himself.  Patient's work-up is largely nonrevealing/baseline for the patient with an elevated alcohol level of 272.  No medical complaints besides chronic back pain per patient.  Given the patient's reassuring  work-up we will allow the patient to sleep to metabolize in the emergency department prior to discharge home.  Patient here voluntarily.  Patient is calmly resting in the emergency department.  We will discharge once sober or the patient is able to find a responsible party.  Laurina Bustle was evaluated in Emergency Department on 11/14/2020 for the symptoms described in the history of present illness. He was evaluated in the context of the global COVID-19 pandemic, which necessitated consideration that the patient might be at risk for infection with the SARS-CoV-2 virus that causes COVID-19. Institutional protocols and algorithms that pertain to the evaluation of patients at risk for COVID-19 are in a state of rapid change based on information released by regulatory bodies including the CDC and federal and state organizations. These policies and algorithms were followed during the patient's care in the ED.  ____________________________________________   FINAL CLINICAL IMPRESSION(S) / ED DIAGNOSES  Alcohol intoxication   Minna Antis, MD 11/14/20 2202

## 2020-11-14 NOTE — ED Notes (Signed)
Pt provided with meal tray and water per request. Per improving temp, bear hugger removed and warm blankets provided.   Pt visibly less intoxicated. Pt does not remember the events occurring last night or on arrival. Pt thoughts are more coherent. Pt states he wants help for his alcohol problem, but wants to go from here into rehab directly due to a lack of self control upon release. Pt acknowledges that when he's discharged, he will probably go on another binge (though he states he does not want to)

## 2020-11-14 NOTE — ED Triage Notes (Signed)
Pt presents via private vehicle. Per first nurse, a bystander brought patient to ed. Pt reportedly found outside and had urinated and defecated on self. Pt reports no recollection of events. Pt asking this RN who brought him in. Pt cooperative, but appears intoxicated at this time.

## 2020-11-14 NOTE — ED Notes (Signed)
Report to include Situation, Background, Assessment, and Recommendations received from Annie RN. Patient alert and oriented, warm and dry, in no acute distress. Patient denies SI, HI, AVH and pain. Patient made aware of Q15 minute rounds and Rover and Officer presence for their safety. Patient instructed to come to me with needs or concerns. ° ° °

## 2020-11-14 NOTE — Discharge Instructions (Addendum)
Please call the number above if you want help with detox.  We have offered you detox services a number of times but you have left before the evaluation is completed. Return to the ER for worsening symptoms, persistent vomiting, difficulty breathing or other concerns.

## 2020-11-14 NOTE — ED Notes (Signed)
Pt cleansed of incontinent urine and sheets changed. Pt declines to leave cardiac leads in place at this time. Pt provided with po fluids and additional meal tray.

## 2020-11-14 NOTE — ED Notes (Signed)
Pt walked out cath removed intact tip.

## 2020-11-14 NOTE — ED Notes (Signed)
Pt came to the desk, removed his own IV, catheter intact. Bleeding controlled, gauze applied.

## 2020-11-15 ENCOUNTER — Emergency Department
Admission: EM | Admit: 2020-11-15 | Discharge: 2020-11-15 | Disposition: A | Discharge status: Home / Self Care | Payer: No Typology Code available for payment source | Attending: Emergency Medicine | Admitting: Emergency Medicine

## 2020-11-15 DIAGNOSIS — E119 Type 2 diabetes mellitus without complications: Secondary | ICD-10-CM | POA: Insufficient documentation

## 2020-11-15 DIAGNOSIS — Z79899 Other long term (current) drug therapy: Secondary | ICD-10-CM | POA: Insufficient documentation

## 2020-11-15 DIAGNOSIS — F10920 Alcohol use, unspecified with intoxication, uncomplicated: Secondary | ICD-10-CM

## 2020-11-15 DIAGNOSIS — F10129 Alcohol abuse with intoxication, unspecified: Secondary | ICD-10-CM | POA: Insufficient documentation

## 2020-11-15 DIAGNOSIS — I10 Essential (primary) hypertension: Secondary | ICD-10-CM | POA: Insufficient documentation

## 2020-11-15 DIAGNOSIS — Z7984 Long term (current) use of oral hypoglycemic drugs: Secondary | ICD-10-CM | POA: Insufficient documentation

## 2020-11-15 LAB — CBC
HCT: 36.3 % — ABNORMAL LOW (ref 39.0–52.0)
Hemoglobin: 12.6 g/dL — ABNORMAL LOW (ref 13.0–17.0)
MCH: 31.3 pg (ref 26.0–34.0)
MCHC: 34.7 g/dL (ref 30.0–36.0)
MCV: 90.1 fL (ref 80.0–100.0)
Platelets: 88 10*3/uL — ABNORMAL LOW (ref 150–400)
RBC: 4.03 MIL/uL — ABNORMAL LOW (ref 4.22–5.81)
RDW: 15 % (ref 11.5–15.5)
WBC: 8 10*3/uL (ref 4.0–10.5)
nRBC: 0 % (ref 0.0–0.2)

## 2020-11-15 LAB — COMPREHENSIVE METABOLIC PANEL
ALT: 42 U/L (ref 0–44)
AST: 55 U/L — ABNORMAL HIGH (ref 15–41)
Albumin: 3.6 g/dL (ref 3.5–5.0)
Alkaline Phosphatase: 80 U/L (ref 38–126)
Anion gap: 10 (ref 5–15)
BUN: <5 mg/dL — ABNORMAL LOW (ref 6–20)
CO2: 23 mmol/L (ref 22–32)
Calcium: 8.4 mg/dL — ABNORMAL LOW (ref 8.9–10.3)
Chloride: 104 mmol/L (ref 98–111)
Creatinine, Ser: 0.66 mg/dL (ref 0.61–1.24)
GFR, Estimated: >60 mL/min (ref >60)
Glucose, Bld: 171 mg/dL — ABNORMAL HIGH (ref 70–99)
Potassium: 3 mmol/L — ABNORMAL LOW (ref 3.5–5.1)
Sodium: 137 mmol/L (ref 135–145)
Total Bilirubin: 0.9 mg/dL (ref 0.3–1.2)
Total Protein: 6.3 g/dL — ABNORMAL LOW (ref 6.5–8.1)

## 2020-11-15 LAB — URINE DRUG SCREEN, QUALITATIVE (ARMC ONLY)
Amphetamines, Ur Screen: NOT DETECTED
Barbiturates, Ur Screen: NOT DETECTED
Benzodiazepine, Ur Scrn: NOT DETECTED
Cannabinoid 50 Ng, Ur ~~LOC~~: NOT DETECTED
Cocaine Metabolite,Ur ~~LOC~~: NOT DETECTED
MDMA (Ecstasy)Ur Screen: NOT DETECTED
Methadone Scn, Ur: NOT DETECTED
Opiate, Ur Screen: NOT DETECTED
Phencyclidine (PCP) Ur S: NOT DETECTED
Tricyclic, Ur Screen: NOT DETECTED

## 2020-11-15 LAB — ETHANOL: Alcohol, Ethyl (B): 326 mg/dL (ref <10)

## 2020-11-15 MED ORDER — POTASSIUM CHLORIDE CRYS ER 20 MEQ PO TBCR
40.0000 meq | EXTENDED_RELEASE_TABLET | Freq: Once | ORAL | Status: AC
  Administered 2020-11-15: 40 meq via ORAL
  Filled 2020-11-15: qty 2

## 2020-11-15 MED ORDER — LORAZEPAM 2 MG PO TABS
2.0000 mg | ORAL_TABLET | Freq: Once | ORAL | Status: AC
  Administered 2020-11-15: 2 mg via ORAL
  Filled 2020-11-15: qty 1

## 2020-11-15 NOTE — ED Notes (Signed)
He is intoxicated  - garbled speech - inaudible most of the time  He has urinated in the chair and it is dripping into a puddle onto the floor  - awakened pt to encourage him to go to the BR

## 2020-11-15 NOTE — ED Notes (Signed)
When told he was being discharged, this patient asked for his clothes back. This nurse explained that his clothes were in bags because they were covered in feces and urine from when he came in. The patient insisted on taking out the soiled clothing, removing the disposable scrubs that he was dressed in and putting the soiled clothing back on. The patient then walked out of the room without his discharge paperwork and departed the building. Charge nurse notified.

## 2020-11-15 NOTE — ED Triage Notes (Signed)
Pt comes ems after being found in the back of a car by worker at the store of the parking lot. Pt smells of ETOH, has urinated and defecated on himself. Arousable to voice. VSS with EMS.

## 2020-11-15 NOTE — ED Notes (Signed)
Pt cleaned, given new gown, clean chux. Belongings are soiled and placed in belongings bag 2/2.

## 2020-11-15 NOTE — ED Provider Notes (Signed)
-----------------------------------------   12:51 AM on 11/15/2020 -----------------------------------------  Patient is awake, alert, drinking liquids.  Calling for a ride home.  Reviewing patient's chart, it looks like he left yesterday prior to TTS evaluation for detox.  Will provide outpatient resources.  Strict return precautions given.  Patient verbalizes understanding agrees with plan of care.   ----------------------------------------- 4:08 AM on 11/15/2020 -----------------------------------------  Patient declined to wait in his room for his ride.  Declined offer for clean clothes as his clothing was soiled from defecation.  Ambulated with steady gait to the lobby to await his ride.   Irean Hong, MD 11/15/20 [phone]

## 2020-11-15 NOTE — ED Notes (Addendum)
Pt had 3 belonging bags.  2 with clothes and 1 with a pair of boots, placed at nurses station

## 2020-11-15 NOTE — ED Notes (Signed)
Pt departed before vitals and pain score were taken.

## 2020-11-15 NOTE — ED Provider Notes (Signed)
[facility] Emergency Department Provider Note   ____________________________________________   I have reviewed the triage vital signs and the nursing notes.   HISTORY  Chief Complaint Alcohol Intoxication   History limited by: Intoxication   HPI Gregory Duran is a 60 y.o. male who presents to the emergency department today because of concern for alcohol intoxication. The patient himself is unsure why he is here in the emergency department.   Records reviewed. Per medical record review patient has a history of ER visit last night for alcohol intoxication. Multiple recent ER visits for the same complaint.   Past Medical History:  Diagnosis Date  . Anxiety   . Chronic back pain   . Chronic shoulder pain   . Diabetes mellitus (HCC)   . Hepatitis C    Patient states that he does not have hep C.  Will bring test results.  . Hypertension   . Sciatica   . Scoliosis   . Sleep apnea   . Spinal stenosis     Patient Active Problem List   Diagnosis Date Noted  . Nephrolithiasis 08/31/2020  . Abdominal mass 07/18/2020  . History of hepatitis C 07/18/2020  . Ear fullness, bilateral 07/18/2020  . GERD (gastroesophageal reflux disease) 06/29/2019  . Gastroesophageal reflux disease with esophagitis   . Acute blood loss anemia   . Controlled type 2 diabetes mellitus with complication, without long-term current use of insulin (HCC)   . Severe dehydration 11/24/2015  . AKI (acute kidney injury) (HCC) 11/24/2015  . Melena 11/24/2015  . Hyponatremia 11/24/2015  . Leukocytosis 11/24/2015  . GI bleed 11/24/2015  . Hypertension 05/05/2012  . Diabetes mellitus (HCC) 05/05/2012  . Chronic low back pain 05/05/2012  . Obesity 05/05/2012    Past Surgical History:  Procedure Laterality Date  . COLONOSCOPY WITH PROPOFOL N/A 08/10/2020   Procedure: COLONOSCOPY WITH PROPOFOL;  Surgeon: Dolores Frame, MD;  [facility];  Service:  Gastroenterology;  Laterality: N/A;  11:15  . ESOPHAGOGASTRODUODENOSCOPY (EGD) WITH PROPOFOL N/A 11/25/2015   Procedure: ESOPHAGOGASTRODUODENOSCOPY (EGD) WITH PROPOFOL;  Surgeon: Malissa Hippo, MD;  Location: [facility];  Service: Endoscopy;  Laterality: N/A;  . KIDNEY STONE SURGERY      Prior to Admission medications   Medication Sig Start Date End Date Taking? Authorizing Provider  chlordiazePOXIDE (LIBRIUM) 25 MG capsule Take 1 capsule (25 mg total) by mouth 5 (five) times daily for 1 day, THEN 1 capsule (25 mg total) 4 (four) times daily for 1 day, THEN 1 capsule (25 mg total) 3 (three) times daily for 1 day, THEN 1 capsule (25 mg total) 2 (two) times daily for 1 day, THEN 1 capsule (25 mg total) daily for 1 day. 11/12/20 11/17/20  Chesley Noon, MD  gabapentin (NEURONTIN) 300 MG capsule Take 1 capsule (300 mg total) by mouth 3 (three) times daily for 5 days. 02/29/20 03/05/20  Willy Eddy, MD  metFORMIN (GLUCOPHAGE-XR) 500 MG 24 hr tablet Take 1,000 mg by mouth 2 (two) times daily.    [provider]  Oxycodone HCl 10 MG TABS Take 10 mg by mouth 4 (four) times daily as needed (pain).    [provider]  pantoprazole (PROTONIX) 40 MG tablet Take 1 tablet (40 mg total) by mouth daily before breakfast for 5 days. 02/29/20 03/05/20  Willy Eddy, MD  dicyclomine (BENTYL) 10 MG capsule Take 1 capsule (10 mg total) by mouth 2 (two) times daily before a meal. Patient not taking:  Reported on 10/12/2020 07/28/20 11/10/20  Dolores Frame, MD  glipiZIDE (GLUCOTROL) 10 MG tablet Take 2 tablets (20 mg total) by mouth daily for 5 days. Patient taking differently: Take 10 mg by mouth 2 (two) times daily. 02/29/20 11/10/20  Willy Eddy, MD  lisinopril-hydrochlorothiazide (ZESTORETIC) 20-25 MG tablet Take 1 tablet by mouth daily for 5 days. Patient taking differently: Take 1 tablet by mouth 2 (two) times daily. 02/29/20 11/10/20  Willy Eddy, MD    Allergies Fish  allergy  Family History  Problem Relation Age of Onset  . Heart disease Mother   . Heart disease Father   . Healthy Sister   . Hypertension Brother   . Hypertension Brother   . Hypertension Brother   . Diabetes Brother     Social History Social History   Tobacco Use  . Smoking status: Never Smoker  . Smokeless tobacco: Never Used  Vaping Use  . Vaping Use: Never used  Substance Use Topics  . Alcohol use: Yes    Comment: 12 pack and 1/5th of liquor daily  . Drug use: No    Review of Systems Unable to obtain reliable ROS secondary to intoxication. ____________________________________________   PHYSICAL EXAM:  VITAL SIGNS: ED Triage Vitals  Enc Vitals Group     BP 11/15/20 1119 (!) 131/94     Pulse Rate 11/15/20 1119 (!) 104     Resp 11/15/20 1119 18     Temp 11/15/20 1119 97.7 F (36.5 C)     Temp Source 11/15/20 1119 Oral     SpO2 11/15/20 1119 92 %     Weight 11/15/20 1117 260 lb 2.3 oz (118 kg)     Height 11/15/20 1117 5\' 11"  (1.803 m)     Head Circumference --      Peak Flow --      Pain Score 11/15/20 1117 0    Constitutional: Intoxicated Eyes: Conjunctivae are normal.  ENT      Head: Normocephalic and atraumatic.      Nose: No congestion/rhinnorhea.      Mouth/Throat: Mucous membranes are moist.      Neck: No stridor. Hematological/Lymphatic/Immunilogical: No cervical lymphadenopathy. Cardiovascular: Normal rate, regular rhythm.  No murmurs, rubs, or gallops.  Respiratory: Normal respiratory effort without tachypnea nor retractions. Breath sounds are clear and equal bilaterally. No wheezes/rales/rhonchi. Gastrointestinal: Soft and non tender. No rebound. No guarding.  Genitourinary: Deferred Musculoskeletal: Normal range of motion in all extremities. No lower extremity edema. Neurologic:  Intoxicated, moving all extremities.  Skin:  Skin is warm, dry and intact. No rash noted.  ____________________________________________    LABS (pertinent  positives/negatives)  Ethanol 326 CBC wbc 8.0, hgb 12.6, plt 88 CMP na 137, k 3.0, glu 171, cr 0.66  ____________________________________________   EKG  None  ____________________________________________    RADIOLOGY  None  ____________________________________________   PROCEDURES  Procedures  ____________________________________________   INITIAL IMPRESSION / ASSESSMENT AND PLAN / ED COURSE  Pertinent labs & imaging results that were available during my care of the patient were reviewed by me and considered in my medical decision making (see chart for details).   Patient presents to the emergency department with apparent alcohol intoxication. On exam patient appears intoxicated. Blood work is consistent with alcohol intoxication. Patient's potassium was low. Patient was given potassium here in the emergency department.   ___________________________________________   FINAL CLINICAL IMPRESSION(S) / ED DIAGNOSES  Final diagnoses:  Alcoholic intoxication without complication (HCC)     Note: This  dictation was prepared with Dragon dictation. Any transcriptional errors that result from this process are unintentional     Phineas Semen, MD 11/15/20 1414

## 2020-11-15 NOTE — Discharge Instructions (Addendum)
Please seek medical attention for any high fevers, chest pain, shortness of breath, change in behavior, persistent vomiting, bloody stool or any other new or concerning symptoms.  

## 2020-11-16 ENCOUNTER — Encounter (HOSPITAL_COMMUNITY): Admission: RE | Admit: 2020-11-16 | Payer: Medicaid Other | Source: Ambulatory Visit

## 2020-11-22 ENCOUNTER — Encounter (HOSPITAL_COMMUNITY): Admission: RE | Payer: Self-pay | Source: Home / Self Care

## 2020-11-22 ENCOUNTER — Ambulatory Visit (HOSPITAL_COMMUNITY): Admission: RE | Admit: 2020-11-22 | Payer: Medicaid Other | Source: Home / Self Care | Admitting: Urology

## 2020-11-22 DIAGNOSIS — N200 Calculus of kidney: Secondary | ICD-10-CM

## 2020-11-22 SURGERY — CYSTOURETEROSCOPY, WITH RETROGRADE PYELOGRAM AND STENT INSERTION
Anesthesia: General | Laterality: Right

## 2020-11-28 ENCOUNTER — Other Ambulatory Visit: Payer: Medicaid Other | Admitting: Urology

## 2020-11-28 DIAGNOSIS — N200 Calculus of kidney: Secondary | ICD-10-CM

## 2020-12-27 ENCOUNTER — Ambulatory Visit: Payer: Self-pay | Admitting: Physician Assistant

## 2021-01-11 ENCOUNTER — Telehealth: Payer: Self-pay

## 2021-01-11 NOTE — Telephone Encounter (Signed)
Darl Pikes - Medical Services Coordinator for [facility].  Needing to reschedule pt's Pre Admission testing, Cystoscopy Procedure and Cysto.  Please call back on direct line:  [phone].  Thanks, Rosey Bath

## 2021-01-12 NOTE — Telephone Encounter (Signed)
Left message on nurse telephone number to call back. A.L. RN, wants to know if pt can do surgical procedure on May 19th or it will have to be in June.

## 2021-01-17 NOTE — Telephone Encounter (Signed)
Called Gregory Duran again at number listed. Left message to return call.

## 2021-01-18 NOTE — Telephone Encounter (Signed)
I called and spoke with Gregory Duran, new date for June 2nd given for surgery date. Address to [facility] given as well. Gregory Duran understands she will receive another call for pre- op and covid time.

## 2021-02-22 ENCOUNTER — Encounter (HOSPITAL_COMMUNITY): Payer: Self-pay

## 2021-02-22 ENCOUNTER — Other Ambulatory Visit: Payer: Self-pay

## 2021-02-22 ENCOUNTER — Encounter (HOSPITAL_COMMUNITY)
Admission: RE | Admit: 2021-02-22 | Discharge: 2021-02-22 | Disposition: A | Discharge status: Home / Self Care | Payer: Medicaid Other | Source: Ambulatory Visit | Attending: Urology | Admitting: Urology

## 2021-02-22 NOTE — Pre-Procedure Instructions (Signed)
Spoke with Darl Pikes at the medical office [facility] to go over preop information. She verbalizes understanding of all. She does state that there is a possibility that patient may be released from their facility before his surgery. She took my name and number and will call us if this occurs, so we can contact patient about surgery.

## 2021-03-02 ENCOUNTER — Ambulatory Visit (HOSPITAL_COMMUNITY): Payer: Medicaid Other

## 2021-03-02 ENCOUNTER — Ambulatory Visit (HOSPITAL_COMMUNITY)
Admission: RE | Admit: 2021-03-02 | Discharge: 2021-03-02 | Payer: Medicaid Other | Attending: Urology | Admitting: Urology

## 2021-03-02 ENCOUNTER — Ambulatory Visit (HOSPITAL_COMMUNITY): Payer: Medicaid Other | Admitting: Anesthesiology

## 2021-03-02 ENCOUNTER — Encounter (HOSPITAL_COMMUNITY): Payer: Self-pay | Admitting: Urology

## 2021-03-02 ENCOUNTER — Encounter (HOSPITAL_COMMUNITY): Admission: RE | Payer: Self-pay | Source: Home / Self Care | Attending: Urology

## 2021-03-02 ENCOUNTER — Other Ambulatory Visit (HOSPITAL_COMMUNITY)
Admission: RE | Admit: 2021-03-02 | Discharge: 2021-03-02 | Disposition: A | Discharge status: Home / Self Care | Payer: Medicaid Other | Source: Ambulatory Visit | Attending: Urology | Admitting: Urology

## 2021-03-02 DIAGNOSIS — Z8249 Family history of ischemic heart disease and other diseases of the circulatory system: Secondary | ICD-10-CM | POA: Insufficient documentation

## 2021-03-02 DIAGNOSIS — Z91013 Allergy to seafood: Secondary | ICD-10-CM | POA: Insufficient documentation

## 2021-03-02 DIAGNOSIS — Z833 Family history of diabetes mellitus: Secondary | ICD-10-CM | POA: Insufficient documentation

## 2021-03-02 DIAGNOSIS — N200 Calculus of kidney: Secondary | ICD-10-CM | POA: Diagnosis present

## 2021-03-02 DIAGNOSIS — Z01812 Encounter for preprocedural laboratory examination: Secondary | ICD-10-CM | POA: Insufficient documentation

## 2021-03-02 DIAGNOSIS — Z20822 Contact with and (suspected) exposure to covid-19: Secondary | ICD-10-CM | POA: Diagnosis not present

## 2021-03-02 DIAGNOSIS — E119 Type 2 diabetes mellitus without complications: Secondary | ICD-10-CM | POA: Insufficient documentation

## 2021-03-02 HISTORY — PX: CYSTOSCOPY WITH RETROGRADE PYELOGRAM, URETEROSCOPY AND STENT PLACEMENT: SHX5789

## 2021-03-02 HISTORY — PX: HOLMIUM LASER APPLICATION: SHX5852

## 2021-03-02 LAB — BASIC METABOLIC PANEL
Anion gap: 8 (ref 5–15)
BUN: 9 mg/dL (ref 6–20)
CO2: 27 mmol/L (ref 22–32)
Calcium: 9 mg/dL (ref 8.9–10.3)
Chloride: 102 mmol/L (ref 98–111)
Creatinine, Ser: 0.58 mg/dL — ABNORMAL LOW (ref 0.61–1.24)
GFR, Estimated: >60 mL/min (ref >60)
Glucose, Bld: 133 mg/dL — ABNORMAL HIGH (ref 70–99)
Potassium: 3.5 mmol/L (ref 3.5–5.1)
Sodium: 137 mmol/L (ref 135–145)

## 2021-03-02 LAB — GLUCOSE, CAPILLARY: Glucose-Capillary: 136 mg/dL — ABNORMAL HIGH (ref 70–99)

## 2021-03-02 LAB — SARS CORONAVIRUS 2 BY RT PCR (HOSPITAL ORDER, PERFORMED IN ~~LOC~~ HOSPITAL LAB): SARS Coronavirus 2: NEGATIVE

## 2021-03-02 SURGERY — CYSTOURETEROSCOPY, WITH RETROGRADE PYELOGRAM AND STENT INSERTION
Anesthesia: General | Site: Ureter | Laterality: Right

## 2021-03-02 MED ORDER — MIDAZOLAM HCL 2 MG/2ML IJ SOLN
INTRAMUSCULAR | Status: AC
  Filled 2021-03-02: qty 2

## 2021-03-02 MED ORDER — GLYCOPYRROLATE 0.2 MG/ML IJ SOLN
INTRAMUSCULAR | Status: DC | PRN
  Administered 2021-03-02: .2 mg via INTRAVENOUS

## 2021-03-02 MED ORDER — GLYCOPYRROLATE PF 0.2 MG/ML IJ SOSY
PREFILLED_SYRINGE | INTRAMUSCULAR | Status: AC
  Filled 2021-03-02: qty 1

## 2021-03-02 MED ORDER — ONDANSETRON HCL 4 MG/2ML IJ SOLN: 4.0000 mg | Freq: Once | INTRAMUSCULAR | Status: DC | PRN

## 2021-03-02 MED ORDER — CHLORHEXIDINE GLUCONATE 0.12 % MT SOLN: 15.0000 mL | Freq: Once | OROMUCOSAL | Status: DC

## 2021-03-02 MED ORDER — HYDROCODONE-ACETAMINOPHEN 5-325 MG PO TABS
ORAL_TABLET | ORAL | Status: AC
  Filled 2021-03-02: qty 1

## 2021-03-02 MED ORDER — LIDOCAINE HCL (CARDIAC) PF 100 MG/5ML IV SOSY
PREFILLED_SYRINGE | INTRAVENOUS | Status: DC | PRN
  Administered 2021-03-02: 80 mg via INTRAVENOUS

## 2021-03-02 MED ORDER — WATER FOR IRRIGATION, STERILE IR SOLN
Status: DC | PRN
  Administered 2021-03-02: 500 mL

## 2021-03-02 MED ORDER — DIATRIZOATE MEGLUMINE 30 % UR SOLN
URETHRAL | Status: AC
  Filled 2021-03-02: qty 100

## 2021-03-02 MED ORDER — SODIUM CHLORIDE 0.9 % IR SOLN
Status: DC | PRN
  Administered 2021-03-02 (×2): 3000 mL via INTRAVESICAL

## 2021-03-02 MED ORDER — FENTANYL CITRATE (PF) 100 MCG/2ML IJ SOLN
25.0000 ug | INTRAMUSCULAR | Status: DC | PRN
  Administered 2021-03-02: 50 ug via INTRAVENOUS
  Filled 2021-03-02: qty 2

## 2021-03-02 MED ORDER — LACTATED RINGERS IV SOLN: INTRAVENOUS | Status: DC

## 2021-03-02 MED ORDER — HYDROCODONE-ACETAMINOPHEN 5-325 MG PO TABS: 1.0000 | ORAL_TABLET | ORAL | 0 refills | Status: AC | PRN

## 2021-03-02 MED ORDER — PROPOFOL 10 MG/ML IV BOLUS
INTRAVENOUS | Status: DC | PRN
  Administered 2021-03-02: 150 mg via INTRAVENOUS
  Administered 2021-03-02: 50 mg via INTRAVENOUS

## 2021-03-02 MED ORDER — ONDANSETRON HCL 4 MG/2ML IJ SOLN
INTRAMUSCULAR | Status: DC | PRN
  Administered 2021-03-02: 4 mg via INTRAVENOUS

## 2021-03-02 MED ORDER — FENTANYL CITRATE (PF) 250 MCG/5ML IJ SOLN
INTRAMUSCULAR | Status: AC
  Filled 2021-03-02: qty 5

## 2021-03-02 MED ORDER — HYDROCODONE-ACETAMINOPHEN 5-325 MG PO TABS
1.0000 | ORAL_TABLET | Freq: Once | ORAL | Status: AC
Start: 2021-03-02 — End: 2021-03-02
  Administered 2021-03-02: 1 via ORAL

## 2021-03-02 MED ORDER — DIATRIZOATE MEGLUMINE 30 % UR SOLN
URETHRAL | Status: DC | PRN
  Administered 2021-03-02: 7 mL via URETHRAL

## 2021-03-02 MED ORDER — ONDANSETRON HCL 4 MG/2ML IJ SOLN
INTRAMUSCULAR | Status: AC
  Filled 2021-03-02: qty 2

## 2021-03-02 MED ORDER — ORAL CARE MOUTH RINSE: 15.0000 mL | Freq: Once | OROMUCOSAL | Status: DC

## 2021-03-02 MED ORDER — LACTATED RINGERS IV SOLN: INTRAVENOUS | Status: DC | PRN

## 2021-03-02 MED ORDER — CEFAZOLIN SODIUM-DEXTROSE 2-4 GM/100ML-% IV SOLN
2.0000 g | INTRAVENOUS | Status: AC
  Administered 2021-03-02: 2 g via INTRAVENOUS
  Filled 2021-03-02: qty 100

## 2021-03-02 MED ORDER — MIDAZOLAM HCL 5 MG/5ML IJ SOLN
INTRAMUSCULAR | Status: DC | PRN
  Administered 2021-03-02 (×2): 1 mg via INTRAVENOUS

## 2021-03-02 MED ORDER — FENTANYL CITRATE (PF) 100 MCG/2ML IJ SOLN
INTRAMUSCULAR | Status: DC | PRN
  Administered 2021-03-02 (×2): 25 ug via INTRAVENOUS
  Administered 2021-03-02: 50 ug via INTRAVENOUS
  Administered 2021-03-02 (×2): 25 ug via INTRAVENOUS
  Administered 2021-03-02: 50 ug via INTRAVENOUS

## 2021-03-02 SURGICAL SUPPLY — 26 items
BAG DRAIN URO TABLE W/ADPT NS (BAG) ×2 IMPLANT
BAG DRN 8 ADPR NS SKTRN CSTL (BAG) ×1
BAG HAMPER (MISCELLANEOUS) ×2 IMPLANT
CATH INTERMIT  6FR 70CM (CATHETERS) ×2 IMPLANT
CLOTH BEACON ORANGE TIMEOUT ST (SAFETY) ×2 IMPLANT
DECANTER SPIKE VIAL GLASS SM (MISCELLANEOUS) ×2 IMPLANT
EXTRACTOR STONE NITINOL NGAGE (UROLOGICAL SUPPLIES) ×2 IMPLANT
GLOVE BIO SURGEON STRL SZ8 (GLOVE) ×2 IMPLANT
GLOVE SURG UNDER POLY LF SZ7 (GLOVE) ×4 IMPLANT
GOWN STRL REUS W/TWL LRG LVL3 (GOWN DISPOSABLE) ×2 IMPLANT
GOWN STRL REUS W/TWL XL LVL3 (GOWN DISPOSABLE) ×2 IMPLANT
GUIDEWIRE STR DUAL SENSOR (WIRE) ×2 IMPLANT
GUIDEWIRE STR ZIPWIRE 035X150 (MISCELLANEOUS) ×2 IMPLANT
IV NS IRRIG 3000ML ARTHROMATIC (IV SOLUTION) ×4 IMPLANT
KIT TURNOVER CYSTO (KITS) ×2 IMPLANT
MANIFOLD NEPTUNE II (INSTRUMENTS) ×2 IMPLANT
PACK CYSTO (CUSTOM PROCEDURE TRAY) ×2 IMPLANT
PAD ARMBOARD 7.5X6 YLW CONV (MISCELLANEOUS) ×2 IMPLANT
SHEATH URETERAL 12FRX35CM (MISCELLANEOUS) ×2 IMPLANT
STENT URET 6FRX26 CONTOUR (STENTS) ×2 IMPLANT
SYR 10ML LL (SYRINGE) ×2 IMPLANT
SYR CONTROL 10ML LL (SYRINGE) ×2 IMPLANT
TOWEL OR 17X26 4PK STRL BLUE (TOWEL DISPOSABLE) ×2 IMPLANT
TRACTIP FLEXIVA PULS ID 200XHI (Laser) ×1 IMPLANT
TRACTIP FLEXIVA PULSE ID 200 (Laser) ×2
WATER STERILE IRR 500ML POUR (IV SOLUTION) ×2 IMPLANT

## 2021-03-02 NOTE — Anesthesia Procedure Notes (Signed)
Procedure Name: LMA Insertion Date/Time: 03/02/2021 11:11 AM Performed by: Chelsea Aus, CRNA Pre-anesthesia Checklist: Patient identified, Emergency Drugs available, Suction available and Patient being monitored Patient Re-evaluated:Patient Re-evaluated prior to induction Oxygen Delivery Method: Circle system utilized Preoxygenation: Pre-oxygenation with 100% oxygen Induction Type: IV induction Ventilation: Mask ventilation without difficulty LMA: LMA inserted LMA Size: 5.0 Number of attempts: 2 Placement Confirmation: positive ETCO2 and breath sounds checked- equal and bilateral Tube secured with: Tape Dental Injury: Teeth and Oropharynx as per pre-operative assessment

## 2021-03-02 NOTE — Op Note (Signed)
Preoperative diagnosis: right renal stone  Postoperative diagnosis: Same  Procedure: 1 cystoscopy 2. Right retrograde pyelography 3.  Intraoperative fluoroscopy, under one hour, with interpretation 4.  Right ureteroscopic stone manipulation with laser lithotripsy 5.  Right 6 x 26 JJ stent placement  Attending: Cleda Mccreedy  Anesthesia: General  Estimated blood loss: None  Drains: Right 6 x 26 JJ ureteral stent with tether  Specimens: stone for analysis  Antibiotics: ancef  Findings: right lower pole stone. No hydronephrosis. No masses/lesions in the bladder. Ureteral orifices in normal anatomic location.  Indications: Patient is a 60 year old male with a history of right renal stone and who has persistent right flank pain.  After discussing treatment options, he decided proceed with right ureteroscopic stone manipulation.  Procedure in detail: The patient was brought to the operating room and a brief timeout was done to ensure correct patient, correct procedure, correct site.  General anesthesia was administered patient was placed in dorsal lithotomy position.  Her genitalia was then prepped and draped in usual sterile fashion.  A rigid 22 French cystoscope was passed in the urethra and the bladder.  Bladder was inspected free masses or lesions.  the ureteral orifices were in the normal orthotopic locations.  a 6 french ureteral catheter was then instilled into the right ureteral orifice.  a gentle retrograde was obtained and findings noted above.  we then placed a zip wire through the ureteral catheter and advanced up to the renal pelvis.  we then removed the cystoscope and cannulated the right ureteral orifice with a semirigid ureteroscope.  No stone was found in the ureter. Once we reached the UPJ a sensor wire was advanced in to the renal pelvis. We then removed the ureteroscope and advanced am 12/14 x 35cm access sheath up to the renal pelvis. We then used the flexible  ureteroscope to perform nephroscopy. We encountered the stone in the lower pole.  Using a 242nm laser fiber the stone was fragmented and the fragments were then removed with a Ngage basket.    once all stone fragments were removed we then removed the access sheath under direct vision and noted no injury to the ureter. We then placed a 6 x 26 double-j ureteral stent over the original zip wire.  We then removed the wire and good coil was noted in the the renal pelvis under fluoroscopy and the bladder under direct vision. the bladder was then drained and this concluded the procedure which was well tolerated by patient.  Complications: None  Condition: Stable, extubated, transferred to PACU  Plan: Patient is to be discharged home as to follow-up in one week. He is to remove his stent in 72 hours by pulling the tether

## 2021-03-02 NOTE — Anesthesia Preprocedure Evaluation (Signed)
Anesthesia Evaluation  Patient identified by MRN, date of birth, ID band Patient awake    Reviewed: Allergy & Precautions, H&P , NPO status , Patient's Chart, lab work & pertinent test results, reviewed documented beta blocker date and time   Airway Mallampati: II  TM Distance: >3 FB Neck ROM: full    Dental no notable dental hx.    Pulmonary sleep apnea ,    Pulmonary exam normal breath sounds clear to auscultation       Cardiovascular Exercise Tolerance: Good hypertension, negative cardio ROS   Rhythm:regular Rate:Normal     Neuro/Psych PSYCHIATRIC DISORDERS Anxiety  Neuromuscular disease    GI/Hepatic GERD  Medicated,  Endo/Other  negative endocrine ROSdiabetes  Renal/GU Renal disease  negative genitourinary   Musculoskeletal   Abdominal   Peds  Hematology  (+) Blood dyscrasia, anemia ,   Anesthesia Other Findings   Reproductive/Obstetrics negative OB ROS                             Anesthesia Physical Anesthesia Plan  ASA: III  Anesthesia Plan: General   Post-op Pain Management:    Induction:   PONV Risk Score and Plan: Ondansetron  Airway Management Planned:   Additional Equipment:   Intra-op Plan:   Post-operative Plan:   Informed Consent: I have reviewed the patients History and Physical, chart, labs and discussed the procedure including the risks, benefits and alternatives for the proposed anesthesia with the patient or authorized representative who has indicated his/her understanding and acceptance.     Dental Advisory Given  Plan Discussed with: CRNA  Anesthesia Plan Comments:         Anesthesia Quick Evaluation

## 2021-03-02 NOTE — Anesthesia Postprocedure Evaluation (Signed)
Anesthesia Post Note  Patient: Gregory Duran  Procedure(s) Performed: CYSTOSCOPY WITH RETROGRADE PYELOGRAM, URETEROSCOPY AND STENT PLACEMENT (Right Ureter) HOLMIUM LASER APPLICATION (Right Ureter)  Patient location during evaluation: Phase II Anesthesia Type: General Level of consciousness: awake Pain management: pain level controlled Vital Signs Assessment: post-procedure vital signs reviewed and stable Respiratory status: spontaneous breathing and respiratory function stable Cardiovascular status: blood pressure returned to baseline and stable Postop Assessment: no headache and no apparent nausea or vomiting Anesthetic complications: no Comments: Late entry   No complications documented.   Last Vitals:  Vitals:   03/02/21 1245 03/02/21 1250  BP: 112/75 136/81  Pulse: 73 75  Resp: 18 18  Temp:  36.5 C  SpO2: 98% 98%    Last Pain:  Vitals:   03/02/21 1253  TempSrc:   PainSc: 10-Worst pain ever                 Windell Norfolk

## 2021-03-02 NOTE — Transfer of Care (Signed)
Immediate Anesthesia Transfer of Care Note  Patient: Gregory Duran  Procedure(s) Performed: CYSTOSCOPY WITH RETROGRADE PYELOGRAM, URETEROSCOPY AND STENT PLACEMENT (Right Ureter) HOLMIUM LASER APPLICATION (Right Ureter)  Patient Location: PACU  Anesthesia Type:General  Level of Consciousness: awake, alert , oriented and patient cooperative  Airway & Oxygen Therapy: Patient Spontanous Breathing  Post-op Assessment: Report given to RN  Post vital signs: Reviewed and stable  Last Vitals:  Vitals Value Taken Time  BP 135/77 03/02/21 1206  Temp    Pulse 88 03/02/21 1209  Resp 13 03/02/21 1209  SpO2 98 % 03/02/21 1209  Vitals shown include unvalidated device data.  Last Pain:  Vitals:   03/02/21 0746  TempSrc: Oral  PainSc: 8       Patients Stated Pain Goal: 5 (03/02/21 0746)  Complications: No complications documented.

## 2021-03-02 NOTE — H&P (Signed)
Urology Admission H&P  Chief Complaint: right renal calculus  History of Present Illness: [name] is a 60yo with a hx of a 1cm right renal calculus and is here for right ureteroscopic stone extraction. He has intermittent right flank pain. No other complaints today  Past Medical History:  Diagnosis Date  . Anxiety   . Chronic back pain   . Chronic shoulder pain   . Diabetes mellitus (HCC)   . Hepatitis C    Patient states that he does not have hep C.  Will bring test results.  . Hypertension   . Sciatica   . Scoliosis   . Sleep apnea   . Spinal stenosis    Past Surgical History:  Procedure Laterality Date  . COLONOSCOPY WITH PROPOFOL N/A 08/10/2020   Procedure: COLONOSCOPY WITH PROPOFOL;  Surgeon: Dolores Frame, MD;  Location: [facility];  Service: Gastroenterology;  Laterality: N/A;  11:15  . ESOPHAGOGASTRODUODENOSCOPY (EGD) WITH PROPOFOL N/A 11/25/2015   Procedure: ESOPHAGOGASTRODUODENOSCOPY (EGD) WITH PROPOFOL;  Surgeon: Malissa Hippo, MD;  Location: [facility];  Service: Endoscopy;  Laterality: N/A;  . KIDNEY STONE SURGERY      Home Medications:  Current Facility-Administered Medications  Medication Dose Route Frequency Provider Last Rate Last Admin  . ceFAZolin (ANCEF) IVPB 2g/100 mL premix  2 g Intravenous 30 min Pre-Op Raynold Blankenbaker, Mardene Celeste, MD       Allergies:  Allergies  Allergen Reactions  . Fish Allergy Nausea And Vomiting    Family History  Problem Relation Age of Onset  . Heart disease Mother   . Heart disease Father   . Healthy Sister   . Hypertension Brother   . Hypertension Brother   . Hypertension Brother   . Diabetes Brother    Social History:  reports that he has never smoked. He has never used smokeless tobacco. He reports current alcohol use. He reports that he does not use drugs.  Review of Systems  Genitourinary: Positive for flank pain.  All other systems reviewed and are negative.   Physical Exam:  Vital signs in  last 24 hours: Temp:  [97.9 F (36.6 C)] 97.9 F (36.6 C) (06/02 0746) Pulse Rate:  [71] 71 (06/02 0746) Resp:  [16] 16 (06/02 0746) BP: (120)/(78) 120/78 (06/02 0746) SpO2:  [100 %] 100 % (06/02 0746) Physical Exam Vitals reviewed.  Constitutional:      Appearance: Normal appearance.  HENT:     Head: Normocephalic and atraumatic.     Nose: Nose normal.     Mouth/Throat:     Mouth: Mucous membranes are dry.  Eyes:     Extraocular Movements: Extraocular movements intact.     Pupils: Pupils are equal, round, and reactive to light.  Cardiovascular:     Rate and Rhythm: Normal rate and regular rhythm.  Pulmonary:     Effort: Pulmonary effort is normal. No respiratory distress.  Abdominal:     General: Abdomen is flat. There is no distension.  Musculoskeletal:        General: No swelling. Normal range of motion.     Cervical back: Normal range of motion and neck supple.  Skin:    General: Skin is warm and dry.  Neurological:     General: No focal deficit present.     Mental Status: He is alert and oriented to person, place, and time.  Psychiatric:        Mood and Affect: Mood normal.  Behavior: Behavior normal.        Thought Content: Thought content normal.        Judgment: Judgment normal.     Laboratory Data:  Results for orders placed or performed during the hospital encounter of 03/02/21 (from the past 24 hour(s))  SARS Coronavirus 2 by RT PCR (hospital order, performed in [facility] hospital lab) Nasopharyngeal Nasopharyngeal Swab     Status: None   Collection Time: 03/02/21  7:09 AM   Specimen: Nasopharyngeal Swab  Result Value Ref Range   SARS Coronavirus 2 NEGATIVE NEGATIVE  Basic metabolic panel     Status: Abnormal   Collection Time: 03/02/21  8:49 AM  Result Value Ref Range   Sodium 137 135 - 145 mmol/L   Potassium 3.5 3.5 - 5.1 mmol/L   Chloride 102 98 - 111 mmol/L   CO2 27 22 - 32 mmol/L   Glucose, Bld 133 (H) 70 - 99 mg/dL   BUN 9 6 - 20 mg/dL    Creatinine, Ser 1.49 (L) 0.61 - 1.24 mg/dL   Calcium 9.0 8.9 - 70.2 mg/dL   GFR, Estimated >63 >78 mL/min   Anion gap 8 5 - 15   Recent Results (from the past 240 hour(s))  SARS Coronavirus 2 by RT PCR (hospital order, performed in [facility] hospital lab) Nasopharyngeal Nasopharyngeal Swab     Status: None   Collection Time: 03/02/21  7:09 AM   Specimen: Nasopharyngeal Swab  Result Value Ref Range Status   SARS Coronavirus 2 NEGATIVE NEGATIVE Final    Comment: (NOTE) SARS-CoV-2 target nucleic acids are NOT DETECTED.  The SARS-CoV-2 RNA is generally detectable in upper and lower respiratory specimens during the acute phase of infection. The lowest concentration of SARS-CoV-2 viral copies this assay can detect is 250 copies / mL. A negative result does not preclude SARS-CoV-2 infection and should not be used as the sole basis for treatment or other patient management decisions.  A negative result may occur with improper specimen collection / handling, submission of specimen other than nasopharyngeal swab, presence of viral mutation(s) within the areas targeted by this assay, and inadequate number of viral copies (<250 copies / mL). A negative result must be combined with clinical observations, patient history, and epidemiological information.  Fact Sheet for Patients:   BoilerBrush.com.cy  Fact Sheet for Healthcare Providers: https://pope.com/  This test is not yet approved or  cleared by the [place] FDA and has been authorized for detection and/or diagnosis of SARS-CoV-2 by FDA under an Emergency Use Authorization (EUA).  This EUA will remain in effect (meaning this test can be used) for the duration of the COVID-19 declaration under Section 564(b)(1) of the Act, 21 U.S.C. section 360bbb-3(b)(1), unless the authorization is terminated or revoked sooner.  Performed at [facility], [address] [place], [place]  [address]    Creatinine: Recent Labs    03/02/21 0849  CREATININE 0.58*   Baseline Creatinine: 0.58  Impression/Assessment:  60yo with right renal calculus  Plan:  -We discussed the management of kidney stones. These options include observation, ureteroscopy, shockwave lithotripsy (ESWL) and percutaneous nephrolithotomy (PCNL). We discussed which options are relevant to the patient's stone(s). We discussed the natural history of kidney stones as well as the complications of untreated stones and the impact on quality of life without treatment as well as with each of the above listed treatments. We also discussed the efficacy of each treatment in its ability to clear the stone burden. With  any of these management options I discussed the signs and symptoms of infection and the need for emergent treatment should these be experienced. For each option we discussed the ability of each procedure to clear the patient of their stone burden.   For observation I described the risks which include but are not limited to silent renal damage, life-threatening infection, need for emergent surgery, failure to pass stone and pain.   For ureteroscopy I described the risks which include bleeding, infection, damage to contiguous structures, positioning injury, ureteral stricture, ureteral avulsion, ureteral injury, need for prolonged ureteral stent, inability to perform ureteroscopy, need for an interval procedure, inability to clear stone burden, stent discomfort/pain, heart attack, stroke, pulmonary embolus and the inherent risks with general anesthesia.   For shockwave lithotripsy I described the risks which include arrhythmia, kidney contusion, kidney hemorrhage, need for transfusion, pain, inability to adequately break up stone, inability to pass stone fragments, Steinstrasse, infection associated with obstructing stones, need for alternate surgical procedure, need for repeat shockwave lithotripsy, MI, CVA, PE and  the inherent risks with anesthesia/conscious sedation.   For PCNL I described the risks including positioning injury, pneumothorax, hydrothorax, need for chest tube, inability to clear stone burden, renal laceration, arterial venous fistula or malformation, need for embolization of kidney, loss of kidney or renal function, need for repeat procedure, need for prolonged nephrostomy tube, ureteral avulsion, MI, CVA, PE and the inherent risks of general anesthesia.   - The patient would like to proceed with right ureteroscopic stone extraction  Wilkie Aye 03/02/2021, 10:45 AM

## 2021-03-02 NOTE — Discharge Instructions (Signed)
Ureteral Stent Implantation, Care After This sheet gives you information about how to care for yourself after your procedure. Your health care provider may also give you more specific instructions. If you have problems or questions, contact your health care provider. What can I expect after the procedure? After the procedure, it is common to have:  Nausea.  Mild pain when you urinate. You may feel this pain in your lower back or lower abdomen. The pain should stop within a few minutes after you urinate. This may last for up to 1 week.  A small amount of blood in your urine for several days. Follow these instructions at home: Medicines  Take over-the-counter and prescription medicines only as told by your health care provider.  If you were prescribed an antibiotic medicine, take it as told by your health care provider. Do not stop taking the antibiotic even if you start to feel better.  Do not drive for 24 hours if you were given a sedative during your procedure.  Ask your health care provider if the medicine prescribed to you requires you to avoid driving or using heavy machinery. Activity  Rest as told by your health care provider.  Avoid sitting for a long time without moving. Get up to take short walks every 1-2 hours. This is important to improve blood flow and breathing. Ask for help if you feel weak or unsteady.  Return to your normal activities as told by your health care provider. Ask your health care provider what activities are safe for you. General instructions  Watch for any blood in your urine. Call your health care provider if the amount of blood in your urine increases.  If you have a catheter: ? Follow instructions from your health care provider about taking care of your catheter and collection bag. ? Do not take baths, swim, or use a hot tub until your health care provider approves. Ask your health care provider if you may take showers. You may only be allowed to  take sponge baths.  Drink enough fluid to keep your urine pale yellow.  Do not use any products that contain nicotine or tobacco, such as cigarettes, e-cigarettes, and chewing tobacco. These can delay healing after surgery. If you need help quitting, ask your health care provider.  Keep all follow-up visits as told by your health care provider. This is important.   Contact a health care provider if:  You have pain that gets worse or does not get better with medicine, especially pain when you urinate.  You have difficulty urinating.  You feel nauseous or you vomit repeatedly during a period of more than 2 days after the procedure. Get help right away if:  Your urine is dark red or has blood clots in it.  You are leaking urine (have incontinence).  The end of the stent comes out of your urethra.  You cannot urinate.  You have sudden, sharp, or severe pain in your abdomen or lower back.  You have a fever.  You have swelling or pain in your legs.  You have difficulty breathing. Summary  After the procedure, it is common to have mild pain when you urinate that goes away within a few minutes after you urinate. This may last for up to 1 week.  Watch for any blood in your urine. Call your health care provider if the amount of blood in your urine increases.  Take over-the-counter and prescription medicines only as told by your health care provider.  Drink enough fluid to keep your urine pale yellow. This information is not intended to replace advice given to you by your health care provider. Make sure you discuss any questions you have with your health care provider. Document Revised: 06/24/2018 Document Reviewed: 06/25/2018 Elsevier Patient Education  2021 Elsevier Inc.    PLEASE REMOVE YOUR STENT IN 72 HOURS BY GENTLY PULLING THE STRING Removed on Sunday March 05, 2021     General Anesthesia, Adult, Care After This sheet gives you information about how to care for  yourself after your procedure. Your health care provider may also give you more specific instructions. If you have problems or questions, contact your health care provider. What can I expect after the procedure? After the procedure, the following side effects are common:  Pain or discomfort at the IV site.  Nausea.  Vomiting.  Sore throat.  Trouble concentrating.  Feeling cold or chills.  Feeling weak or tired.  Sleepiness and fatigue.  Soreness and body aches. These side effects can affect parts of the body that were not involved in surgery. Follow these instructions at home: For the time period you were told by your health care provider:  Rest.  Do not participate in activities where you could fall or become injured.  Do not drive or use machinery.  Do not drink alcohol.  Do not take sleeping pills or medicines that cause drowsiness.  Do not make important decisions or sign legal documents.  Do not take care of children on your own.   Eating and drinking  Follow any instructions from your health care provider about eating or drinking restrictions.  When you feel hungry, start by eating small amounts of foods that are soft and easy to digest (bland), such as toast. Gradually return to your regular diet.  Drink enough fluid to keep your urine pale yellow.  If you vomit, rehydrate by drinking water, juice, or clear broth. General instructions  If you have sleep apnea, surgery and certain medicines can increase your risk for breathing problems. Follow instructions from your health care provider about wearing your sleep device: ? Anytime you are sleeping, including during daytime naps. ? While taking prescription pain medicines, sleeping medicines, or medicines that make you drowsy.  Have a responsible adult stay with you for the time you are told. It is important to have someone help care for you until you are awake and alert.  Return to your normal activities as  told by your health care provider. Ask your health care provider what activities are safe for you.  Take over-the-counter and prescription medicines only as told by your health care provider.  If you smoke, do not smoke without supervision.  Keep all follow-up visits as told by your health care provider. This is important. Contact a health care provider if:  You have nausea or vomiting that does not get better with medicine.  You cannot eat or drink without vomiting.  You have pain that does not get better with medicine.  You are unable to pass urine.  You develop a skin rash.  You have a fever.  You have redness around your IV site that gets worse. Get help right away if:  You have difficulty breathing.  You have chest pain.  You have blood in your urine or stool, or you vomit blood. Summary  After the procedure, it is common to have a sore throat or nausea. It is also common to feel tired.  Have a responsible adult  stay with you for the time you are told. It is important to have someone help care for you until you are awake and alert.  When you feel hungry, start by eating small amounts of foods that are soft and easy to digest (bland), such as toast. Gradually return to your regular diet.  Drink enough fluid to keep your urine pale yellow.  Return to your normal activities as told by your health care provider. Ask your health care provider what activities are safe for you. This information is not intended to replace advice given to you by your health care provider. Make sure you discuss any questions you have with your health care provider. Document Revised: 06/02/2020 Document Reviewed: 12/31/2019 Elsevier Patient Education  2021 Elsevier Inc.    Acetaminophen; Hydrocodone tablets or capsules What is this medicine? ACETAMINOPHEN; HYDROCODONE (a set a MEE noe fen; hye droe KOE done) is a pain reliever. It is used to treat moderate to severe pain. This medicine may  be used for other purposes; ask your health care provider or pharmacist if you have questions. COMMON BRAND NAME(S): Anexsia, Bancap HC, Ceta-Plus, Co-Gesic, Comfortpak, Dolagesic, Du PontDolorex Forte, [address]/Fiscal ServicesDuoCet, [address], Hydrogesic, StoverLorcet, Lorcet HD, Lorcet Plus, Lortab, Margesic H, Maxidone, [place]co, Polygesic, [place]ic, [place]acet, Retail buyerVerdrocet, Vicodin, Vicodin ES, Vicodin HP, [place] BasemanXodol, Zydone What should I tell my health care provider before I take this medicine? They need to know if you have any of these conditions:  brain tumor  drug abuse or addiction  head injury  heart disease  if you often drink alcohol  kidney disease  liver disease  low adrenal gland function  lung disease, asthma, or breathing problems  seizures  stomach or intestine problems  taken an MAOI like Marplan, Nardil, or Parnate in the last 14 days  an unusual or allergic reaction to acetaminophen, hydrocodone, other medicines, foods, dyes, or preservatives  pregnant or trying to get pregnant  breast-feeding How should I use this medicine? Take this medicine by mouth with a glass of water. Follow the directions on the prescription label. You can take it with or without food. If it upsets your stomach, take it with food. Do not take your medicine more often than directed. A special MedGuide will be given to you by the pharmacist with each prescription and refill. Be sure to read this information carefully each time. Talk to your pediatrician regarding the use of this medicine in children. Special care may be needed. Overdosage: If you think you have taken too much of this medicine contact a poison control center or emergency room at once. NOTE: This medicine is only for you. Do not share this medicine with others. What if I miss a dose? If you miss a dose, take it as soon as you can. If it is almost time for your next dose, take only that dose. Do not take double or extra doses. What may interact with this  medicine? This medicine may interact with the following medications:  alcohol  antiviral medicines for HIV or AIDS  atropine  antihistamines for allergy, cough and cold  certain antibiotics like erythromycin, clarithromycin  certain medicines for anxiety or sleep  certain medicines for bladder problems like oxybutynin, tolterodine  certain medicines for depression like amitriptyline, fluoxetine, sertraline  certain medicines for fungal infections like ketoconazole and itraconazole  certain medicines for Parkinson's disease like benztropine, trihexyphenidyl  certain medicines for seizures like carbamazepine, phenobarbital, phenytoin, primidone  certain medicines for stomach problems like dicyclomine, hyoscyamine  certain medicines  for travel sickness like scopolamine  general anesthetics like halothane, isoflurane, methoxyflurane, propofol  ipratropium  local anesthetics like lidocaine, pramoxine, tetracaine  MAOIs like Carbex, Eldepryl, Marplan, Nardil, and Parnate  medicines that relax muscles for surgery  other medicines with acetaminophen  other narcotic medicines for pain or cough  phenothiazines like chlorpromazine, mesoridazine, prochlorperazine, thioridazine  rifampin This list may not describe all possible interactions. Give your health care provider a list of all the medicines, herbs, non-prescription drugs, or dietary supplements you use. Also tell them if you smoke, drink alcohol, or use illegal drugs. Some items may interact with your medicine. What should I watch for while using this medicine? Tell your health care provider if your pain does not go away, if it gets worse, or if you have new or a different type of pain. You may develop tolerance to this drug. Tolerance means that you will need a higher dose of the drug for pain relief. Tolerance is normal and is expected if you take this drug for a long time. There are different types of narcotic drugs  (opioids) for pain. If you take more than one type at the same time, you may have more side effects. Give your health care provider a list of all drugs you use. He or she will tell you how much drug to take. Do not take more drug than directed. Get emergency help right away if you have problems breathing. Do not suddenly stop taking your drug because you may develop a severe reaction. Your body becomes used to the drug. This does NOT mean you are addicted. Addiction is a behavior related to getting and using a drug for a nonmedical reason. If you have pain, you have a medical reason to take pain drug. Your health care provider will tell you how much drug to take. If your health care provider wants you to stop the drug, the dose will be slowly lowered over time to avoid any side effects. Talk to your health care provider about naloxone and how to get it. Naloxone is an emergency drug used for an opioid overdose. An overdose can happen if you take too much opioid. It can also happen if an opioid is taken with some other drugs or substances, like alcohol. Know the symptoms of an overdose, like trouble breathing, unusually tired or sleepy, or not being able to respond or wake up. Make sure to tell caregivers and close contacts where it is stored. Make sure they know how to use it. After naloxone is given, you must get emergency help right away. Naloxone is a temporary treatment. Repeat doses may be needed. Do not take other drugs that contain acetaminophen with this drug. Many non-prescription drugs contain acetaminophen. Always read labels carefully. If you have questions, ask your health care provider. If you take too much acetaminophen, get medical help right away. Too much acetaminophen can be very dangerous and cause liver damage. Even if you do not have symptoms, it is important to get help right away. You may get drowsy or dizzy. Do not drive, use machinery, or do anything that needs mental alertness until  you know how this drug affects you. Do not stand up or sit up quickly, especially if you are an older patient. This reduces the risk of dizzy or fainting spells. Alcohol may interfere with the effect of this drug. Avoid alcoholic drinks. This drug will cause constipation. If you do not have a bowel movement for 3 days, call your health  care provider. Your mouth may get dry. Chewing sugarless gum or sucking hard candy and drinking plenty of water may help. Contact your health care provider if the problem does not go away or is severe. What side effects may I notice from receiving this medicine? Side effects that you should report to your doctor or health care professional as soon as possible:  allergic reactions like skin rash, itching or hives, swelling of the face, lips, or tongue  breathing problems  confusion  redness, blistering, peeling or loosening of the skin, including inside the mouth  signs and symptoms of low blood pressure like dizziness; feeling faint or lightheaded, falls; unusually weak or tired  trouble passing urine or change in the amount of urine  yellowing of the eyes or skin Side effects that usually do not require medical attention (report to your doctor or health care professional if they continue or are bothersome):  constipation  dry mouth  nausea, vomiting  tiredness This list may not describe all possible side effects. Call your doctor for medical advice about side effects. You may report side effects to FDA at 1-800-FDA-1088. Where should I keep my medicine? Keep out of the reach of children. This medicine can be abused. Keep your medicine in a safe place to protect it from theft. Do not share this medicine with anyone. Selling or giving away this medicine is dangerous and against the law. Store at room temperature between 15 and 30 degrees C (59 and 86 degrees F). This medicine may cause harm and death if it is taken by other adults, children, or pets.  Return medicine that has not been used to an official disposal site. Contact the DEA at [phone] or your city/county government to find a site. If you cannot return the medicine, flush it down the toilet. Do not use the medicine after the expiration date. NOTE: This sheet is a summary. It may not cover all possible information. If you have questions about this medicine, talk to your doctor, pharmacist, or health care provider.  2021 Elsevier/Gold Standard (2019-04-29 12:25:54)

## 2021-03-03 ENCOUNTER — Encounter (HOSPITAL_COMMUNITY): Payer: Self-pay | Admitting: Urology

## 2021-03-03 LAB — HEMOGLOBIN A1C
Hgb A1c MFr Bld: 6 % — ABNORMAL HIGH (ref 4.8–5.6)
Mean Plasma Glucose: 126 mg/dL

## 2021-03-08 ENCOUNTER — Ambulatory Visit (INDEPENDENT_AMBULATORY_CARE_PROVIDER_SITE_OTHER): Payer: Medicaid Other | Admitting: Urology

## 2021-03-08 ENCOUNTER — Encounter: Payer: Self-pay | Admitting: Urology

## 2021-03-08 ENCOUNTER — Other Ambulatory Visit: Payer: Self-pay

## 2021-03-08 VITALS — BP 122/71 | HR 75

## 2021-03-08 DIAGNOSIS — N200 Calculus of kidney: Secondary | ICD-10-CM

## 2021-03-08 LAB — CALCULI, WITH PHOTOGRAPH (CLINICAL LAB)
Calcium Oxalate Dihydrate: 10 %
Calcium Oxalate Monohydrate: 90 %
Weight Calculi: 171 mg

## 2021-03-08 NOTE — Patient Instructions (Signed)

## 2021-03-08 NOTE — Progress Notes (Signed)

## 2021-03-08 NOTE — Progress Notes (Signed)
03/08/2021 10:56 AM   Gregory Duran Jan 23, 1961 [id number]  Referring provider: No referring provider defined for this encounter.  Nephrolithiasis  HPI: Gregory Duran is a 60yo here for followup for nephrolithiasis. He had right ureteroscopic stone extraction 6/2. He removed his stent POD#3. He had intermittent right flank pain which is improving. No LUTS.    PMH: Past Medical History:  Diagnosis Date  . Anxiety   . Chronic back pain   . Chronic shoulder pain   . Diabetes mellitus (HCC)   . Hepatitis C    Patient states that he does not have hep C.  Will bring test results.  . Hypertension   . Sciatica   . Scoliosis   . Sleep apnea   . Spinal stenosis     Surgical History: Past Surgical History:  Procedure Laterality Date  . COLONOSCOPY WITH PROPOFOL N/A 08/10/2020   Procedure: COLONOSCOPY WITH PROPOFOL;  Surgeon: Dolores Frame, MD;  Location: [facility];  Service: Gastroenterology;  Laterality: N/A;  11:15  . CYSTOSCOPY WITH RETROGRADE PYELOGRAM, URETEROSCOPY AND STENT PLACEMENT Right 03/02/2021   Procedure: CYSTOSCOPY WITH RETROGRADE PYELOGRAM, URETEROSCOPY AND STENT PLACEMENT;  Surgeon: Malen Gauze, MD;  Location: [facility];  Service: Urology;  Laterality: Right;  . ESOPHAGOGASTRODUODENOSCOPY (EGD) WITH PROPOFOL N/A 11/25/2015   Procedure: ESOPHAGOGASTRODUODENOSCOPY (EGD) WITH PROPOFOL;  Surgeon: Malissa Hippo, MD;  Location: [facility];  Service: Endoscopy;  Laterality: N/A;  . HOLMIUM LASER APPLICATION Right 03/02/2021   Procedure: HOLMIUM LASER APPLICATION;  Surgeon: Malen Gauze, MD;  Location: [facility];  Service: Urology;  Laterality: Right;  . KIDNEY STONE SURGERY      Home Medications:  Allergies as of 03/08/2021      Reactions   Fish Allergy Nausea And Vomiting      Medication List       Accurate as of March 08, 2021 10:56 AM. If you have any questions, ask your nurse or doctor.        hydrochlorothiazide 25 MG tablet Commonly  known as: HYDRODIURIL Take 25 mg by mouth daily.   HYDROcodone-acetaminophen 5-325 MG tablet Commonly known as: NORCO/VICODIN Take 1 tablet by mouth every 4 (four) hours as needed for moderate pain.   lisinopril 10 MG tablet Commonly known as: ZESTRIL Take 10 mg by mouth daily.   metFORMIN 500 MG tablet Commonly known as: GLUCOPHAGE Take 500 mg by mouth 2 (two) times daily with a meal.       Allergies:  Allergies  Allergen Reactions  . Fish Allergy Nausea And Vomiting    Family History: Family History  Problem Relation Age of Onset  . Heart disease Mother   . Heart disease Father   . Healthy Sister   . Hypertension Brother   . Hypertension Brother   . Hypertension Brother   . Diabetes Brother     Social History:  reports that he has never smoked. He has never used smokeless tobacco. He reports current alcohol use. He reports that he does not use drugs.  ROS: All other review of systems were reviewed and are negative except what is noted above in HPI  Physical Exam: BP 122/71   Pulse 75   Constitutional:  Alert and oriented, No acute distress. HEENT: [place] AT, moist mucus membranes.  Trachea midline, no masses. Cardiovascular: No clubbing, cyanosis, or edema. Respiratory: Normal respiratory effort, no increased work of breathing. GI: Abdomen is soft, nontender, nondistended, no abdominal masses GU: No CVA tenderness.  Lymph: No cervical or inguinal lymphadenopathy. Skin: No rashes, bruises or suspicious lesions. Neurologic: Grossly intact, no focal deficits, moving all 4 extremities. Psychiatric: Normal mood and affect.  Laboratory Data: Lab Results  Component Value Date   WBC 8.0 11/15/2020   HGB 12.6 (L) 11/15/2020   HCT 36.3 (L) 11/15/2020   MCV 90.1 11/15/2020   PLT 88 (L) 11/15/2020    Lab Results  Component Value Date   CREATININE 0.58 (L) 03/02/2021    No results found for: PSA  No results found for: TESTOSTERONE  Lab Results  Component  Value Date   HGBA1C 6.0 (H) 03/02/2021    Urinalysis    Component Value Date/Time   COLORURINE AMBER (A) 10/09/2020 1220   APPEARANCEUR CLEAR (A) 10/09/2020 1220   APPEARANCEUR Clear 08/31/2020 0851   LABSPEC 1.029 10/09/2020 1220   PHURINE 6.0 10/09/2020 1220   GLUCOSEU 50 (A) 10/09/2020 1220   HGBUR SMALL (A) 10/09/2020 1220   BILIRUBINUR NEGATIVE 10/09/2020 1220   BILIRUBINUR Negative 08/31/2020 0851   KETONESUR 20 (A) 10/09/2020 1220   PROTEINUR 30 (A) 10/09/2020 1220   UROBILINOGEN 0.2 04/15/2014 1543   NITRITE NEGATIVE 10/09/2020 1220   LEUKOCYTESUR NEGATIVE 10/09/2020 1220    Lab Results  Component Value Date   LABMICR Comment 08/31/2020   BACTERIA NONE SEEN 10/09/2020    Pertinent Imaging:  No results found for this or any previous visit.  No results found for this or any previous visit.  No results found for this or any previous visit.  No results found for this or any previous visit.  Results for orders placed during the hospital encounter of 08/19/15  US Renal  Narrative CLINICAL DATA:  Follow-up of previously demonstrated prominence of left renal collecting system ; known kidney stones.  EXAM: RENAL / URINARY TRACT ULTRASOUND COMPLETE  COMPARISON:  Renal ultrasound of March 25, 2015  FINDINGS: Right Kidney:  Length: 12.9 cm. Echogenicity within normal limits. No hydronephrosis visualized. Probable 5 mm stone in a mid to lower pole infundibulum.  Left Kidney:  Length: 13.6 cm. Echogenicity within normal limits. No hydronephrosis visualized. There is a probable stone in the lower pole measuring 4 mm.  Bladder:  Appears normal for degree of bladder distention.  IMPRESSION: Bilateral kidney stones without evidence of obstruction. Previous fullness of the left renal collecting system has resolved.   Electronically Signed By: David  [place] M.D. On: 08/19/2015 13:36  No results found for this or any previous visit.  No results found  for this or any previous visit.  Results for orders placed during the hospital encounter of 10/09/20  CT Renal Stone Study  Narrative CLINICAL DATA:  Pain.  EXAM: CT ABDOMEN AND PELVIS WITHOUT CONTRAST  TECHNIQUE: Multidetector CT imaging of the abdomen and pelvis was performed following the standard protocol without IV contrast.  COMPARISON:  July 28, 2020  FINDINGS: Lower chest: No acute abnormality.  Hepatobiliary: Evaluation for liver masses is limited due the lack of contrast. No discrete masses identified on this unenhanced study. The gallbladder is distended which is new in the interval but there is no adjacent fat stranding. The portal vein could not be assessed.  Pancreas: Unremarkable. No pancreatic ductal dilatation or surrounding inflammatory changes.  Spleen: Normal in size without focal abnormality.  Adrenals/Urinary Tract: Bilateral renal stones are identified. The largest is in a right lower pole calyx measuring 7 mm. No hydronephrosis. Mild bilateral perinephric stranding is nonacute. Ureters are normal with no obstruction or stones. The  bladder is unremarkable.  Stomach/Bowel: Stomach is within normal limits. Appendix appears normal. No evidence of bowel wall thickening, distention, or inflammatory changes.  Vascular/Lymphatic: No significant vascular findings are present. No enlarged abdominal or pelvic lymph nodes.  Reproductive: Prostate is unremarkable.  Other: No abdominal wall hernia or abnormality. No abdominopelvic ascites.  Musculoskeletal: No acute or significant osseous findings.  IMPRESSION: 1. The gallbladder is distended which is new in the interval but there is no adjacent fat stranding. If there is concern for acute cholecystitis, recommend ultrasound for further evaluation. 2. Nonobstructive stones in both kidneys.   Electronically Signed By: Gerome Sam III M.D On: 10/09/2020 14:31   Assessment & Plan:    1.  Nephrolithiasis -RTC 1 month with renal US   No follow-ups on file.  Wilkie Aye, MD  [facility] [place]

## 2021-04-07 ENCOUNTER — Ambulatory Visit (HOSPITAL_COMMUNITY): Payer: Medicaid Other

## 2021-04-14 ENCOUNTER — Other Ambulatory Visit: Payer: Self-pay

## 2021-04-14 ENCOUNTER — Ambulatory Visit (HOSPITAL_COMMUNITY)
Admission: RE | Admit: 2021-04-14 | Discharge: 2021-04-14 | Disposition: A | Discharge status: Home / Self Care | Payer: Medicaid Other | Source: Ambulatory Visit | Attending: Urology | Admitting: Urology

## 2021-04-14 ENCOUNTER — Encounter: Payer: Self-pay | Admitting: Urology

## 2021-04-14 ENCOUNTER — Ambulatory Visit (INDEPENDENT_AMBULATORY_CARE_PROVIDER_SITE_OTHER): Payer: Medicaid Other | Admitting: Urology

## 2021-04-14 VITALS — BP 152/92 | HR 69 | Temp 98.4°F

## 2021-04-14 DIAGNOSIS — N200 Calculus of kidney: Secondary | ICD-10-CM | POA: Diagnosis present

## 2021-04-14 LAB — URINALYSIS, ROUTINE W REFLEX MICROSCOPIC
Bilirubin, UA: NEGATIVE
Glucose, UA: NEGATIVE
Ketones, UA: NEGATIVE
Leukocytes,UA: NEGATIVE
Nitrite, UA: NEGATIVE
Protein,UA: NEGATIVE
RBC, UA: NEGATIVE
Specific Gravity, UA: 1.02 (ref 1.005–1.030)
Urobilinogen, Ur: 0.2 mg/dL (ref 0.2–1.0)
pH, UA: 6 (ref 5.0–7.5)

## 2021-04-14 NOTE — Patient Instructions (Signed)
Textbook of Natural Medicine (5th ed., pp. 1518-1527.e3). [place], [place]: Elsevier.">  Dietary Guidelines to Help Prevent Kidney Stones Kidney stones are deposits of minerals and salts that form inside your kidneys. Your risk of developing kidney stones may be greater depending on your diet, your lifestyle, the medicines you take, and whether you have certain medical conditions. Most people can lower their chances of developing kidney stones by following the instructions below. Your dietitian may give you more specific instructions depending on your overall health and the type of kidney stones youtend to develop. What are tips for following this plan? Reading food labels  Choose foods with "no salt added" or "low-salt" labels. Limit your salt (sodium) intake to less than 1,500 mg a day. Choose foods with calcium for each meal and snack. Try to eat about 300 mg of calcium at each meal. Foods that contain 200-500 mg of calcium a serving include: 8 oz (237 mL) of milk, calcium-fortifiednon-dairy milk, and calcium-fortifiedfruit juice. Calcium-fortified means that calcium has been added to these drinks. 8 oz (237 mL) of kefir, yogurt, and soy yogurt. 4 oz (114 g) of tofu. 1 oz (28 g) of cheese. 1 cup (150 g) of dried figs. 1 cup (91 g) of cooked broccoli. One 3 oz (85 g) can of sardines or mackerel. Most people need 1,000-1,500 mg of calcium a day. Talk to your dietitian abouthow much calcium is recommended for you. Shopping Buy plenty of fresh fruits and vegetables. Most people do not need to avoid fruits and vegetables, even if these foods contain nutrients that may contribute to kidney stones. When shopping for convenience foods, choose: Whole pieces of fruit. Pre-made salads with dressing on the side. Low-fat fruit and yogurt smoothies. Avoid buying frozen meals or prepared deli foods. These can be high in sodium. Look for foods with live cultures, such as yogurt and kefir. Choose high-fiber  grains, such as whole-wheat breads, oat bran, and wheat cereals. Cooking Do not add salt to food when cooking. Place a salt shaker on the table and allow each person to add his or her own salt to taste. Use vegetable protein, such as beans, textured vegetable protein (TVP), or tofu, instead of meat in pasta, casseroles, and soups. Meal planning Eat less salt, if told by your dietitian. To do this: Avoid eating processed or pre-made food. Avoid eating fast food. Eat less animal protein, including cheese, meat, poultry, or fish, if told by your dietitian. To do this: Limit the number of times you have meat, poultry, fish, or cheese each week. Eat a diet free of meat at least 2 days a week. Eat only one serving each day of meat, poultry, fish, or seafood. When you prepare animal protein, cut pieces into small portion sizes. For most meat and fish, one serving is about the size of the palm of your hand. Eat at least five servings of fresh fruits and vegetables each day. To do this: Keep fruits and vegetables on hand for snacks. Eat one piece of fruit or a handful of berries with breakfast. Have a salad and fruit at lunch. Have two kinds of vegetables at dinner. Limit foods that are high in a substance called oxalate. These include: Spinach (cooked), rhubarb, beets, sweet potatoes, and Swiss chard. Peanuts. Potato chips, french fries, and baked potatoes with skin on. Nuts and nut products. Chocolate. If you regularly take a diuretic medicine, make sure to eat at least 1 or 2 servings of fruits or vegetables that are   high in potassium each day. These include: Avocado. Banana. Orange, prune, carrot, or tomato juice. Baked potato. Cabbage. Beans and split peas. Lifestyle  Drink enough fluid to keep your urine pale yellow. This is the most important thing you can do. Spread your fluid intake throughout the day. If you drink alcohol: Limit how much you use to: 0-1 drink a day for women who  are not pregnant. 0-2 drinks a day for men. Be aware of how much alcohol is in your drink. In the [place] one drink equals one 12 oz bottle of beer (355 mL), one 5 oz glass of wine (148 mL), or one 1 oz glass of hard liquor (44 mL). Lose weight if told by your health care provider. Work with your dietitian to find an eating plan and weight loss strategies that work best for you.  General information Talk to your health care provider and dietitian about taking daily supplements. You may be told the following depending on your health and the cause of your kidney stones: Not to take supplements with vitamin C. To take a calcium supplement. To take a daily probiotic supplement. To take other supplements such as magnesium, fish oil, or vitamin B6. Take over-the-counter and prescription medicines only as told by your health care provider. These include supplements. What foods should I limit? Limit your intake of the following foods, or eat them as told by your dietitian. Vegetables Spinach. Rhubarb. Beets. Canned vegetables. Pickles. Olives. Baked potatoeswith skin. Grains Wheat bran. Baked goods. Salted crackers. Cereals high in sugar. Meats and other proteins Nuts. Nut butters. Large portions of meat, poultry, or fish. Salted, precooked,or cured meats, such as sausages, meat loaves, and hot dogs. Dairy Cheese. Beverages Regular soft drinks. Regular vegetable juice. Seasonings and condiments Seasoning blends with salt. Salad dressings. Soy sauce. Ketchup. Barbecue sauce. Other foods Canned soups. Canned pasta sauce. Casseroles. Pizza. Lasagna. Frozen meals.Potato chips. French fries. The items listed above may not be a complete list of foods and beverages you should limit. Contact a dietitian for more information. What foods should I avoid? Talk to your dietitian about specific foods you should avoid based on the typeof kidney stones you have and your overall health. Fruits Grapefruit. The  item listed above may not be a complete list of foods and beverages you should avoid. Contact a dietitian for more information. Summary Kidney stones are deposits of minerals and salts that form inside your kidneys. You can lower your risk of kidney stones by making changes to your diet. The most important thing you can do is drink enough fluid. Drink enough fluid to keep your urine pale yellow. Talk to your dietitian about how much calcium you should have each day, and eat less salt and animal protein as told by your dietitian. This information is not intended to replace advice given to you by your health care provider. Make sure you discuss any questions you have with your healthcare provider. Document Revised: 09/10/2019 Document Reviewed: 09/10/2019 Elsevier Patient Education  2022 [employer] Inc.  

## 2021-04-14 NOTE — Progress Notes (Signed)
04/14/2021 10:04 AM   Gregory Duran 06-Feb-1961 [id number]  Referring provider: No referring provider defined for this encounter.  nephrolithiasis   HPI: Gregory Duran is a 60yo here for followup for nephrolithiasis. No flank pain. NO stone events since last visit. Renal US shows no calculi and no hydronephrosis. No other complaints today   PMH: Past Medical History:  Diagnosis Date   Anxiety    Chronic back pain    Chronic shoulder pain    Diabetes mellitus (HCC)    Hepatitis C    Patient states that he does not have hep C.  Will bring test results.   Hypertension    Sciatica    Scoliosis    Sleep apnea    Spinal stenosis     Surgical History: Past Surgical History:  Procedure Laterality Date   COLONOSCOPY WITH PROPOFOL N/A 08/10/2020   Procedure: COLONOSCOPY WITH PROPOFOL;  Surgeon: Dolores Frame, MD;  Location: [facility];  Service: Gastroenterology;  Laterality: N/A;  11:15   CYSTOSCOPY WITH RETROGRADE PYELOGRAM, URETEROSCOPY AND STENT PLACEMENT Right 03/02/2021   Procedure: CYSTOSCOPY WITH RETROGRADE PYELOGRAM, URETEROSCOPY AND STENT PLACEMENT;  Surgeon: Malen Gauze, MD;  Location: [facility];  Service: Urology;  Laterality: Right;   ESOPHAGOGASTRODUODENOSCOPY (EGD) WITH PROPOFOL N/A 11/25/2015   Procedure: ESOPHAGOGASTRODUODENOSCOPY (EGD) WITH PROPOFOL;  Surgeon: Malissa Hippo, MD;  Location: [facility];  Service: Endoscopy;  Laterality: N/A;   HOLMIUM LASER APPLICATION Right 03/02/2021   Procedure: HOLMIUM LASER APPLICATION;  Surgeon: Malen Gauze, MD;  Location: [facility];  Service: Urology;  Laterality: Right;   KIDNEY STONE SURGERY      Home Medications:  Allergies as of 04/14/2021       Reactions   Fish Allergy Nausea And Vomiting        Medication List        Accurate as of April 14, 2021 10:04 AM. If you have any questions, ask your nurse or doctor.          hydrochlorothiazide 25 MG tablet Commonly known as:  HYDRODIURIL Take 25 mg by mouth daily.   HYDROcodone-acetaminophen 5-325 MG tablet Commonly known as: NORCO/VICODIN Take 1 tablet by mouth every 4 (four) hours as needed for moderate pain.   lisinopril 10 MG tablet Commonly known as: ZESTRIL Take 10 mg by mouth daily.   metFORMIN 500 MG tablet Commonly known as: GLUCOPHAGE Take 500 mg by mouth 2 (two) times daily with a meal.        Allergies:  Allergies  Allergen Reactions   Fish Allergy Nausea And Vomiting    Family History: Family History  Problem Relation Age of Onset   Heart disease Mother    Heart disease Father    Healthy Sister    Hypertension Brother    Hypertension Brother    Hypertension Brother    Diabetes Brother     Social History:  reports that he has never smoked. He has never used smokeless tobacco. He reports current alcohol use. He reports that he does not use drugs.  ROS: All other review of systems were reviewed and are negative except what is noted above in HPI  Physical Exam: BP (!) 152/92   Pulse 69   Temp 98.4 F (36.9 C)   Constitutional:  Alert and oriented, No acute distress. HEENT: [place] AT, moist mucus membranes.  Trachea midline, no masses. Cardiovascular: No clubbing, cyanosis, or edema. Respiratory: Normal respiratory effort, no increased work of breathing. GI:  Abdomen is soft, nontender, nondistended, no abdominal masses GU: No CVA tenderness.  Lymph: No cervical or inguinal lymphadenopathy. Skin: No rashes, bruises or suspicious lesions. Neurologic: Grossly intact, no focal deficits, moving all 4 extremities. Psychiatric: Normal mood and affect.  Laboratory Data: Lab Results  Component Value Date   WBC 8.0 11/15/2020   HGB 12.6 (L) 11/15/2020   HCT 36.3 (L) 11/15/2020   MCV 90.1 11/15/2020   PLT 88 (L) 11/15/2020    Lab Results  Component Value Date   CREATININE 0.58 (L) 03/02/2021    No results found for: PSA  No results found for: TESTOSTERONE  Lab Results   Component Value Date   HGBA1C 6.0 (H) 03/02/2021    Urinalysis    Component Value Date/Time   COLORURINE AMBER (A) 10/09/2020 1220   APPEARANCEUR CLEAR (A) 10/09/2020 1220   APPEARANCEUR Clear 08/31/2020 0851   LABSPEC 1.029 10/09/2020 1220   PHURINE 6.0 10/09/2020 1220   GLUCOSEU 50 (A) 10/09/2020 1220   HGBUR SMALL (A) 10/09/2020 1220   BILIRUBINUR NEGATIVE 10/09/2020 1220   BILIRUBINUR Negative 08/31/2020 0851   KETONESUR 20 (A) 10/09/2020 1220   PROTEINUR 30 (A) 10/09/2020 1220   UROBILINOGEN 0.2 04/15/2014 1543   NITRITE NEGATIVE 10/09/2020 1220   LEUKOCYTESUR NEGATIVE 10/09/2020 1220    Lab Results  Component Value Date   LABMICR Comment 08/31/2020   BACTERIA NONE SEEN 10/09/2020    Pertinent Imaging: Renal US today: Images reviewed and discussed with the patient No results found for this or any previous visit.  No results found for this or any previous visit.  No results found for this or any previous visit.  No results found for this or any previous visit.  Results for orders placed during the hospital encounter of 08/19/15  US Renal  Narrative CLINICAL DATA:  Follow-up of previously demonstrated prominence of left renal collecting system ; known kidney stones.  EXAM: RENAL / URINARY TRACT ULTRASOUND COMPLETE  COMPARISON:  Renal ultrasound of March 25, 2015  FINDINGS: Right Kidney:  Length: 12.9 cm. Echogenicity within normal limits. No hydronephrosis visualized. Probable 5 mm stone in a mid to lower pole infundibulum.  Left Kidney:  Length: 13.6 cm. Echogenicity within normal limits. No hydronephrosis visualized. There is a probable stone in the lower pole measuring 4 mm.  Bladder:  Appears normal for degree of bladder distention.  IMPRESSION: Bilateral kidney stones without evidence of obstruction. Previous fullness of the left renal collecting system has resolved.   Electronically Signed By: David  [place] M.D. On: 08/19/2015  13:36  No results found for this or any previous visit.  No results found for this or any previous visit.  Results for orders placed during the hospital encounter of 10/09/20  CT Renal Stone Study  Narrative CLINICAL DATA:  Pain.  EXAM: CT ABDOMEN AND PELVIS WITHOUT CONTRAST  TECHNIQUE: Multidetector CT imaging of the abdomen and pelvis was performed following the standard protocol without IV contrast.  COMPARISON:  July 28, 2020  FINDINGS: Lower chest: No acute abnormality.  Hepatobiliary: Evaluation for liver masses is limited due the lack of contrast. No discrete masses identified on this unenhanced study. The gallbladder is distended which is new in the interval but there is no adjacent fat stranding. The portal vein could not be assessed.  Pancreas: Unremarkable. No pancreatic ductal dilatation or surrounding inflammatory changes.  Spleen: Normal in size without focal abnormality.  Adrenals/Urinary Tract: Bilateral renal stones are identified. The largest is in a right lower  pole calyx measuring 7 mm. No hydronephrosis. Mild bilateral perinephric stranding is nonacute. Ureters are normal with no obstruction or stones. The bladder is unremarkable.  Stomach/Bowel: Stomach is within normal limits. Appendix appears normal. No evidence of bowel wall thickening, distention, or inflammatory changes.  Vascular/Lymphatic: No significant vascular findings are present. No enlarged abdominal or pelvic lymph nodes.  Reproductive: Prostate is unremarkable.  Other: No abdominal wall hernia or abnormality. No abdominopelvic ascites.  Musculoskeletal: No acute or significant osseous findings.  IMPRESSION: 1. The gallbladder is distended which is new in the interval but there is no adjacent fat stranding. If there is concern for acute cholecystitis, recommend ultrasound for further evaluation. 2. Nonobstructive stones in both kidneys.   Electronically Signed By:  Gerome Sam III M.D On: 10/09/2020 14:31   Assessment & Plan:    1. Nephrolithiasis RTC 6 months with renal US - Urinalysis, Routine w reflex microscopic   No follow-ups on file.  Wilkie Aye, MD  [facility]

## 2021-04-14 NOTE — Progress Notes (Signed)
Urological Symptom Review  Patient is experiencing the following symptoms: Frequent urination Get up at night to urinate Kidney stones   Review of Systems  Gastrointestinal (upper)  : Indigestion/heartburn  Gastrointestinal (lower) : Diarrhea  Constitutional : Weight loss Fatigue  Skin: Negative for skin symptoms  Eyes: Negative for eye symptoms  Ear/Nose/Throat : Negative for Ear/Nose/Throat symptoms  Hematologic/Lymphatic: Negative for Hematologic/Lymphatic symptoms  Cardiovascular : Negative for cardiovascular symptoms  Respiratory : Cough  Endocrine: Negative for endocrine symptoms  Musculoskeletal: Back pain Joint pain  Neurological: Headaches  Psychologic: Depression Anxiety

## 2021-05-30 ENCOUNTER — Encounter (INDEPENDENT_AMBULATORY_CARE_PROVIDER_SITE_OTHER): Payer: Self-pay | Admitting: *Deleted

## 2021-10-18 ENCOUNTER — Other Ambulatory Visit (HOSPITAL_COMMUNITY): Payer: Medicaid Other

## 2021-10-18 ENCOUNTER — Ambulatory Visit: Payer: Medicaid Other | Admitting: Urology

## 2023-02-08 IMAGING — US US RENAL
1 series · 14 of 25 positions shown · non-contrast
Comparison: CT abdomen pelvis dated 07/28/2020.

CLINICAL DATA: 60-year-old male with kidney stones.

EXAM:
RENAL / URINARY TRACT ULTRASOUND COMPLETE

[Series 1: us renal · 14 of 45 slices shown]
[im 1/45]
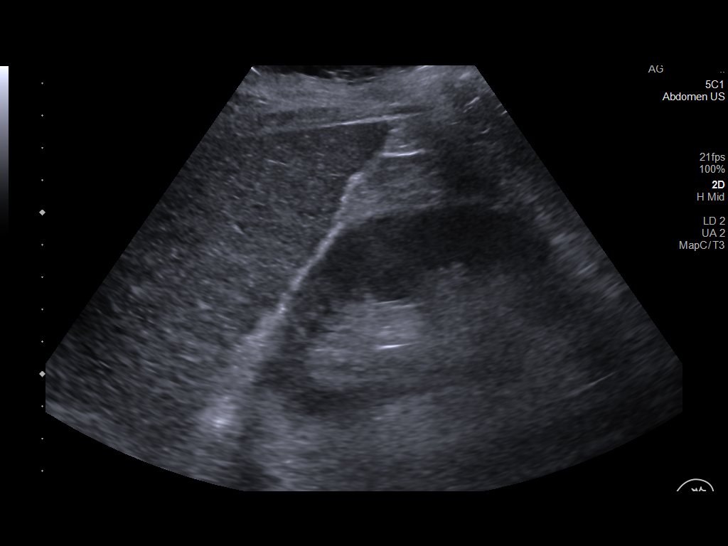
[im 4/45]
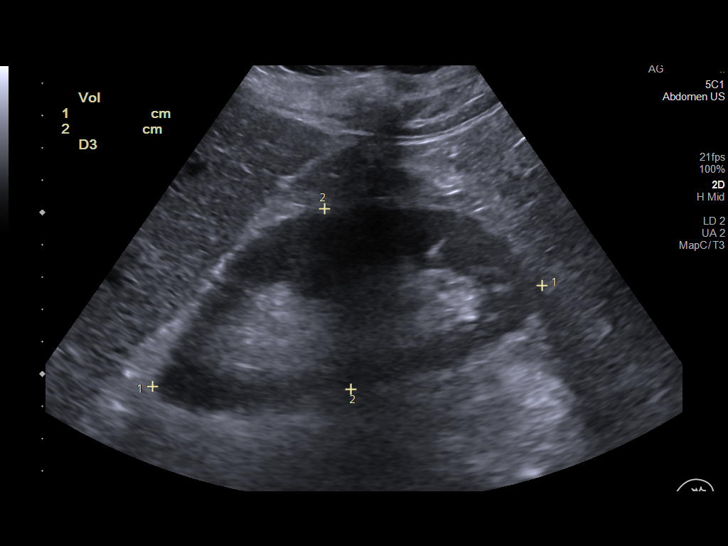
[im 8/45]
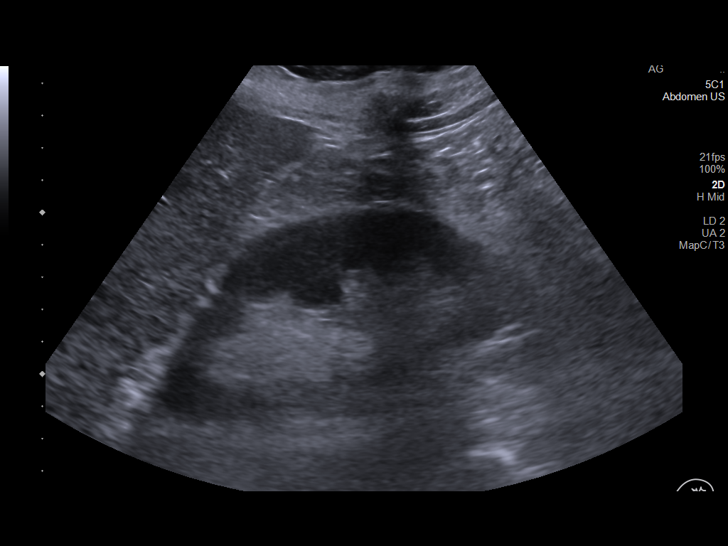
[im 12/45]
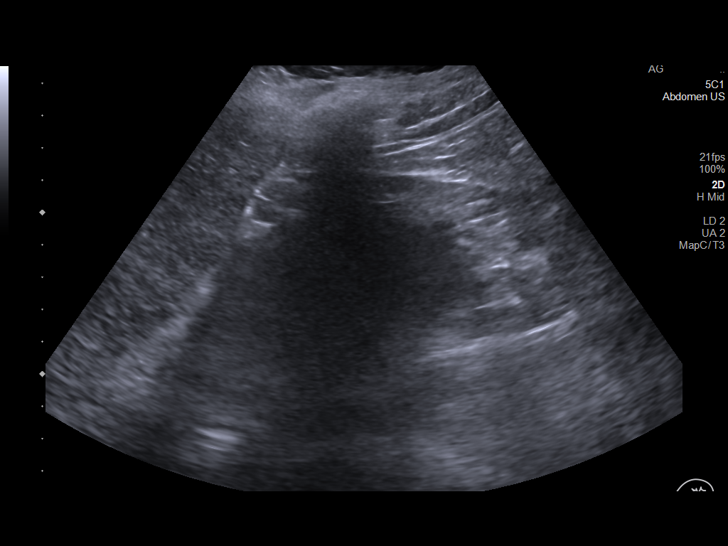
[im 15/45]
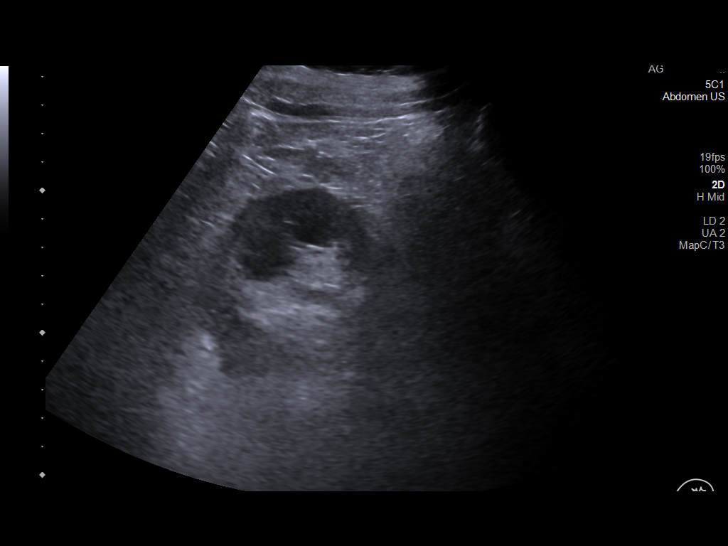
[im 17/45]
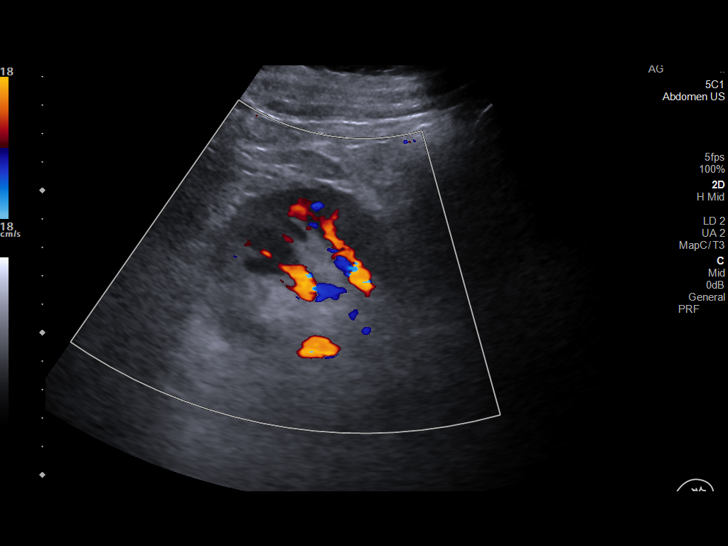
[im 21/45]
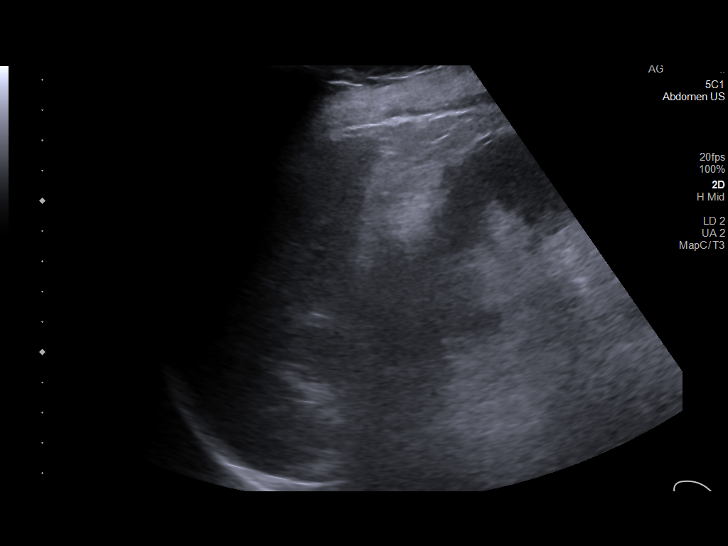
[im 24/45]
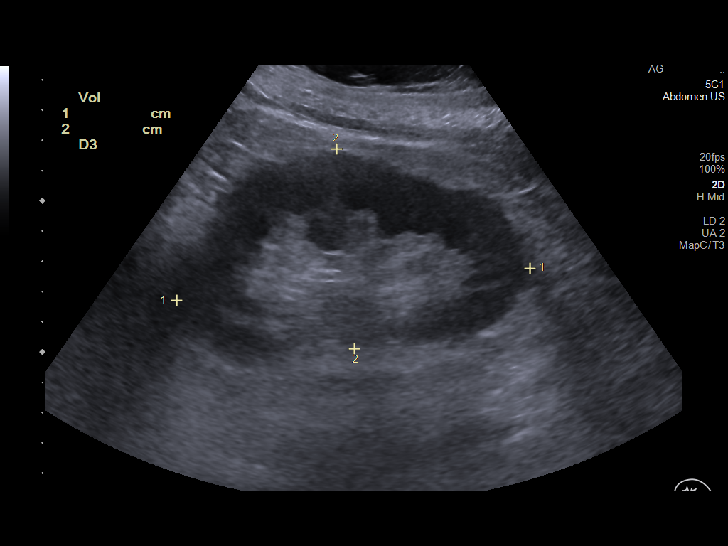
[im 28/45]
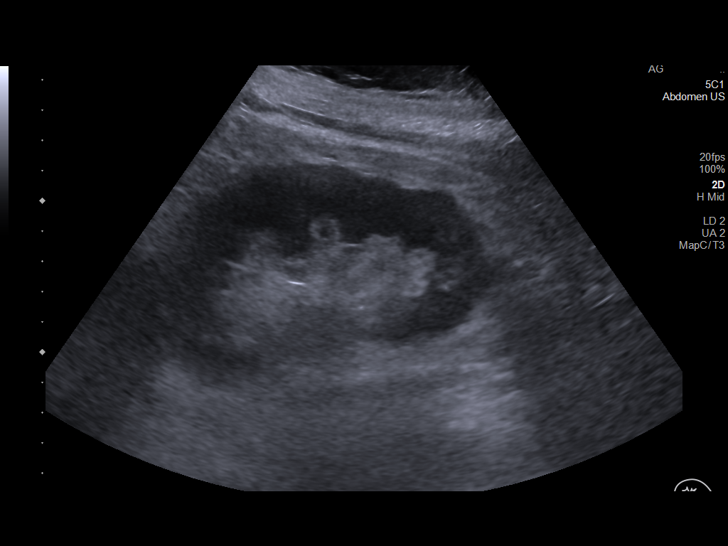
[im 30/45]
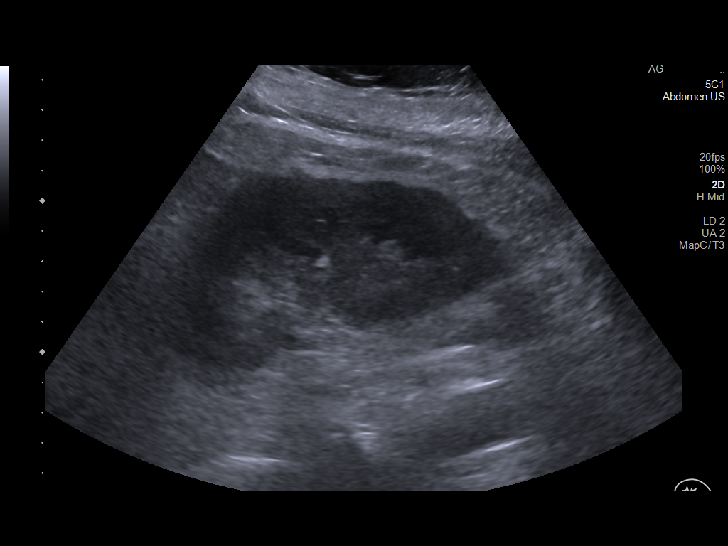
[im 34/45]
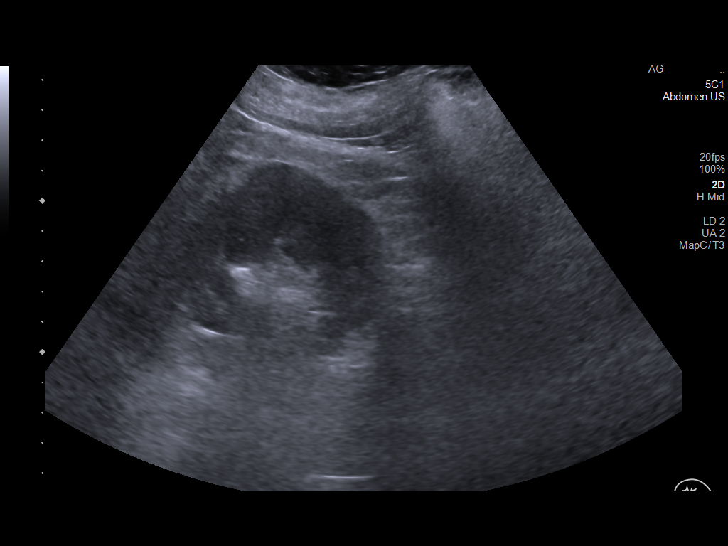
[im 37/45]
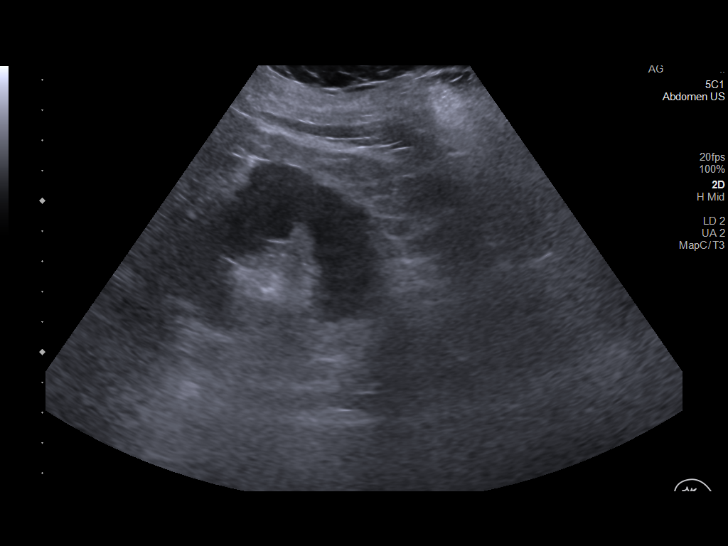
[im 41/45]
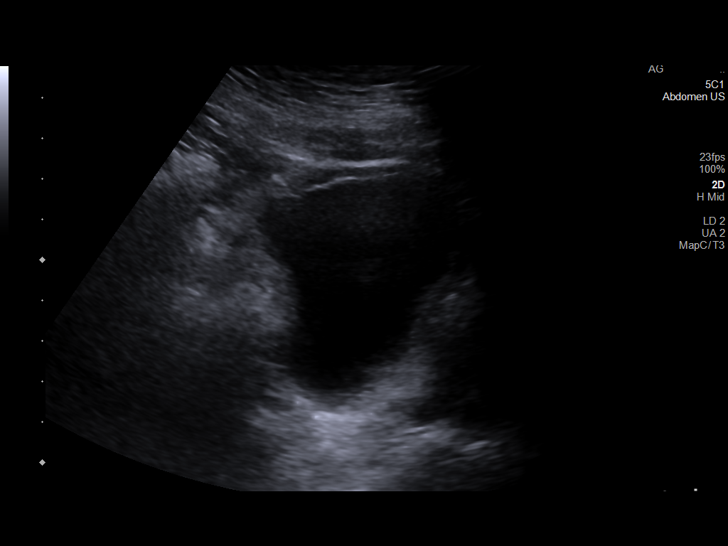
[im 45/45]
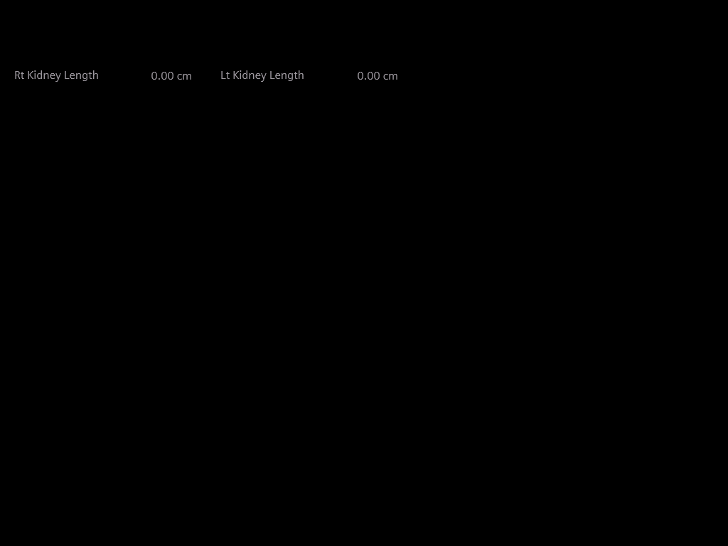

[14 of 25 positions shown; findings below may reference images not displayed]

FINDINGS: Right Kidney:

Renal measurements: 12.5 x 5.7 x 6.7 cm = volume: 248 mL.
Echogenicity within normal limits. No mass or hydronephrosis
visualized.

Left Kidney:

Renal measurements: 11.7 x 6.6 x 6.2 cm = volume: 252 mL.
Echogenicity within normal limits. No mass or hydronephrosis
visualized.

Bladder:

Appears normal for degree of bladder distention. The left ureteral
jet is noted.

Other:

None.
IMPRESSION: Unremarkable renal ultrasound.
